# Patient Record
Sex: Female | Born: 1940 | Race: White | Hispanic: No | Marital: Married | Smoking: Never smoker
Health system: Southern US, Community
[De-identification: ages and names within clinical notes are randomized; demographics above are authoritative.]

## PROBLEM LIST (undated history)

## (undated) DIAGNOSIS — T4145XA Adverse effect of unspecified anesthetic, initial encounter: Secondary | ICD-10-CM

## (undated) DIAGNOSIS — T8859XA Other complications of anesthesia, initial encounter: Secondary | ICD-10-CM

## (undated) DIAGNOSIS — M1990 Unspecified osteoarthritis, unspecified site: Secondary | ICD-10-CM

## (undated) DIAGNOSIS — I10 Essential (primary) hypertension: Secondary | ICD-10-CM

## (undated) DIAGNOSIS — C50919 Malignant neoplasm of unspecified site of unspecified female breast: Secondary | ICD-10-CM

## (undated) DIAGNOSIS — H9190 Unspecified hearing loss, unspecified ear: Secondary | ICD-10-CM

## (undated) DIAGNOSIS — R519 Headache, unspecified: Secondary | ICD-10-CM

## (undated) DIAGNOSIS — M8580 Other specified disorders of bone density and structure, unspecified site: Secondary | ICD-10-CM

## (undated) HISTORY — DX: Other specified disorders of bone density and structure, unspecified site: M85.80

## (undated) HISTORY — PX: PROCTOSCOPY: SHX2266

## (undated) HISTORY — PX: CATARACT EXTRACTION: SUR2

## (undated) HISTORY — DX: Malignant neoplasm of unspecified site of unspecified female breast: C50.919

## (undated) HISTORY — PX: OTHER SURGICAL HISTORY: SHX169

## (undated) HISTORY — PX: HYSTEROSCOPY WITH D & C: SHX1775

## (undated) HISTORY — PX: COLONOSCOPY: SHX174

## (undated) HISTORY — DX: Essential (primary) hypertension: I10

---

## 1998-11-14 ENCOUNTER — Other Ambulatory Visit: Admission: RE | Admit: 1998-11-14 | Discharge: 1998-11-14 | Payer: Self-pay | Admitting: Internal Medicine

## 1999-01-01 ENCOUNTER — Encounter: Payer: Self-pay | Admitting: Internal Medicine

## 1999-01-01 ENCOUNTER — Encounter: Admission: RE | Admit: 1999-01-01 | Discharge: 1999-01-01 | Payer: Self-pay | Admitting: Internal Medicine

## 2000-02-18 ENCOUNTER — Other Ambulatory Visit: Admission: RE | Admit: 2000-02-18 | Discharge: 2000-02-18 | Payer: Self-pay | Admitting: Internal Medicine

## 2000-07-24 ENCOUNTER — Encounter: Admission: RE | Admit: 2000-07-24 | Discharge: 2000-07-24 | Payer: Self-pay | Admitting: Internal Medicine

## 2000-07-24 ENCOUNTER — Encounter: Payer: Self-pay | Admitting: Internal Medicine

## 2001-03-24 ENCOUNTER — Other Ambulatory Visit: Admission: RE | Admit: 2001-03-24 | Discharge: 2001-03-24 | Payer: Self-pay | Admitting: Internal Medicine

## 2002-03-10 ENCOUNTER — Encounter: Payer: Self-pay | Admitting: Internal Medicine

## 2002-03-10 ENCOUNTER — Encounter: Admission: RE | Admit: 2002-03-10 | Discharge: 2002-03-10 | Payer: Self-pay | Admitting: Internal Medicine

## 2002-03-30 ENCOUNTER — Other Ambulatory Visit: Admission: RE | Admit: 2002-03-30 | Discharge: 2002-03-30 | Payer: Self-pay | Admitting: Internal Medicine

## 2003-02-05 HISTORY — PX: BREAST SURGERY: SHX581

## 2003-02-05 LAB — CONVERTED CEMR LAB: Pap Smear: NORMAL

## 2003-03-17 ENCOUNTER — Encounter: Admission: RE | Admit: 2003-03-17 | Discharge: 2003-03-17 | Payer: Self-pay | Admitting: Internal Medicine

## 2003-03-25 ENCOUNTER — Encounter (INDEPENDENT_AMBULATORY_CARE_PROVIDER_SITE_OTHER): Payer: Self-pay | Admitting: Specialist

## 2003-03-25 ENCOUNTER — Encounter: Admission: RE | Admit: 2003-03-25 | Discharge: 2003-03-25 | Payer: Self-pay | Admitting: Internal Medicine

## 2003-03-31 ENCOUNTER — Other Ambulatory Visit: Admission: RE | Admit: 2003-03-31 | Discharge: 2003-03-31 | Payer: Self-pay | Admitting: Internal Medicine

## 2003-04-05 DIAGNOSIS — C50919 Malignant neoplasm of unspecified site of unspecified female breast: Secondary | ICD-10-CM

## 2003-04-05 HISTORY — DX: Malignant neoplasm of unspecified site of unspecified female breast: C50.919

## 2003-04-08 ENCOUNTER — Encounter (HOSPITAL_COMMUNITY): Admission: RE | Admit: 2003-04-08 | Discharge: 2003-07-07 | Payer: Self-pay | Admitting: General Surgery

## 2003-04-15 ENCOUNTER — Encounter: Admission: RE | Admit: 2003-04-15 | Discharge: 2003-04-15 | Payer: Self-pay | Admitting: General Surgery

## 2003-04-18 ENCOUNTER — Encounter: Admission: RE | Admit: 2003-04-18 | Discharge: 2003-04-18 | Payer: Self-pay | Admitting: General Surgery

## 2003-04-18 ENCOUNTER — Ambulatory Visit (HOSPITAL_COMMUNITY): Admission: RE | Admit: 2003-04-18 | Discharge: 2003-04-18 | Payer: Self-pay | Admitting: General Surgery

## 2003-04-18 ENCOUNTER — Ambulatory Visit (HOSPITAL_BASED_OUTPATIENT_CLINIC_OR_DEPARTMENT_OTHER): Admission: RE | Admit: 2003-04-18 | Discharge: 2003-04-18 | Payer: Self-pay | Admitting: General Surgery

## 2003-04-18 ENCOUNTER — Encounter (INDEPENDENT_AMBULATORY_CARE_PROVIDER_SITE_OTHER): Payer: Self-pay | Admitting: Specialist

## 2003-05-10 ENCOUNTER — Ambulatory Visit: Admission: RE | Admit: 2003-05-10 | Discharge: 2003-07-08 | Payer: Self-pay | Admitting: Radiation Oncology

## 2003-05-13 ENCOUNTER — Encounter: Admission: RE | Admit: 2003-05-13 | Discharge: 2003-05-13 | Payer: Self-pay | Admitting: Oncology

## 2003-08-25 ENCOUNTER — Ambulatory Visit: Admission: RE | Admit: 2003-08-25 | Discharge: 2003-08-25 | Payer: Self-pay | Admitting: Radiation Oncology

## 2003-09-22 ENCOUNTER — Ambulatory Visit: Admission: RE | Admit: 2003-09-22 | Discharge: 2003-09-22 | Payer: Self-pay | Admitting: Radiation Oncology

## 2003-12-13 ENCOUNTER — Ambulatory Visit: Payer: Self-pay | Admitting: Oncology

## 2004-03-19 ENCOUNTER — Encounter: Admission: RE | Admit: 2004-03-19 | Discharge: 2004-03-19 | Payer: Self-pay | Admitting: Oncology

## 2004-06-13 ENCOUNTER — Ambulatory Visit: Payer: Self-pay | Admitting: Oncology

## 2004-06-30 ENCOUNTER — Emergency Department (HOSPITAL_COMMUNITY): Admission: EM | Admit: 2004-06-30 | Discharge: 2004-06-30 | Payer: Self-pay | Admitting: Emergency Medicine

## 2004-12-12 ENCOUNTER — Ambulatory Visit: Payer: Self-pay | Admitting: Oncology

## 2005-01-16 ENCOUNTER — Encounter: Payer: Self-pay | Admitting: Family Medicine

## 2005-02-04 LAB — CONVERTED CEMR LAB: Pap Smear: NORMAL

## 2005-03-21 ENCOUNTER — Encounter: Admission: RE | Admit: 2005-03-21 | Discharge: 2005-03-21 | Payer: Self-pay | Admitting: Oncology

## 2005-05-15 ENCOUNTER — Other Ambulatory Visit: Admission: RE | Admit: 2005-05-15 | Discharge: 2005-05-15 | Payer: Self-pay | Admitting: Internal Medicine

## 2005-05-16 ENCOUNTER — Encounter: Admission: RE | Admit: 2005-05-16 | Discharge: 2005-05-16 | Payer: Self-pay | Admitting: Internal Medicine

## 2005-05-17 ENCOUNTER — Encounter: Admission: RE | Admit: 2005-05-17 | Discharge: 2005-05-17 | Payer: Self-pay | Admitting: Internal Medicine

## 2005-06-11 ENCOUNTER — Ambulatory Visit: Payer: Self-pay | Admitting: Oncology

## 2005-06-14 LAB — CBC WITH DIFFERENTIAL/PLATELET
BASO%: 0.4 % (ref 0.0–2.0)
Basophils Absolute: 0 10*3/uL (ref 0.0–0.1)
Eosinophils Absolute: 0.2 10*3/uL (ref 0.0–0.5)
HCT: 42 % (ref 34.8–46.6)
HGB: 14.1 g/dL (ref 11.6–15.9)
LYMPH%: 35.8 % (ref 14.0–48.0)
MCHC: 33.5 g/dL (ref 32.0–36.0)
MONO#: 0.5 10*3/uL (ref 0.1–0.9)
NEUT%: 51.6 % (ref 39.6–76.8)
Platelets: 220 10*3/uL (ref 145–400)
WBC: 5.6 10*3/uL (ref 3.9–10.0)
lymph#: 2 10*3/uL (ref 0.9–3.3)

## 2005-06-14 LAB — COMPREHENSIVE METABOLIC PANEL
ALT: 21 U/L (ref 0–40)
AST: 20 U/L (ref 0–37)
Albumin: 4.2 g/dL (ref 3.5–5.2)
BUN: 22 mg/dL (ref 6–23)
Calcium: 9.3 mg/dL (ref 8.4–10.5)
Chloride: 108 mEq/L (ref 96–112)
Potassium: 5.2 mEq/L (ref 3.5–5.3)

## 2005-06-18 ENCOUNTER — Encounter: Admission: RE | Admit: 2005-06-18 | Discharge: 2005-06-18 | Payer: Self-pay | Admitting: Oncology

## 2005-06-21 ENCOUNTER — Ambulatory Visit: Payer: Self-pay | Admitting: Internal Medicine

## 2005-06-24 ENCOUNTER — Ambulatory Visit: Payer: Self-pay | Admitting: Internal Medicine

## 2005-07-26 ENCOUNTER — Encounter (INDEPENDENT_AMBULATORY_CARE_PROVIDER_SITE_OTHER): Payer: Self-pay | Admitting: Specialist

## 2005-07-26 ENCOUNTER — Ambulatory Visit (HOSPITAL_COMMUNITY): Admission: RE | Admit: 2005-07-26 | Discharge: 2005-07-26 | Payer: Self-pay | Admitting: Obstetrics and Gynecology

## 2005-08-21 ENCOUNTER — Ambulatory Visit: Admission: RE | Admit: 2005-08-21 | Discharge: 2005-08-21 | Payer: Self-pay | Admitting: Gynecologic Oncology

## 2005-10-08 ENCOUNTER — Ambulatory Visit: Admission: RE | Admit: 2005-10-08 | Discharge: 2005-10-08 | Payer: Self-pay | Admitting: Gynecologic Oncology

## 2005-10-10 ENCOUNTER — Ambulatory Visit (HOSPITAL_COMMUNITY): Admission: RE | Admit: 2005-10-10 | Discharge: 2005-10-10 | Payer: Self-pay | Admitting: Gynecologic Oncology

## 2005-12-06 ENCOUNTER — Ambulatory Visit: Payer: Self-pay | Admitting: Oncology

## 2005-12-10 LAB — COMPREHENSIVE METABOLIC PANEL
ALT: 17 U/L (ref 0–35)
AST: 20 U/L (ref 0–37)
CO2: 25 mEq/L (ref 19–32)
Chloride: 106 mEq/L (ref 96–112)
Sodium: 140 mEq/L (ref 135–145)
Total Bilirubin: 0.5 mg/dL (ref 0.3–1.2)
Total Protein: 6.5 g/dL (ref 6.0–8.3)

## 2005-12-10 LAB — CBC WITH DIFFERENTIAL/PLATELET
BASO%: 0.7 % (ref 0.0–2.0)
EOS%: 2 % (ref 0.0–7.0)
MCH: 29.6 pg (ref 26.0–34.0)
MCHC: 34.5 g/dL (ref 32.0–36.0)
MONO#: 0.5 10*3/uL (ref 0.1–0.9)
RBC: 4.78 10*6/uL (ref 3.70–5.32)
WBC: 6.2 10*3/uL (ref 3.9–10.0)
lymph#: 1.2 10*3/uL (ref 0.9–3.3)

## 2005-12-10 LAB — CANCER ANTIGEN 27.29: CA 27.29: 27 U/mL (ref 0–39)

## 2006-03-24 ENCOUNTER — Encounter: Admission: RE | Admit: 2006-03-24 | Discharge: 2006-03-24 | Payer: Self-pay | Admitting: Oncology

## 2006-06-12 ENCOUNTER — Ambulatory Visit: Payer: Self-pay | Admitting: Oncology

## 2006-06-16 LAB — CBC WITH DIFFERENTIAL/PLATELET
BASO%: 0.5 % (ref 0.0–2.0)
EOS%: 1.9 % (ref 0.0–7.0)
HGB: 14.2 g/dL (ref 11.6–15.9)
MCH: 29.1 pg (ref 26.0–34.0)
MCHC: 34.7 g/dL (ref 32.0–36.0)
RBC: 4.86 10*6/uL (ref 3.70–5.32)
RDW: 13.2 % (ref 11.3–14.5)
lymph#: 1.4 10*3/uL (ref 0.9–3.3)

## 2006-06-16 LAB — COMPREHENSIVE METABOLIC PANEL
ALT: 17 U/L (ref 0–35)
AST: 21 U/L (ref 0–37)
Albumin: 4.2 g/dL (ref 3.5–5.2)
Calcium: 9.4 mg/dL (ref 8.4–10.5)
Chloride: 105 mEq/L (ref 96–112)
Potassium: 4.2 mEq/L (ref 3.5–5.3)
Sodium: 141 mEq/L (ref 135–145)

## 2006-10-10 ENCOUNTER — Ambulatory Visit: Payer: Self-pay | Admitting: Family Medicine

## 2006-10-10 DIAGNOSIS — I10 Essential (primary) hypertension: Secondary | ICD-10-CM | POA: Insufficient documentation

## 2006-10-10 DIAGNOSIS — Z853 Personal history of malignant neoplasm of breast: Secondary | ICD-10-CM

## 2006-10-10 DIAGNOSIS — K644 Residual hemorrhoidal skin tags: Secondary | ICD-10-CM | POA: Insufficient documentation

## 2006-10-10 DIAGNOSIS — M8580 Other specified disorders of bone density and structure, unspecified site: Secondary | ICD-10-CM

## 2006-10-16 ENCOUNTER — Encounter: Payer: Self-pay | Admitting: Family Medicine

## 2006-12-01 ENCOUNTER — Telehealth: Payer: Self-pay | Admitting: Family Medicine

## 2006-12-05 ENCOUNTER — Ambulatory Visit: Payer: Self-pay | Admitting: Oncology

## 2006-12-09 ENCOUNTER — Encounter: Admission: RE | Admit: 2006-12-09 | Discharge: 2006-12-09 | Payer: Self-pay | Admitting: Oncology

## 2006-12-09 LAB — CBC WITH DIFFERENTIAL/PLATELET
BASO%: 0.6 % (ref 0.0–2.0)
EOS%: 1.4 % (ref 0.0–7.0)
MCH: 29.7 pg (ref 26.0–34.0)
MCV: 84.6 fL (ref 81.0–101.0)
MONO%: 6.7 % (ref 0.0–13.0)
NEUT#: 5.5 10*3/uL (ref 1.5–6.5)
RBC: 4.66 10*6/uL (ref 3.70–5.32)
RDW: 12.9 % (ref 11.3–14.5)

## 2006-12-09 LAB — COMPREHENSIVE METABOLIC PANEL
AST: 26 U/L (ref 0–37)
Albumin: 4 g/dL (ref 3.5–5.2)
Alkaline Phosphatase: 45 U/L (ref 39–117)
Potassium: 3.9 mEq/L (ref 3.5–5.3)
Sodium: 141 mEq/L (ref 135–145)
Total Protein: 6.6 g/dL (ref 6.0–8.3)

## 2006-12-15 ENCOUNTER — Encounter: Payer: Self-pay | Admitting: Family Medicine

## 2007-02-17 ENCOUNTER — Ambulatory Visit: Payer: Self-pay | Admitting: Family Medicine

## 2007-02-24 DIAGNOSIS — E7800 Pure hypercholesterolemia, unspecified: Secondary | ICD-10-CM | POA: Insufficient documentation

## 2007-02-24 LAB — CONVERTED CEMR LAB
ALT: 34 units/L (ref 0–35)
AST: 32 units/L (ref 0–37)
Bilirubin, Direct: 0.1 mg/dL (ref 0.0–0.3)
CO2: 30 meq/L (ref 19–32)
Calcium: 9.2 mg/dL (ref 8.4–10.5)
Chloride: 106 meq/L (ref 96–112)
Cholesterol: 252 mg/dL (ref 0–200)
Creatinine, Ser: 0.7 mg/dL (ref 0.4–1.2)
Direct LDL: 179.9 mg/dL
GFR calc non Af Amer: 88 mL/min
Glucose, Bld: 100 mg/dL — ABNORMAL HIGH (ref 70–99)
Sodium: 143 meq/L (ref 135–145)
Total Bilirubin: 0.9 mg/dL (ref 0.3–1.2)
Total CHOL/HDL Ratio: 5.2
Total Protein: 6.6 g/dL (ref 6.0–8.3)

## 2007-03-23 ENCOUNTER — Ambulatory Visit: Payer: Self-pay | Admitting: Family Medicine

## 2007-03-24 ENCOUNTER — Ambulatory Visit: Payer: Self-pay | Admitting: Family Medicine

## 2007-03-26 ENCOUNTER — Encounter: Admission: RE | Admit: 2007-03-26 | Discharge: 2007-03-26 | Payer: Self-pay | Admitting: Oncology

## 2007-04-02 ENCOUNTER — Encounter: Admission: RE | Admit: 2007-04-02 | Discharge: 2007-04-02 | Payer: Self-pay | Admitting: Oncology

## 2007-04-21 ENCOUNTER — Ambulatory Visit: Payer: Self-pay | Admitting: Family Medicine

## 2007-04-21 ENCOUNTER — Telehealth: Payer: Self-pay | Admitting: Family Medicine

## 2007-04-21 DIAGNOSIS — N390 Urinary tract infection, site not specified: Secondary | ICD-10-CM

## 2007-04-21 LAB — CONVERTED CEMR LAB
Nitrite: NEGATIVE
Specific Gravity, Urine: 1.01
Urobilinogen, UA: 0.2
pH: 6

## 2007-05-05 ENCOUNTER — Ambulatory Visit: Payer: Self-pay | Admitting: Family Medicine

## 2007-05-05 LAB — CONVERTED CEMR LAB
Nitrite: NEGATIVE
Specific Gravity, Urine: 1.02
Urobilinogen, UA: 0.2
pH: 6.5

## 2007-05-12 ENCOUNTER — Ambulatory Visit: Payer: Self-pay | Admitting: Family Medicine

## 2007-05-18 ENCOUNTER — Ambulatory Visit: Payer: Self-pay | Admitting: Family Medicine

## 2007-05-18 DIAGNOSIS — N9089 Other specified noninflammatory disorders of vulva and perineum: Secondary | ICD-10-CM | POA: Insufficient documentation

## 2007-05-20 LAB — CONVERTED CEMR LAB
Cholesterol: 204 mg/dL (ref 0–200)
HDL: 37.8 mg/dL — ABNORMAL LOW (ref >39.0)
Triglycerides: 126 mg/dL (ref 0–149)
VLDL: 25 mg/dL (ref 0–40)

## 2007-05-27 ENCOUNTER — Ambulatory Visit: Payer: Self-pay | Admitting: Family Medicine

## 2007-05-27 LAB — CONVERTED CEMR LAB: Cholesterol, target level: 200 mg/dL

## 2007-06-22 ENCOUNTER — Ambulatory Visit: Payer: Self-pay | Admitting: Oncology

## 2007-07-02 ENCOUNTER — Encounter: Payer: Self-pay | Admitting: Family Medicine

## 2007-07-14 ENCOUNTER — Ambulatory Visit: Payer: Self-pay | Admitting: Family Medicine

## 2007-07-29 ENCOUNTER — Telehealth (INDEPENDENT_AMBULATORY_CARE_PROVIDER_SITE_OTHER): Payer: Self-pay | Admitting: *Deleted

## 2007-07-29 ENCOUNTER — Telehealth: Payer: Self-pay | Admitting: Internal Medicine

## 2007-07-31 ENCOUNTER — Telehealth: Payer: Self-pay | Admitting: Internal Medicine

## 2007-08-05 LAB — CONVERTED CEMR LAB: Pap Smear: NORMAL

## 2007-09-09 DIAGNOSIS — Z853 Personal history of malignant neoplasm of breast: Secondary | ICD-10-CM

## 2007-09-14 ENCOUNTER — Ambulatory Visit: Payer: Self-pay | Admitting: Internal Medicine

## 2007-09-14 DIAGNOSIS — R195 Other fecal abnormalities: Secondary | ICD-10-CM

## 2007-09-14 DIAGNOSIS — N824 Other female intestinal-genital tract fistulae: Secondary | ICD-10-CM

## 2007-10-19 ENCOUNTER — Telehealth: Payer: Self-pay | Admitting: Family Medicine

## 2007-10-21 ENCOUNTER — Ambulatory Visit: Admission: RE | Admit: 2007-10-21 | Discharge: 2007-10-21 | Payer: Self-pay | Admitting: Gynecology

## 2007-10-21 ENCOUNTER — Ambulatory Visit (HOSPITAL_COMMUNITY): Admission: RE | Admit: 2007-10-21 | Discharge: 2007-10-21 | Payer: Self-pay | Admitting: Gynecology

## 2008-01-04 ENCOUNTER — Ambulatory Visit: Payer: Self-pay | Admitting: Oncology

## 2008-01-06 ENCOUNTER — Encounter: Payer: Self-pay | Admitting: Family Medicine

## 2008-01-06 LAB — CBC WITH DIFFERENTIAL/PLATELET
Basophils Absolute: 0 10*3/uL (ref 0.0–0.1)
Eosinophils Absolute: 0.2 10*3/uL (ref 0.0–0.5)
HGB: 13.6 g/dL (ref 11.6–15.9)
LYMPH%: 23.7 % (ref 14.0–48.0)
MCV: 87.2 fL (ref 81.0–101.0)
MONO#: 0.6 10*3/uL (ref 0.1–0.9)
MONO%: 7.1 % (ref 0.0–13.0)
NEUT#: 5.3 10*3/uL (ref 1.5–6.5)
Platelets: 190 10*3/uL (ref 145–400)
RDW: 12.8 % (ref 11.3–14.5)
WBC: 8 10*3/uL (ref 3.9–10.0)

## 2008-01-07 LAB — COMPREHENSIVE METABOLIC PANEL
Albumin: 4.2 g/dL (ref 3.5–5.2)
Alkaline Phosphatase: 43 U/L (ref 39–117)
BUN: 16 mg/dL (ref 6–23)
CO2: 26 mEq/L (ref 19–32)
Glucose, Bld: 97 mg/dL (ref 70–99)
Potassium: 4 mEq/L (ref 3.5–5.3)
Sodium: 139 mEq/L (ref 135–145)
Total Protein: 6.6 g/dL (ref 6.0–8.3)

## 2008-01-07 LAB — LACTATE DEHYDROGENASE: LDH: 183 U/L (ref 94–250)

## 2008-01-07 LAB — VITAMIN D 25 HYDROXY (VIT D DEFICIENCY, FRACTURES): Vit D, 25-Hydroxy: 45 ng/mL (ref 30–89)

## 2008-01-07 LAB — CANCER ANTIGEN 27.29: CA 27.29: 25 U/mL (ref 0–39)

## 2008-03-28 ENCOUNTER — Encounter: Admission: RE | Admit: 2008-03-28 | Discharge: 2008-03-28 | Payer: Self-pay | Admitting: Oncology

## 2008-07-19 ENCOUNTER — Ambulatory Visit: Payer: Self-pay | Admitting: Oncology

## 2008-07-21 LAB — CBC WITH DIFFERENTIAL/PLATELET
BASO%: 0.3 % (ref 0.0–2.0)
Basophils Absolute: 0 10*3/uL (ref 0.0–0.1)
Eosinophils Absolute: 0.1 10*3/uL (ref 0.0–0.5)
HCT: 39 % (ref 34.8–46.6)
HGB: 13.8 g/dL (ref 11.6–15.9)
MONO#: 0.6 10*3/uL (ref 0.1–0.9)
NEUT#: 4.4 10*3/uL (ref 1.5–6.5)
NEUT%: 59.7 % (ref 38.4–76.8)
WBC: 7.3 10*3/uL (ref 3.9–10.3)
lymph#: 2.2 10*3/uL (ref 0.9–3.3)

## 2008-07-22 LAB — COMPREHENSIVE METABOLIC PANEL
ALT: 15 U/L (ref 0–35)
CO2: 25 mEq/L (ref 19–32)
Calcium: 9.1 mg/dL (ref 8.4–10.5)
Chloride: 106 mEq/L (ref 96–112)
Creatinine, Ser: 0.81 mg/dL (ref 0.40–1.20)

## 2008-07-22 LAB — LACTATE DEHYDROGENASE: LDH: 167 U/L (ref 94–250)

## 2008-07-26 ENCOUNTER — Encounter: Payer: Self-pay | Admitting: Family Medicine

## 2008-08-02 ENCOUNTER — Telehealth (INDEPENDENT_AMBULATORY_CARE_PROVIDER_SITE_OTHER): Payer: Self-pay | Admitting: *Deleted

## 2008-08-02 ENCOUNTER — Ambulatory Visit: Payer: Self-pay | Admitting: Family Medicine

## 2008-08-02 DIAGNOSIS — R300 Dysuria: Secondary | ICD-10-CM

## 2008-08-02 LAB — CONVERTED CEMR LAB
Bilirubin Urine: NEGATIVE
Glucose, Urine, Semiquant: NEGATIVE
Ketones, urine, test strip: NEGATIVE
Protein, U semiquant: NEGATIVE
Specific Gravity, Urine: 1.015

## 2008-08-12 ENCOUNTER — Ambulatory Visit: Payer: Self-pay | Admitting: Family Medicine

## 2008-08-16 LAB — CONVERTED CEMR LAB: Vit D, 25-Hydroxy: 43 ng/mL (ref 30–89)

## 2008-08-19 LAB — CONVERTED CEMR LAB
BUN: 18 mg/dL (ref 6–23)
Cholesterol: 209 mg/dL — ABNORMAL HIGH (ref 0–200)
Creatinine, Ser: 0.8 mg/dL (ref 0.4–1.2)
Direct LDL: 148.8 mg/dL
GFR calc non Af Amer: 75.3 mL/min (ref >60)
HDL: 48.5 mg/dL (ref >39.00)
Potassium: 4 meq/L (ref 3.5–5.1)
Total Bilirubin: 1 mg/dL (ref 0.3–1.2)
Total CHOL/HDL Ratio: 4
VLDL: 15.6 mg/dL (ref 0.0–40.0)

## 2008-11-14 ENCOUNTER — Ambulatory Visit: Payer: Self-pay | Admitting: Family Medicine

## 2008-11-15 LAB — CONVERTED CEMR LAB
Cholesterol: 193 mg/dL (ref 0–200)
HDL: 45.2 mg/dL (ref >39.00)
Triglycerides: 109 mg/dL (ref 0.0–149.0)

## 2008-12-12 ENCOUNTER — Encounter: Admission: RE | Admit: 2008-12-12 | Discharge: 2008-12-12 | Payer: Self-pay | Admitting: Oncology

## 2009-03-16 LAB — HM MAMMOGRAPHY: HM Mammogram: NORMAL

## 2009-03-29 ENCOUNTER — Encounter: Admission: RE | Admit: 2009-03-29 | Discharge: 2009-03-29 | Payer: Self-pay | Admitting: Oncology

## 2009-05-15 ENCOUNTER — Telehealth: Payer: Self-pay | Admitting: Family Medicine

## 2009-06-08 ENCOUNTER — Telehealth: Payer: Self-pay | Admitting: Family Medicine

## 2009-06-09 ENCOUNTER — Ambulatory Visit: Payer: Self-pay | Admitting: Family Medicine

## 2009-06-16 DIAGNOSIS — R740 Nonspecific elevation of levels of transaminase and lactic acid dehydrogenase [LDH]: Secondary | ICD-10-CM

## 2009-06-16 LAB — CONVERTED CEMR LAB
ALT: 54 units/L — ABNORMAL HIGH (ref 0–35)
Direct LDL: 142.2 mg/dL
HDL: 51.9 mg/dL (ref >39.00)
Total CHOL/HDL Ratio: 4
Triglycerides: 75 mg/dL (ref 0.0–149.0)
VLDL: 15 mg/dL (ref 0.0–40.0)

## 2009-07-12 ENCOUNTER — Ambulatory Visit: Payer: Self-pay | Admitting: Family Medicine

## 2009-07-17 ENCOUNTER — Ambulatory Visit: Payer: Self-pay | Admitting: Oncology

## 2009-07-19 LAB — CBC WITH DIFFERENTIAL/PLATELET
BASO%: 0.4 % (ref 0.0–2.0)
LYMPH%: 25.4 % (ref 14.0–49.7)
MCHC: 34.5 g/dL (ref 31.5–36.0)
MONO#: 0.5 10*3/uL (ref 0.1–0.9)
MONO%: 7 % (ref 0.0–14.0)
Platelets: 172 10*3/uL (ref 145–400)
RBC: 4.51 10*6/uL (ref 3.70–5.45)
RDW: 13 % (ref 11.2–14.5)
WBC: 7.6 10*3/uL (ref 3.9–10.3)

## 2009-07-20 LAB — COMPREHENSIVE METABOLIC PANEL
ALT: 14 U/L (ref 0–35)
Alkaline Phosphatase: 47 U/L (ref 39–117)
Sodium: 140 mEq/L (ref 135–145)
Total Bilirubin: 0.4 mg/dL (ref 0.3–1.2)
Total Protein: 6.3 g/dL (ref 6.0–8.3)

## 2009-08-02 ENCOUNTER — Ambulatory Visit: Payer: Self-pay | Admitting: Family Medicine

## 2009-08-09 LAB — CONVERTED CEMR LAB
AST: 26 units/L (ref 0–37)
Albumin: 4 g/dL (ref 3.5–5.2)
Alkaline Phosphatase: 40 units/L (ref 39–117)
HCV Ab: NEGATIVE
Total Protein: 6.7 g/dL (ref 6.0–8.3)

## 2009-09-08 ENCOUNTER — Ambulatory Visit: Payer: Self-pay | Admitting: Oncology

## 2009-09-12 ENCOUNTER — Encounter: Payer: Self-pay | Admitting: Family Medicine

## 2010-02-24 ENCOUNTER — Other Ambulatory Visit: Payer: Self-pay | Admitting: Oncology

## 2010-02-24 DIAGNOSIS — Z9889 Other specified postprocedural states: Secondary | ICD-10-CM

## 2010-03-08 NOTE — Letter (Signed)
Summary: [facility]  [facility]   Imported By: Lanelle Bal 10/17/2009 12:50:01  _____________________________________________________________________  External Attachment:    Type:   Image     Comment:   External Document

## 2010-03-08 NOTE — Assessment & Plan Note (Signed)
Summary: CPX   Vital Signs:  Patient profile:   70 year old female Height:      61.25 inches Weight:      116.8 pounds BMI:     21.97 Temp:     97.9 degrees F oral Pulse rate:   76 / minute Pulse rhythm:   regular BP sitting:   110 / 78  (left arm) Cuff size:   regular  Vitals Entered By: Benny Lennert CMA Duncan Dull) (July 12, 2009 8:09 AM)  History of Present Illness: Chief complaint chronic health management  Elevated liver transaminases, mild:  Was on OTC  statin medicaiton.  Minimal alcohol,   Needs refills of BP medicaiton.  Has had breast exam at Dr. Bernadette Hoit....has appt this month.  6 years breast cancer free.   Nml EKG in 2009.   Hypertension History:      She denies headache, chest pain, palpitations, dyspnea with exertion, peripheral edema, syncope, and side effects from treatment.  Well controlled at home on atenolol when she takes it.  112/68-141/80.        Positive major cardiovascular risk factors include female age 79 years old or older, hyperlipidemia, and hypertension.  Negative major cardiovascular risk factors include non-tobacco-user status.    Lipid Management History:      Positive NCEP/ATP III risk factors include female age 91 years old or older and hypertension.  Negative NCEP/ATP III risk factors include non-tobacco-user status.        The patient states that she knows about the "Therapeutic Lifestyle Change" diet.  Her compliance with the TLC diet is fair.  The patient expresses understanding of adjunctive measures for cholesterol lowering.  Adjunctive measures started by the patient include aerobic exercise, fiber, omega-3 supplements, limit alcohol consumpton, and weight reduction.  She expresses no side effects from her lipid-lowering medication.  The patient denies any symptoms to suggest myopathy or liver disease.  Comments: HAs not been taking red yeast rice regualrly anymore, not exercsiing and eating as well as she should. .    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 1 1/2  By Mouth Once Daily 2)  Adult Aspirin Ec Low Strength 81 Mg  Tbec (Aspirin) .Marland Kitchen.. 1 By Mouth Once Daily 3)  Fish Oil   Oil (Fish Oil) 1700 Mg. Marland Kitchen... 1 By Mouth Two Times A Day 4)  Multivitamins   Tabs (Multiple Vitamin) .Marland Kitchen.. 1 By Mouth Once Daily Q 5)  Caltrate 600+d 600-400 Mg-Unit  Tabs (Calcium Carbonate-Vitamin D) .Marland Kitchen.. 1 By Mouth Two Times A Day 6)  Vitamin D 400 Unit  Tabs (Cholecalciferol) .... Take 1 Tablet By Mouth Two Times A Day 7)  Turmeric Curcumin   Caps (Misc Natural Products) .... Take 1 Capsule By Mouth Two Times A Day 8)  Anamantle Hc 3-0.5 %  Crea (Lidocaine-Hydrocortisone Ace) .... Use Twice Daily For Hemorrhoidal Pain. 9)  Alendronate Sodium 70 Mg Tabs (Alendronate Sodium) .... Take 1 Tablet By  Mouth 30 Minutes Before Breakfast Once A Week With At Least 8 Ounces of Water.  Allergies: 1)  ! Penicillin V Potassium (Penicillin V Potassium)  Past History:  Past medical, surgical, family and social histories (including risk factors) reviewed, and no changes noted (except as noted below).  Past Medical History: Reviewed history from 10/10/2006 and no changes required. Hypertension Osteopenia  Past Surgical History: Reviewed history from 09/16/2007 and no changes required. Bone density 2005 colonoscopy 2007 lumpectomy 2005 bone scan 2007 stapendectomy B hx of episiotomy Hysteroscopy,  proctoscopy, dilation and curettage, biopsy rectovaginal mass, repair proctostomy June 2007  Family History: Reviewed history from 09/14/2007 and no changes required. father died 13 cerebral hemmorhage mother died 57 7 siblings:  1 with alcoholism: esophegeal cancer  1with lung cancer 1 with RA, PArkinsons, TIA, CAD  No FH of Colon Cancer: No history of inflammatory bowel disease  Social History: Reviewed history from 10/10/2006 and no changes required. Occupation:retired manufatures rep Married Never Smoked Alcohol use-no Drug use-no Regular exercise-no, occ goes to [employer] Diet: healthy, friuits and veggies  Review of Systems General:  Denies fatigue and fever. CV:  Denies chest pain or discomfort. Resp:  Denies shortness of breath. GI:  Denies abdominal pain, bloody stools, constipation, and diarrhea. GU:  Denies dysuria. Derm:  Denies lesion(s) and rash; Sees Dr. Ferdinand Cava..plans on a visit soon. Marland Kitchen  Physical Exam  General:  elderly female in NAd Eyes:  No corneal or conjunctival inflammation noted. EOMI. Perrla. Funduscopic exam benign, without hemorrhages, exudates or papilledema. Vision grossly normal. Ears:  External ear exam shows no significant lesions or deformities.  Otoscopic examination reveals clear canals, tympanic membranes are intact bilaterally without bulging, retraction, inflammation or discharge. Hearing is grossly normal bilaterally. Nose:  External nasal examination shows no deformity or inflammation. Nasal mucosa are pink and moist without lesions or exudates. Mouth:  Oral mucosa and oropharynx without lesions or exudates.  Teeth in good repair. Neck:  no carotid bruit or thyromegaly no cervical or supraclavicular lymphadenopathy  Breasts:  per ONC Lungs:  Normal respiratory effort, chest expands symmetrically. Lungs are clear to auscultation, no crackles or wheezes. Heart:  Normal rate and regular rhythm. S1 and S2 normal without gallop, murmur, click,  rub or other extra sounds. Abdomen:  Bowel sounds positive,abdomen soft and non-tender without masses, organomegaly or hernias noted. Rectal:  no external abnormalities.   Genitalia:  Pelvic Exam:        External: normal female genitalia without lesions or masses        Vagina: normal without lesions or masses        Cervix: normal without lesions or masses        Adnexa: normal bimanual exam without masses or fullness        Uterus: normal by palpation        Pap smear: not performed Msk:  No deformity or scoliosis noted of thoracic or lumbar spine.   Pulses:  R and L posterior tibial pulses are full and equal bilaterally  Extremities:  no edema Neurologic:  No cranial nerve deficits noted. Station and gait are normal. Plantar reflexes are down-going bilaterally. DTRs are symmetrical throughout. Sensory, motor and coordinative functions appear intact. Skin:  Intact without suspicious lesions or rashes Psych:  Cognition and judgment appear intact. Alert and cooperative with normal attention span and concentration. No apparent delusions, illusions, hallucinations   Impression & Recommendations:  Problem # 1:  TRANSAMINASES, SERUM, ELEVATED (ICD-790.4) No clear cause other  than red yeast rice, Stop ETOH and any tylenol/ibuprofen.  Recheck in [redacted] weeks along with hepatitis panel.   Problem # 2:  HYPERCHOLESTEROLEMIA (ICD-272.0)  Inadequate control..Encouraged exercise, weight loss, healthy eating habits. She has now restarted red yeast rice.   Labs Reviewed: SGOT: 41 (06/09/2009)   SGPT: 54 (06/09/2009)  Lipid Goals: Chol Goal: 200 (05/27/2007)   HDL Goal: 40 (05/27/2007)   LDL Goal: 130 (05/27/2007)   TG Goal: 150 (05/27/2007)  10 Yr Risk Heart Disease: 6 % Prior 10 Yr Risk Heart Disease: Not enough information (05/27/2007)   HDL:51.90 (06/09/2009), 45.20 (11/14/2008)  LDL:126 (11/14/2008), DEL (05/12/2007)  Chol:203 (06/09/2009), 193 (11/14/2008)  Trig:75.0 (06/09/2009), 109.0  (11/14/2008)  Problem # 3:  HYPERTENSION (ICD-401.9)  Well controlled. Continue current medication.  Her updated medication list for this problem includes:    Atenolol 50 Mg Tabs (Atenolol) .Marland Kitchen... 1 1/2  by mouth once daily  BP today: 110/78 Prior BP: 162/86 (08/02/2008)  10 Yr Risk Heart Disease: 6 % Prior 10 Yr Risk Heart Disease: Not enough information (05/27/2007)  Labs Reviewed: K+: 4.0 (08/12/2008) Creat: : 0.8 (08/12/2008)   Chol: 203 (06/09/2009)   HDL: 51.90 (06/09/2009)   LDL: 126 (11/14/2008)   TG: 75.0 (06/09/2009)  Problem # 4:  ADENOCARCINOMA, BREAST (ICD-174.9) per Dr. Donnie Coffin.   Problem # 5:  Preventive Health Care (ICD-V70.0) Assessment: Comment Only Reviewed preventive care protocols, scheduled due services, and updated immunizations.   Complete Medication List: 1)  Atenolol 50 Mg Tabs (Atenolol) .Marland Kitchen.. 1 1/2  by mouth once daily 2)  Adult Aspirin Ec Low Strength 81 Mg Tbec (Aspirin) .Marland Kitchen.. 1 by mouth once daily 3)  Fish Oil Oil (fish Oil) 1700 Mg.  Marland Kitchen... 1 by mouth two times a day 4)  Multivitamins Tabs (Multiple vitamin) .Marland Kitchen.. 1 by mouth once daily q 5)  Caltrate 600+d 600-400 Mg-unit Tabs (Calcium carbonate-vitamin d) .Marland Kitchen.. 1 by mouth two times a day 6)  Vitamin D 400 Unit Tabs (Cholecalciferol) .... Take 1 tablet by mouth two times a day 7)  Turmeric Curcumin Caps (Misc natural products) .... Take 1 capsule by mouth two times a day 8)  Anamantle Hc 3-0.5 % Crea (Lidocaine-hydrocortisone ace) .... Use twice daily for hemorrhoidal pain. 9)  Alendronate Sodium 70 Mg Tabs (Alendronate sodium) .... Take 1 tablet by mouth 30 minutes before breakfast once a week with at least 8 ounces of water.  Hypertension Assessment/Plan:      The patient's hypertensive risk group is category B: At least one risk factor (excluding diabetes) with no target organ damage.  Her calculated 10 year risk of coronary heart disease is 6 %.  Today's blood pressure is 110/78.  Her blood pressure  goal is < 140/90.  Lipid Assessment/Plan:      Based on NCEP/ATP III, the patient's risk factor category is "2 or more risk factors and a calculated 10 year CAD risk of < 20%".  The patient's lipid goals are as follows: Total cholesterol goal is 200; LDL cholesterol goal is 130; HDL cholesterol goal is 40; Triglyceride goal is 150.  Her LDL cholesterol goal has not been met.    Patient Instructions: 1)  Return to healthy eating habits, exercsie and red yeast rice daily.  2)  Hold tylenol, OTC meds and alcohol... 3)  Return for hepatic panel and acute hepatitis in 2 weeks Dx 790.4 4)  Send records of mammogram and bone density from Dr. Donnie Coffin at upcoming appt.  5)  Labs Fasting  in 6 months....LIPIDs and CMET. 6)  Hepatic Panel prior to visit ICD-9:  7)  Lipid panel prior to visit ICD-9 :  Prescriptions: ALENDRONATE SODIUM 70 MG TABS (ALENDRONATE SODIUM) Take 1 tablet by mouth 30 minutes before breakfast once a week with at least 8 ounces of water.  #4 x 11   Entered and Authorized by:   Kerby Nora MD   Signed by:   Kerby Nora MD on 07/12/2009   Method used:   Electronically to        [facility]* (retail)       [address]       [place], [place]  [address]       Ph: [phone]       Fax: [phone]   RxID:   [id number] ATENOLOL 50 MG  TABS (ATENOLOL) 1 1/2  by mouth once daily  #45 x 11   Entered and Authorized by:   Kerby Nora MD   Signed by:   Kerby Nora MD on 07/12/2009   Method used:   Electronically to        [facility]* (retail)       [address]       [place], [place]  [address]       Ph: [phone]       Fax: [phone]   RxID:   [id number]   Current Allergies (reviewed today): ! PENICILLIN V POTASSIUM (PENICILLIN V POTASSIUM)  Flex Sig Next Due:  Not Indicated Hemoccult Next Due:  Not Indicated Last Mammogram:  normal (03/07/2008 1:21:51 PM) Mammogram Result Date:  03/16/2009 Mammogram Result:  normal Mammogram Next Due:  1 yr Bone  Density Result Date:  01/04/2009

## 2010-03-08 NOTE — Progress Notes (Signed)
Summary: wants tsh  Phone Note Call from Patient Call back at Home Phone [phone]   Caller: Patient Call For: Kerby Nora MD Summary of Call: Patient is coming in on May 6th for lab appt. She wants to know if she can have her thyroid checked as well. She says that her nail have been more brittle and her hair is thinning. Please advise.  Initial call taken by: Melody Comas,  May 15, 2009 2:15 PM  Follow-up for Phone Call        Okay to add TSh Dx 780.79 Follow-up by: Kerby Nora MD,  May 16, 2009 9:08 AM  Additional Follow-up for Phone Call Additional follow up Details #1::        Added to labs scheduled for 06/09/2009. Additional Follow-up by: Linde Gillis CMA Duncan Dull),  May 16, 2009 9:23 AM

## 2010-03-08 NOTE — Progress Notes (Signed)
Summary: wants vitamin B labs checked  Phone Note Call from Patient Call back at Home Phone [phone]   Caller: Patient Summary of Call: Pt is coming in for [facility] tomorrow and is asking to have  B complex labs checked.  Please advise. Initial call taken by: Lowella Petties CMA,  Jun 08, 2009 2:09 PM  Follow-up for Phone Call        add b12, 780.79 Follow-up by: Hannah Beat MD,  Jun 08, 2009 2:42 PM  Additional Follow-up for Phone Call Additional follow up Details #1::        done Additional Follow-up by: Benny Lennert CMA Duncan Dull),  Jun 08, 2009 2:50 PM

## 2010-03-30 ENCOUNTER — Ambulatory Visit
Admission: RE | Admit: 2010-03-30 | Discharge: 2010-03-30 | Disposition: A | Discharge status: Home / Self Care | Payer: Medicare Other | Source: Ambulatory Visit | Attending: Oncology | Admitting: Oncology

## 2010-03-30 DIAGNOSIS — Z9889 Other specified postprocedural states: Secondary | ICD-10-CM

## 2010-06-19 NOTE — Group Therapy Note (Signed)
[name], [name]                ACCOUNT NO.:  [id number]   MEDICAL RECORD NO.:  [id number]          PATIENT TYPE:  OUT   LOCATION:  XRAY                         FACILITY[facility]  [facility]   PHYSICIAN:  De Blanch, M.D.DATE OF BIRTH:  1940-07-08                                 PROGRESS NOTE   CHIEF COMPLAINT:  Vaginal drainage.   INTERVAL HISTORY:  The patient was last seen a little over 2 years ago by Jonny Ruiz T. Kyla Balzarine,  M.D.  At that time there is a question of a rectovaginal fistula which  could not be confirmed.  The patient has continued to have some vaginal  discharge.  She denies any bleeding and notes that the discharge is  yellow and fairly minimal in that she only wears a panty liner.  It does  not appear to be stool and does not have an odor.  In addition, she does  not have any flatus from the vagina.  The discharge is painless.   HISTORY OF PRESENT ILLNESS:  The patient was found to have an indurated thickened posterior vaginal  wall and rectovaginal septum.  She underwent attempted removal in June  of 2007.  In the course of that procedure proctotomy was created and  repaired.  A piece of tissue was removed which did not reveal any  neoplastic process, but suggested an squamous epithelial lined tract  which might have represented a fistula.  My review of the records show  that there has never been any indication and any evidence that in fact  she did have a fistula.   PAST MEDICAL HISTORY:  1. Breast cancer, currently being treated with Arimidex now for nearly      3 years.  2. Hypertension.   PAST SURGICAL HISTORY:  Wide local excision of breast lesion.   CURRENT MEDICATIONS:  Arimidex atenolol, calcium and vitamin D.   DRUG ALLERGIES:  KEFLEX.   FAMILY HISTORY:  Negative for gynecologic breast or colon cancer.   REVIEW OF SYSTEMS:  The 10-point comprehensive review of systems negative, except as noted  above.   PHYSICAL EXAMINATION:  Weight 116  pounds, blood pressure 147/81.  GENERAL:  The patient is a healthy white female in no acute distress.  HEENT:  Negative.  NECK:  Supple without thyromegaly.  There is no supraclavicular or  inguinal adenopathy.  ABDOMEN:  Soft, nontender.  No mass, organomegaly, ascites or hernias  noted.  PELVIC EXAM:  EG/BUS, vagina, bladder and urethra are normal.  Careful inspection of the vagina shows an area which appears to be  scarred in the distal one third of the posterior vagina.  I palpated  this area and also tried to probe it with a wire probe and I am unable  to demonstrate any connection with the rectum or any fistulous or sinus  tract.  On bimanual examination there is thickening in the rectovaginal  septum above the scar which is which measures approximately 3 x 0.5 cm  thick.  The cervix is somewhat patulous with cervical mucous.  The  uterus is anterior, normal shape, size and consistency.  There  is no  adnexal masses noted.   IMPRESSION:  Vaginal discharge of questionable etiology.  I do not find enough  evidence to suggest a rectovaginal fistula and wonder whether the source  of her discharge is from the uterus and cervix and/or because of vaginal  atrophy.   Following seeing the patient in clinic, she was sent for an ultrasound  which showed some small uterine fibroids, normal endometrial stripe (5  mm) and right ovary that is normal and no free fluid in the pelvis.   Given this good ultrasound report and no significant evidence of a  uterine polyp or cervical polyp, the discharge may well be secondary to  the use of Arimidex and vaginal atrophy.  Therefore, I gave her a  prescription for Premarin vaginal cream to be used 0.5 gram three times  a week.  I believe the systemic absorption of this small amount of  Premarin is minimal and should not impact her breast cancer.  She will  return to the care of Randye Lobo, M.D. for continuing follow-up and  evaluation.       De Blanch, M.D.  Electronically Signed     DC/MEDQ  D:  10/21/2007  T:  10/23/2007  Job:  [id number]   cc:   Telford Nab, R.N.  [address]  [place], [place] [address]   Pierce Crane, MD  Fax: [phone]   Janae Bridgeman. Eloise Harman., M.D.  Fax: [phone]   Adolph Pollack, M.D.  [address]  [place]  [place] [address]   Randye Lobo, M.D.  Fax: [phone]

## 2010-06-22 NOTE — Op Note (Signed)
NAME:  LUDWIKA, RODD                          ACCOUNT NO.:  [id number]   MEDICAL RECORD NO.:  [id number]                   PATIENT TYPE:  AMB   LOCATION:  DSC                                  FACILITY:  [facility]   PHYSICIAN:  Adolph Pollack, M.D.            DATE OF BIRTH:  [birth year]   DATE OF PROCEDURE:  04/18/2003  DATE OF DISCHARGE:                                 OPERATIVE REPORT   PREOPERATIVE DIAGNOSIS:  Right breast cancer.   POSTOPERATIVE DIAGNOSIS:  Right breast cancer.   PROCEDURES:  1. Injection of Lymphazurin blue in the right breast.  2. Right axillary sentinel lymph node biopsy.  3. Right partial mastectomy after needle localization.   SURGEON:  Adolph Pollack, M.D.   ANESTHESIA:  General.   INDICATIONS:  Ms. Reardon is a 70 year old female who went for her annual  mammogram and it was noted to have an abnormality at the 12 o'clock position  on the right breast that was nonpalpable.  An ultrasound-guided breast  biopsy demonstrated invasive carcinoma.  MRI does not show any other  suspicious lesions in either breast other than the one that is already  known, and she presents now for the breast conservation procedure.   TECHNIQUE:  She received both the needle localization and the nuclear  medicine injection.  She was then brought to the operating room, placed  supine on the operating table, and a general anesthetic was administered.  She actually had two needles placed.  The superior needle was the one closer  to the lesion.  I cut both wires.  I then sterilely cleansed the periareolar  area with alcohol and injected 1.5 mL of Lymphazurin dye in the subdermal  area in four quadrants for a total of 6 mL.  I then massaged the breast for  five minutes.   Following this the right breast and right axillary area were sterilely  prepped and draped.  Nuclear medicine probe was used to identify the high  count area in the right axilla and a curvilinear incision was  made at this  point through the skin and subcutaneous tissue.  Using guidance from the  Neoprobe, I used blunt dissection and cautery to excise a sentinel lymph  node with counts as high as 900.  I placed the probe back into the axilla  and I still received a count of 200 and found one more lymph node with a  count of approximately 250.  Both of these were noted to have blue  lymphatics draining into them.  These were sent to pathology for touch prep  and were negative on touch prep for carcinoma.   I injected some 0.5% plain Marcaine into the wound.  Once the wound was  hemostatic, I closed the subcutaneous tissue with a running 3-0 Vicryl and  the skin was closed with a 4-0 Monocryl subcuticular stitch.   Next I directed my attention  to the right breast 12 o'clock region, where an  X had been marked where the lesion was beneath it.  The wires were lateral  to this.  I made an elliptical incision directly over this area, leaving a  small bit of skin with the subcutaneous tissue.  I then raised flaps in all  directions, identified both wires, and left them with the specimen.  I then  used electrocautery to perform a right partial mastectomy and marked the  anterior area of the specimen and skin with Monocryl suture and the lateral  portion was marked by the wire.  This was sent for a specimen mammogram and  the area of concern was in the specimen.   I then inspected the wound and used electrocautery for hemostasis.  I  injected 0.5% plain Marcaine into the wound.  Following this I then closed  the subcutaneous tissue with interrupted 3-0 Vicryl sutures.  The skin was  closed with a 4-0 Monocryl subcuticular stitch.  Steri-Strips and sterile  dressings were then placed on both wounds.   She tolerated the procedure well without any apparent complications and was  taken to the recovery room in satisfactory condition.  She will be given  discharge instructions and prescription for Vicodin  for pain.  She will come  back and see me back in the office in one to two weeks.                                               Adolph Pollack, M.D.    Kari Baars  D:  04/18/2003  T:  04/19/2003  Job:  [id number]   cc:   Daryl Eastern, M.D.  [address], [address]  [place]  [place] [address]  Fax: [phone]   Janae Bridgeman. Eloise Harman., M.D.  [address]  [place]  [place] [address]  Fax: [phone]

## 2010-06-22 NOTE — Consult Note (Signed)
[name], [name]                ACCOUNT NO.:  [id number]   MEDICAL RECORD NO.:  [id number]          PATIENT TYPE:  OUT   LOCATION:  GYN                          FACILITY:  [facility]   PHYSICIAN:  John T. Kyla Balzarine, M.D.    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   HISTORY OF PRESENT ILLNESS:  The patient had an episode of vaginal spotting  while on Arimidex for treatment of breast cancer.  She had a colonoscopy  negative for any significant pathology and CT of the abdomen and pelvis  revealed thickening/induration of the posterior wall of the rectovaginal  septum.  She underwent an attempted removal by Dr. Edward Bowers on July 26, 2005.  Apparently, proctectomy occurred during this dissection of a portion of this  mass.  She also underwent hysteroscopy and D&C.  Endometrial curettings were  negative.  The rectal mass had squamous mucosa with attached connective  tissue consistent with fistula tract but no malignancy identified.  The  patient currently notes occasional increase in a creamy to brown vaginal  discharge for which she uses a light-days patch and does not consider this  to represent stool or blood.  She attempts to localize this with a tampon  and it appears to emanate from the lower vagina.  She denies vaginal flatus,  fever, chills.  Hemorrhoids had not flared since I last examined her.   PAST MEDICAL HISTORY:  Significant for breast cancer treated with Arimidex.  No major surgeries other than wide radical excision of breast.   MEDICATIONS:  Arimidex.   ALLERGIES:  KEFLEX.   REVIEW OF SYSTEMS:  Otherwise negative.   EXAM:  VITAL SIGNS:  Stable and afebrile as noted in the flow sheet.  There  is no pathologic lymphadenopathy.  The abdomen is soft and benign with well-  healed incision.  No hernia.  PELVIC:  External genitalia and BUS  normal to inspection, palpation.  Speculum examination reveals a stenotic vaginal introitus and atrophic  vagina.  In the distal posterior vagina, there is a mass effect and some  erythema.  There is a small fistulous opening which loses the small apparent  fistulous tract from which purulent material can be expressed.  This is  nontender.  Cervix is normal.  On bimanual and rectovaginal examinations,  there remains a 3 x 4 cm area of induration in lower posterior vagina.  This  mass is mobile, does not extend into the rectum and is nontender.  With  direct pressure, I am able to expel small amount of purulent material.   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  Alternatively, this could  represent a diverticular/colon fistula which has become partially  obliterated on the colonic side.   PLAN:  As stated before, it is impossible to evaluate the patient based on  examination alone.  She will be treated with Flagyl and we will set up  barium enema as planned.      Jonny Ruiz  Nicki Guadalajara, M.D.  Electronically Signed     JTS/MEDQ  D:  10/08/2005  T:  10/09/2005  Job:  [id number]   cc:   Randye Lobo, M.D.  Fax: [phone]   Pierce Crane, M.D.  Fax: [phone]   Carrington Clamp, M.D.  Fax: [phone]   Janae Bridgeman. Eloise Harman., M.D.  Fax: [phone]   Adolph Pollack, M.D.  [address], [address]  [place]  [place] [address]   Telford Nab, R.N.  [phone] [address]  [place], [place] [address]

## 2010-06-22 NOTE — Consult Note (Signed)
NAMESAYANA, SALLEY                ACCOUNT NO.:  [id number]   MEDICAL RECORD NO.:  [id number]          PATIENT TYPE:  OUT   LOCATION:  GYN                          FACILITY:  [facility]   PHYSICIAN:  John T. Kyla Balzarine, M.D.    DATE OF BIRTH:  01/04/1941   DATE OF CONSULTATION:  08/21/2005  DATE OF DISCHARGE:                                   CONSULTATION   CHIEF COMPLAINT:  Rectovaginal mass.   HISTORY OF PRESENT ILLNESS:  The patient had an episode of vaginal spotting  while on Arimidex for therapy for breast cancer.  She has undergone a  colonoscopy negative for any significant pathology and CT of the abdomen and  pelvis revealing induration/thickening the posterior wall of the  rectovaginal septum.  She underwent an attempted removal by Dr. Edward Jolly on  July 26, 2005.  Apparently, proctotomy occurred during dissection of a  portion of this mass.  Under that anesthesia, she also underwent  hysteroscopy and D&C.  Endometrial curettings reveals blood and scant  squamous mucosa but no endometrial component identified.  The rectal mass  had squamous mucosa with attached connective tissue with inflammation and  fibrosis.  Pathology note did state consistent with fistula tract.  No  malignancy identified.  The patient is seen for consideration of additional  management or observation.  She is currently asymptomatic, both in regards  to bleeding and pain.  She denies tenesmus, passage of feces per vagina, and  passage of flatus per vagina.  She does have external hemorrhoids and these  occasionally flare.   PAST MEDICAL HISTORY:  Significant for breast cancer being treated with  Arimidex times almost three years.  She has undergone no major surgeries  other than wide radical excision of breast.   MEDICATIONS:  Arimidex.   ALLERGIES:  KEFLEX.   REVIEW OF SYSTEMS:  Otherwise negative in 10 systems.   PHYSICAL EXAMINATION:  VITAL SIGNS:  Stable and afebrile as recorded on the flow sheet.  There  is  no pathologic lymphadenopathy.  LUNGS:  Calarco are clear.  ABDOMEN:  Soft and benign with no tenderness, ascites, mass, or  organomegaly.  EXTREMITIES:  Full strength and range of motion without edema.  PELVIC:  External genitalia and BUS normal to inspection and palpation.  Bladder and urethra are normal.  Speculum examination reveals a healing  posterior suture line with some associated induration in the mid posterior  vagina.  Bimanual and rectovaginal examinations reveal induration extending  from the lower third of the vagina to the upper third vagina.  It does  appear that there is a discrete 2 x 3 cm mass that does not extend grossly  into either the vagina or rectum.  The cervix is small and mobile.  No  abnormalities on bimanual examination.   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   A discussion with the patient regarding differential diagnosis.  I doubt  that this is an active fistulous tract, she lacks typical symptoms and  denies a significant use of ethanol.  I would recommend that the patient  complete  healing and be evaluated in the future with barium enema in  approximately two months and a repeat examination.  As long as this  asymptomatic and not growing, I do not believe that surgical intervention is  required.      John T. Kyla Balzarine, M.D.  Electronically Signed     JTS/MEDQ  D:  08/21/2005  T:  08/21/2005  Job:  [id number]   cc:   Randye Lobo, M.D.  Fax: [phone]   Carrington Clamp, M.D.  Fax: [phone]   Pierce Crane, M.D.  Fax: [phone]   Janae Bridgeman. Eloise Harman., M.D.  Fax: [phone]   Adolph Pollack, M.D.  [address], [address]  [place]  [place] [address]   Telford Nab, R.N.  [phone] [address]  [place], [place] [address]

## 2010-06-22 NOTE — Op Note (Signed)
NAMEJAMYIA, FORTUNE                ACCOUNT NO.:  [id number]   MEDICAL RECORD NO.:  [id number]          PATIENT TYPE:  AMB   LOCATION:  SDC                           FACILITY:  [facility]   PHYSICIAN:  Randye Lobo, M.D.   DATE OF BIRTH:  October 10, 1940   DATE OF PROCEDURE:  DATE OF DISCHARGE:                                 OPERATIVE REPORT   PREOPERATIVE DIAGNOSES:  1.  Postmenopausal bleeding.  2.  Rectovaginal mass.  3.  History of breast cancer.   POSTOPERATIVE DIAGNOSES:  1.  Postmenopausal bleeding.  2.  Rectovaginal mass with fistulous tract.  3.  History of breast cancer.   PROCEDURES:  1.  Hysteroscopy.  2.  Dilation and curettage.  3.  Biopsy of rectovaginal mass.  4.  Repair of proctotomy.  5.  Proctoscopy.   SURGEON:  Randye Lobo, M.D.   ASSISTANT:  Carrington Clamp, M.D.   ANESTHESIA:  General endotracheal.   INTRAVENOUS FLUIDS:  1300 cubic centimeters Ringer's lactate.   ESTIMATED BLOOD LOSS:  Minimal.   URINE OUTPUT:  100 cubic centimeters by I&O catheter.   COMPLICATIONS:  Proctotomy.   INDICATIONS FOR PROCEDURE:  The patient is a 70 year old para 1 Caucasian  female with a history of breast cancer diagnosed 2 years prior, who was  treated with lumpectomy, radiation therapy, and Arimidex, who was referred  by Dr. Dimas Alexandria for evaluation of a thickening between the vagina  and the rectum that had become progressive in nature.  The patient had  undergone imaging studies including a pelvic ultrasound in April of 2007,  which showed an unremarkable pelvis, and a CT scan in April of 2007, which  documented a subtle soft tissue fullness of the rectovaginal septum.  For  her gynecologic evaluation, the patient had an endometrial biopsy on  06/07/2005, which documented benign endocervical tissue and with the absence  of the endometrium.  On pelvic exam and rectovaginal exam, a rectovaginal  septal mass, which measured approximately 2 x 1 cm, was  appreciated.  The  patient was sent for a Gastroenterology consultation and for a colonoscopy  prior to proceeding with her surgery.  Dr. Yancey Flemings performed a  colonoscopy and diagnosed the patient with internal hemorrhoids and no  evidence of a rectal mass.   The plan is now to proceed with a hysteroscopy with dilation and curettage  and with excision of the rectovaginal mass after risks, benefits, and  alternatives were discussed with the patient.   FINDINGS:  Examination under anesthesia revealed a small anteverted uterus  with no adnexal masses appreciated.  There was a definite thickened mass  between the rectum and the vagina along the rectovaginal septum.  This mass  extended approximately 3 cm in length and 1.5 cm in width.  The area felt  firm.  Rectal examination documented the absence of a transmural lesion.   Hysteroscopy demonstrated a normal endometrial cavity with no evidence of  polyps nor fibroids.  The endometrial tissue was very scant.   SPECIMENS:  Endometrial curettings were sent to Pathology.   A frozen  section of the rectovaginal mass was sent, and a diagnosis of a  rectovaginal fistulous tract and inflammation was noted.   PROCEDURE:  The patient was reidentified in the Preoperative Hold Area.  She  received clindamycin 900 mg IV for antibiotic prophylaxis.  The patient, in  the Operating Room, received general endotracheal anesthesia; and she was  then placed in the dorsal lithotomy position.  The vagina and perineum were  sterilely prepped and draped, and the bladder was catheterized of urine.  An  exam under anesthesia was performed.   The hysteroscopy with dilation and curettage was performed first.  A  speculum was placed inside the vagina, and a single-tooth tenaculum was  placed on the anterior cervical lip.  The uterus was then sounded to 6 cm.  The cervix was then sequentially dilated up to a #19 Pratt dilator.  Hysteroscopy was then performed  under continuous infusion of sorbitol  solution, and the findings are as noted above.  The hysteroscope was  removed.  There was a 55 cc deficit of Sorbitol.  A combination of sharp and  serrated curettes was used to perform the endometrial curettage.  The  specimen was sent to Pathology.  The vaginal instruments were then removed  at this time.   The biopsy of the rectovaginal mass was performed next.  The posterior  vaginal wall was marked with Allis clamps up to the mid vagina.  A  triangular wedge of epithelium was excised from the perineal body, and the  vaginal mucosa was then incised vertically with a scalpel.  The vaginal  introitus previously had been quite tight, and this approach allowed good  access to the rectovaginal mass.  The vaginal mucosa was dissected off the  mass and rectovaginal septum, which appeared to be quite firm in nature and  with poorly defined borders.  With a gloved finger in the rectum, a biopsy  was performed.  The rectovaginal mass was grasped with an Allis clamp, and a  shallow biopsy was performed and sent to Pathology for frozen section.  Even  with care taken to avoid proctotomy, a small proctotomy was created, which  appeared to be approximately 0.75 centimeters in length.  A repair was  performed in layers, using a running suture of initially 3-0 Vicryl and then  transitioned to 2 additional layers of 2-0 Vicryl.  The first was a running  layer, and the second was a vertical mattress suture.   Proctoscopy was performed at this time.  There were no obvious lesions in  the rectum.  There were some internal hemorrhoids noted.  There was  otherwise no evidence of any kind of mass or lesions.  The rectum appeared  to be intact.   The frozen section returned at this time.  The rectovaginal septum was  evaluated further, and a decision was made to discontinue the procedure at this time as it would be very difficult to remove any additional tissue   without any further proctotomy.  The thickening between the vagina and  rectum, however, remained at the termination of the procedure.   The vagina was closed at this time.  It was closed with a running-locked  suture of 2-0 Vicryl, which was continued along the perineum as per an  episiotomy.  Hemostasis was good at the end of the procedure.  Final rectal  exam confirmed an intact rectum.   The patient was awakened and extubated and sent to the Recovery Room in  stable and  awake condition.  Needle, instrument, and sponge counts were  correct.      Randye Lobo, M.D.  Electronically Signed     BES/MEDQ  D:  07/26/2005  T:  07/26/2005  Job:  [id number]   cc:   Janae Bridgeman. Eloise Harman., M.D.  Fax: [phone]   Pierce Crane, M.D.  Fax: [phone]   Wilhemina Bonito. Marina Goodell, M.D. [facility]  [address]  [place]  [place] [address]

## 2010-06-29 ENCOUNTER — Other Ambulatory Visit: Payer: Self-pay | Admitting: *Deleted

## 2010-06-29 MED ORDER — ATENOLOL 50 MG PO TABS: ORAL_TABLET | ORAL | Status: DC

## 2010-08-23 ENCOUNTER — Other Ambulatory Visit (INDEPENDENT_AMBULATORY_CARE_PROVIDER_SITE_OTHER): Payer: Medicare Other

## 2010-08-23 DIAGNOSIS — M8580 Other specified disorders of bone density and structure, unspecified site: Secondary | ICD-10-CM

## 2010-08-23 DIAGNOSIS — I10 Essential (primary) hypertension: Secondary | ICD-10-CM

## 2010-08-23 DIAGNOSIS — M899 Disorder of bone, unspecified: Secondary | ICD-10-CM

## 2010-08-23 DIAGNOSIS — E7800 Pure hypercholesterolemia, unspecified: Secondary | ICD-10-CM

## 2010-08-23 LAB — CBC WITH DIFFERENTIAL/PLATELET
Basophils Absolute: 0 10*3/uL (ref 0.0–0.1)
Basophils Relative: 0.5 % (ref 0.0–3.0)
Eosinophils Absolute: 0.2 10*3/uL (ref 0.0–0.7)
Lymphocytes Relative: 26 % (ref 12.0–46.0)
MCHC: 33.1 g/dL (ref 30.0–36.0)
MCV: 88.2 fl (ref 78.0–100.0)
Monocytes Absolute: 0.5 10*3/uL (ref 0.1–1.0)
Neutrophils Relative %: 63.4 % (ref 43.0–77.0)
RBC: 4.85 Mil/uL (ref 3.87–5.11)
RDW: 13.2 % (ref 11.5–14.6)

## 2010-08-23 LAB — COMPREHENSIVE METABOLIC PANEL
ALT: 17 U/L (ref 0–35)
AST: 22 U/L (ref 0–37)
Albumin: 4.2 g/dL (ref 3.5–5.2)
CO2: 28 mEq/L (ref 19–32)
Calcium: 9.1 mg/dL (ref 8.4–10.5)
Chloride: 107 mEq/L (ref 96–112)
Creatinine, Ser: 0.8 mg/dL (ref 0.4–1.2)
GFR: 71.75 mL/min (ref >60.00)
Potassium: 4.4 mEq/L (ref 3.5–5.1)
Sodium: 142 mEq/L (ref 135–145)
Total Protein: 7.2 g/dL (ref 6.0–8.3)

## 2010-08-23 LAB — LIPID PANEL
LDL Cholesterol: 116 mg/dL — ABNORMAL HIGH (ref 0–99)
Total CHOL/HDL Ratio: 4

## 2010-08-27 ENCOUNTER — Telehealth: Payer: Self-pay | Admitting: Family Medicine

## 2010-08-27 DIAGNOSIS — I10 Essential (primary) hypertension: Secondary | ICD-10-CM

## 2010-08-27 DIAGNOSIS — E7800 Pure hypercholesterolemia, unspecified: Secondary | ICD-10-CM

## 2010-08-27 NOTE — Telephone Encounter (Signed)
Message copied by Excell Seltzer on Mon Aug 27, 2010  8:01 AM ------      Message from: Alvina Chou      Created: Tue Aug 21, 2010 11:24 AM       Patient is scheduled for Thursday CPX labs, please order future labs, Thanks , Camelia Eng

## 2010-08-28 ENCOUNTER — Encounter: Payer: Self-pay | Admitting: Family Medicine

## 2010-08-30 ENCOUNTER — Ambulatory Visit (INDEPENDENT_AMBULATORY_CARE_PROVIDER_SITE_OTHER): Payer: Medicare Other | Admitting: Family Medicine

## 2010-08-30 ENCOUNTER — Encounter: Payer: Self-pay | Admitting: Family Medicine

## 2010-08-30 DIAGNOSIS — M899 Disorder of bone, unspecified: Secondary | ICD-10-CM

## 2010-08-30 DIAGNOSIS — I10 Essential (primary) hypertension: Secondary | ICD-10-CM

## 2010-08-30 DIAGNOSIS — M949 Disorder of cartilage, unspecified: Secondary | ICD-10-CM

## 2010-08-30 DIAGNOSIS — C50919 Malignant neoplasm of unspecified site of unspecified female breast: Secondary | ICD-10-CM

## 2010-08-30 DIAGNOSIS — E7800 Pure hypercholesterolemia, unspecified: Secondary | ICD-10-CM

## 2010-08-30 DIAGNOSIS — Z0000 Encounter for general adult medical examination without abnormal findings: Secondary | ICD-10-CM

## 2010-08-30 NOTE — Patient Instructions (Addendum)
For allergies consider claritin. Or zyrtec. If not improving we can consider a nasal steroid. Avoid decongestants.  For hearing loss: try allergy treatments, if not improving call if referral to ENT needed. Goal omega 3 ALA, EPA and DHA: 2000 mg divided daily.  When scheduling mammogram make sure to request bone density. Consider living will, HCPOA.

## 2010-08-30 NOTE — Assessment & Plan Note (Deleted)
Resolved off prescription statin.

## 2010-08-30 NOTE — Progress Notes (Signed)
Subjective:    Patient ID: [name], female    DOB: Jun 16, 1940, 70 y.o.   MRN: [id number]  HPI I have personally reviewed the Medicare Annual Wellness questionnaire and have noted 1. The patient's medical and social history 2. Their use of alcohol, tobacco or illicit drugs 3. Their current medications and supplements 4. The patient's functional ability including ADL's, fall risks, home safety risks and hearing or visual             impairment. 5. Diet and physical activities 6. Evidence for depression or mood disorders The patients weight, height, BMI and visual acuity have been recorded in the chart I have made referrals, counseling and provided education to the patient based review of the above and I have provided the pt with a written personalized care plan for preventive services.  Elevated liver transaminases, mild: Was on OTC statin medication, now resolved.  Hx of breast adenocarcinoma:  Has had breast exam at Dr. Bernadette Hoit....has appt next month.  70 years breast cancer free.  Mammogram nml in 02/2010  Hypertension:  Well controlled on atenolol  Using medication without problems or lightheadedness:  Chest pain with exertion: None Edema:None Short of breath:None Average home BPs:121/73-130/79   Elevated Cholesterol:  LDL at goal < 130, on omega three fatty acids   Nml EKG in 2009.     Review of Systems  Constitutional: Negative for fever and fatigue.  HENT: Positive for postnasal drip. Negative for ear pain, sneezing and neck pain.        Wears hearing aids, hx of stapendectomy Has had sudden worsening of hearing in last few months.  Eyes: Negative for pain.  Gastrointestinal: Positive for blood in stool. Negative for abdominal pain, diarrhea, constipation and abdominal distention.       Only occ with hemmorohoids acting up  Genitourinary: Negative for dysuria, hematuria and vaginal bleeding.  Musculoskeletal: Negative for arthralgias.  Skin: Negative for  rash.  Psychiatric/Behavioral: Negative for behavioral problems. The patient is not nervous/anxious.        Objective:   Physical Exam  Constitutional: Vital signs are normal. She appears well-developed and well-nourished. She is cooperative.  Non-toxic appearance. She does not appear ill. No distress.  HENT:  Head: Normocephalic.  Right Ear: Tympanic membrane, external ear and ear canal normal. No drainage. Tympanic membrane is not injected, not scarred, not perforated, not erythematous, not retracted and not bulging. No middle ear effusion. Decreased hearing is noted.  Left Ear: External ear and ear canal normal. No drainage. Tympanic membrane is not injected, not scarred, not perforated, not erythematous, not retracted and not bulging. A middle ear effusion is present. Decreased hearing is noted.  Nose: Nose normal.       Mild scarring B TMs from past surgical procedure.  Eyes: Conjunctivae, EOM and lids are normal. Pupils are equal, round, and reactive to light. No foreign bodies found.  Neck: Trachea normal and normal range of motion. Neck supple. Carotid bruit is not present. No mass and no thyromegaly present.  Cardiovascular: Normal rate, regular rhythm, S1 normal, S2 normal, normal heart sounds and intact distal pulses.  Exam reveals no gallop.   No murmur heard. Pulmonary/Chest: Effort normal and breath sounds normal. No respiratory distress. She has no wheezes. She has no rhonchi. She has no rales.  Abdominal: Soft. Normal appearance and bowel sounds are normal. She exhibits no distension, no fluid wave, no abdominal bruit and no mass. There is no hepatosplenomegaly. There is no  tenderness. There is no rebound, no guarding and no CVA tenderness. No hernia.  Genitourinary:       PAP and DVE not indicated.  Breast exam will be by Dr. Donnie Coffin in 8-10/2010  Lymphadenopathy:    She has no cervical adenopathy.    She has no axillary adenopathy.  Neurological: She is alert. She has normal  strength. No cranial nerve deficit or sensory deficit.  Skin: Skin is warm, dry and intact. No rash noted.  Psychiatric: Her speech is normal and behavior is normal. Judgment normal. Her mood appears not anxious. Cognition and memory are normal. She does not exhibit a depressed mood.          Assessment & Plan:  Annual Medicare Wellness: The patient's preventative maintenance and recommended screening tests for an annual wellness exam were reviewed in full today. Brought up to date unless services declined.  Counselled on the importance of diet, exercise, and its role in overall health and mortality. The patient's FH and SH was reviewed, including their home life, tobacco status, and drug and alcohol status.   No DVE or PAP required given age and no family history of Ov/uterine cancer, no vaginal symptoms. UTD with vaccines, colonoscopy.  Due for DXA after 12/2010

## 2010-08-30 NOTE — Assessment & Plan Note (Signed)
Followed by Dr. Donnie Coffin. Breast exam performed there.

## 2010-08-30 NOTE — Assessment & Plan Note (Signed)
Calcium and vit and weight bearing exercsie. Due for DXA after 12/2010.

## 2010-08-30 NOTE — Assessment & Plan Note (Signed)
Well controlled on red yeast rice, omega 3s. LDL at goal <130.

## 2010-08-30 NOTE — Assessment & Plan Note (Signed)
Well controlled. Continue current medication.  

## 2010-09-06 ENCOUNTER — Other Ambulatory Visit: Payer: Self-pay | Admitting: Oncology

## 2010-09-06 ENCOUNTER — Encounter (HOSPITAL_BASED_OUTPATIENT_CLINIC_OR_DEPARTMENT_OTHER): Payer: Medicare Other | Admitting: Oncology

## 2010-09-06 DIAGNOSIS — C50419 Malignant neoplasm of upper-outer quadrant of unspecified female breast: Secondary | ICD-10-CM

## 2010-09-06 DIAGNOSIS — Z7982 Long term (current) use of aspirin: Secondary | ICD-10-CM

## 2010-09-06 LAB — CBC WITH DIFFERENTIAL/PLATELET
Basophils Absolute: 0 10*3/uL (ref 0.0–0.1)
EOS%: 2.5 % (ref 0.0–7.0)
Eosinophils Absolute: 0.2 10*3/uL (ref 0.0–0.5)
HGB: 13.7 g/dL (ref 11.6–15.9)
LYMPH%: 31 % (ref 14.0–49.7)
MCH: 29.7 pg (ref 25.1–34.0)
MCV: 87.3 fL (ref 79.5–101.0)
MONO%: 8 % (ref 0.0–14.0)
NEUT#: 3.8 10*3/uL (ref 1.5–6.5)
Platelets: 167 10*3/uL (ref 145–400)
RBC: 4.61 10*6/uL (ref 3.70–5.45)

## 2010-09-06 LAB — COMPREHENSIVE METABOLIC PANEL
Alkaline Phosphatase: 47 U/L (ref 39–117)
BUN: 13 mg/dL (ref 6–23)
Glucose, Bld: 97 mg/dL (ref 70–99)
Total Bilirubin: 0.5 mg/dL (ref 0.3–1.2)

## 2010-09-06 LAB — VITAMIN D 25 HYDROXY (VIT D DEFICIENCY, FRACTURES): Vit D, 25-Hydroxy: 40 ng/mL (ref 30–89)

## 2010-09-12 ENCOUNTER — Encounter (HOSPITAL_BASED_OUTPATIENT_CLINIC_OR_DEPARTMENT_OTHER): Payer: Medicare Other | Admitting: Oncology

## 2010-09-12 ENCOUNTER — Other Ambulatory Visit: Payer: Self-pay | Admitting: Oncology

## 2010-09-12 DIAGNOSIS — Z1231 Encounter for screening mammogram for malignant neoplasm of breast: Secondary | ICD-10-CM

## 2010-09-12 DIAGNOSIS — Z7982 Long term (current) use of aspirin: Secondary | ICD-10-CM

## 2010-09-12 DIAGNOSIS — C50419 Malignant neoplasm of upper-outer quadrant of unspecified female breast: Secondary | ICD-10-CM

## 2011-02-01 ENCOUNTER — Ambulatory Visit (INDEPENDENT_AMBULATORY_CARE_PROVIDER_SITE_OTHER): Payer: Medicare Other | Admitting: Family Medicine

## 2011-02-01 ENCOUNTER — Encounter: Payer: Self-pay | Admitting: Family Medicine

## 2011-02-01 DIAGNOSIS — L0291 Cutaneous abscess, unspecified: Secondary | ICD-10-CM | POA: Insufficient documentation

## 2011-02-01 DIAGNOSIS — N898 Other specified noninflammatory disorders of vagina: Secondary | ICD-10-CM

## 2011-02-01 DIAGNOSIS — K644 Residual hemorrhoidal skin tags: Secondary | ICD-10-CM | POA: Insufficient documentation

## 2011-02-01 MED ORDER — DOXYCYCLINE HYCLATE 100 MG PO TABS: 100.0000 mg | ORAL_TABLET | Freq: Two times a day (BID) | ORAL | Status: AC

## 2011-02-01 MED ORDER — HYDROCORTISONE ACE-PRAMOXINE 2.5-1 % RE CREA: TOPICAL_CREAM | Freq: Three times a day (TID) | RECTAL | Status: AC

## 2011-02-01 NOTE — Assessment & Plan Note (Signed)
At site of old vaginal laceration.  Now has had two infections there.  Sent for culture. Start doxy BID x 10 days given PCN allergy. Continue sitz baths/warm compress. Follow up if not improving or consider follow up with GYN to discuss repair of old laceration.

## 2011-02-01 NOTE — Progress Notes (Signed)
  Subjective:    Patient ID: [name], female    DOB: 09-14-1940, 70 y.o.   MRN: [id number]  HPI  70 year old female presents with boil  Between anus and vaginal. Noted first 2 days ago.Marland Kitchen Swollen, tender. Started doing sitz baths. Lesion ruptured today, and is continuing to drain. \ She has had similar boils in the past, last one was few years ago.  She had history of vaginal approach possible rectocele repair.Marland Kitchen Has sutures in vaginal area... Left with small hole, opening in vaginal floor.  This is area that has gotten infected twice now.    No fever, no abdominal pain.  She has recurrent issues with hemmorhoids, external.. Swell and itch, no bleeding.  Review of Systems  Constitutional: Negative for fever and fatigue.  HENT: Negative for ear pain.   Eyes: Negative for pain.  Respiratory: Negative for chest tightness and shortness of breath.   Cardiovascular: Negative for chest pain, palpitations and leg swelling.  Gastrointestinal: Negative for abdominal pain.  Genitourinary: Negative for dysuria.       Objective:   Physical Exam  Constitutional: She appears well-developed and well-nourished.  HENT:  Head: Normocephalic.  Cardiovascular: Normal rate, regular rhythm and intact distal pulses.  Exam reveals no gallop and no friction rub.   No murmur heard. Pulmonary/Chest: Effort normal and breath sounds normal.  Abdominal: Soft. Bowel sounds are normal. She exhibits no mass. There is no tenderness. There is no rebound and no guarding.  Genitourinary: Rectal exam shows external hemorrhoid. There is no rash, tenderness or lesion on the right labia. There is no rash, tenderness or lesion on the left labia. There is erythema, tenderness and bleeding around the vagina. Vaginal discharge found.       Blister right inner leg from heating pad  1 cm diameter opening in floor of vaginal wall... Bloody pus discharge Swelling , nodule between rectum and vaginal opening             Assessment & Plan:

## 2011-02-01 NOTE — Patient Instructions (Signed)
Continue warm compresses or SITZ baths 3 times daily. Complete antibiotics. Consider returning to  GYN for reassessment of vaginal lesion and possible repair.

## 2011-02-01 NOTE — Assessment & Plan Note (Signed)
Site of infection 2 times now.. Consider referral to GYN repair.

## 2011-02-01 NOTE — Assessment & Plan Note (Signed)
Sent in rx for treatment.

## 2011-02-04 LAB — WOUND CULTURE: Gram Stain: NONE SEEN

## 2011-04-01 ENCOUNTER — Ambulatory Visit
Admission: RE | Admit: 2011-04-01 | Discharge: 2011-04-01 | Disposition: A | Discharge status: Home / Self Care | Payer: Medicare Other | Source: Ambulatory Visit | Attending: Oncology | Admitting: Oncology

## 2011-04-01 DIAGNOSIS — Z1231 Encounter for screening mammogram for malignant neoplasm of breast: Secondary | ICD-10-CM

## 2011-07-22 ENCOUNTER — Other Ambulatory Visit: Payer: Self-pay | Admitting: Family Medicine

## 2011-07-22 NOTE — Telephone Encounter (Signed)
[facility] request refill atenolol # 45 x 1 with  Note needs to call for appt.

## 2011-09-02 ENCOUNTER — Other Ambulatory Visit (HOSPITAL_BASED_OUTPATIENT_CLINIC_OR_DEPARTMENT_OTHER): Payer: Medicare Other | Admitting: Lab

## 2011-09-02 DIAGNOSIS — C50419 Malignant neoplasm of upper-outer quadrant of unspecified female breast: Secondary | ICD-10-CM

## 2011-09-02 LAB — CBC WITH DIFFERENTIAL/PLATELET
Basophils Absolute: 0 10*3/uL (ref 0.0–0.1)
Eosinophils Absolute: 0.2 10*3/uL (ref 0.0–0.5)
HCT: 42.3 % (ref 34.8–46.6)
HGB: 14.3 g/dL (ref 11.6–15.9)
MONO#: 0.6 10*3/uL (ref 0.1–0.9)
NEUT#: 4 10*3/uL (ref 1.5–6.5)
NEUT%: 60.5 % (ref 38.4–76.8)
RDW: 13.7 % (ref 11.2–14.5)
WBC: 6.7 10*3/uL (ref 3.9–10.3)
lymph#: 1.8 10*3/uL (ref 0.9–3.3)

## 2011-09-03 LAB — COMPREHENSIVE METABOLIC PANEL
Albumin: 3.8 g/dL (ref 3.5–5.2)
BUN: 15 mg/dL (ref 6–23)
CO2: 24 mEq/L (ref 19–32)
Calcium: 9.3 mg/dL (ref 8.4–10.5)
Chloride: 106 mEq/L (ref 96–112)
Creatinine, Ser: 0.77 mg/dL (ref 0.50–1.10)
Potassium: 4.1 mEq/L (ref 3.5–5.3)

## 2011-09-03 LAB — VITAMIN D 25 HYDROXY (VIT D DEFICIENCY, FRACTURES): Vit D, 25-Hydroxy: 32 ng/mL (ref 30–89)

## 2011-09-05 ENCOUNTER — Other Ambulatory Visit: Payer: Medicare Other | Admitting: Lab

## 2011-09-12 ENCOUNTER — Ambulatory Visit (HOSPITAL_BASED_OUTPATIENT_CLINIC_OR_DEPARTMENT_OTHER): Payer: Medicare Other | Admitting: Oncology

## 2011-09-12 ENCOUNTER — Telehealth: Payer: Self-pay | Admitting: Oncology

## 2011-09-12 VITALS — BP 143/81 | HR 74 | Temp 97.8°F | Resp 20 | Ht 61.0 in | Wt 126.4 lb

## 2011-09-12 DIAGNOSIS — E559 Vitamin D deficiency, unspecified: Secondary | ICD-10-CM

## 2011-09-12 DIAGNOSIS — Z853 Personal history of malignant neoplasm of breast: Secondary | ICD-10-CM

## 2011-09-12 DIAGNOSIS — C50919 Malignant neoplasm of unspecified site of unspecified female breast: Secondary | ICD-10-CM

## 2011-09-12 NOTE — Progress Notes (Signed)
Hematology and Oncology Follow Up Visit  Angela BLASCHKE [id number] [birth year] 71 y.o. 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  . ADENOCARCINOMA, BREAST Yes  . Unspecified vitamin D deficiency      PAST THERAPY: As above   Interim History:  Patient is doing well. She returns for followup. She's had an uneventful year. Her medications are the same. She is busy looking after her 87 year old father in law. She has had a mammogram this past year as well as a bone density study done. I reviewed those results with her. Her bone density appears adequate and show some gradual improvement over the past 6 years. She does take calcium and vitamin D supplementation. As noted she has no other complaint  Medications: I have reviewed the patient's current medications.  Allergies:  Allergies  Allergen Reactions  . Penicillins     REACTION: Rash    Past Medical History, Surgical history, Social history, and Family History were reviewed and updated.  Review of Systems: Constitutional:  Negative for fever, chills, night sweats, anorexia, weight loss, pain. Cardiovascular: no chest pain or dyspnea on exertion Respiratory: negative Neurological: negative Dermatological: negative ENT: negative Skin Gastrointestinal: negative Genito-Urinary: negative Hematological and Lymphatic: negative Breast: negative Musculoskeletal: negative Remaining ROS negative.  Physical Exam:  Blood pressure 143/81, pulse 74, temperature 97.8 F (36.6 C), temperature source Oral, resp. rate 20, height 5\' 1"  (1.549 m), weight 126 lb 6.4 oz (57.335 kg).  ECOG: 0  HEENT:  Sclerae anicteric, conjunctivae pink.  Oropharynx clear.  No mucositis or candidiasis.  Nodes:  No cervical, supraclavicular, or axillary lymphadenopathy palpated.  Breast Exam:   Right breast is status post lumpectomy, scar in the right upper quadrant is healed well is no evidence of local recurrence.  Left breast is benign.  No masses, discharge, skin change, or nipple inversion..  Lungs:  Clear to auscultation bilaterally.  No crackles, rhonchi, or wheezes.  Heart:  Regular rate and rhythm.  Abdomen:  Soft, nontender.  Positive bowel sounds.  No organomegaly or masses palpated.  Musculoskeletal:  No focal spinal tenderness to palpation.  Extremities:  Benign.  No peripheral edema or cyanosis.  Skin:  Benign.  Neuro:  Nonfocal.    Lab Results: Lab Results  Component Value Date   WBC 6.7 09/02/2011   HGB 14.3 09/02/2011   HCT 42.3 09/02/2011   MCV 86.7 09/02/2011   PLT 175 09/02/2011     Chemistry      Component Value Date/Time   NA 139 09/02/2011 1000   K 4.1 09/02/2011 1000   CL 106 09/02/2011 1000   CO2 24 09/02/2011 1000   BUN 15 09/02/2011 1000   CREATININE 0.77 09/02/2011 1000      Component Value Date/Time   CALCIUM 9.3 09/02/2011 1000   ALKPHOS 45 09/02/2011 1000   AST 23 09/02/2011 1000   ALT 22 09/02/2011 1000   BILITOT 0.5 09/02/2011 1000       Radiological Studies:  No results found.   IMPRESSIONS AND PLAN: A 71 y.o. 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 I will see her again in a years time we discussed discharge from clinic at that point I've also reordered her mammogram.  Spent more than half the time coordinating care, as well as discussion of BMI and its implications.  [name] 8/8/20139:43 AM Cell [id number]

## 2011-09-12 NOTE — Telephone Encounter (Signed)
Mailed the pt her feb mammo appt and her aug 2014 appt calendar per pt's request

## 2011-10-08 ENCOUNTER — Other Ambulatory Visit: Payer: Medicare Other

## 2011-10-09 ENCOUNTER — Other Ambulatory Visit: Payer: Medicare Other

## 2011-10-10 ENCOUNTER — Other Ambulatory Visit (INDEPENDENT_AMBULATORY_CARE_PROVIDER_SITE_OTHER): Payer: Medicare Other

## 2011-10-10 ENCOUNTER — Telehealth: Payer: Self-pay | Admitting: Family Medicine

## 2011-10-10 DIAGNOSIS — C50919 Malignant neoplasm of unspecified site of unspecified female breast: Secondary | ICD-10-CM

## 2011-10-10 DIAGNOSIS — M899 Disorder of bone, unspecified: Secondary | ICD-10-CM

## 2011-10-10 DIAGNOSIS — E7800 Pure hypercholesterolemia, unspecified: Secondary | ICD-10-CM

## 2011-10-10 DIAGNOSIS — E559 Vitamin D deficiency, unspecified: Secondary | ICD-10-CM

## 2011-10-10 DIAGNOSIS — M949 Disorder of cartilage, unspecified: Secondary | ICD-10-CM

## 2011-10-10 LAB — COMPREHENSIVE METABOLIC PANEL
ALT: 15 U/L (ref 0–35)
AST: 21 U/L (ref 0–37)
Albumin: 3.8 g/dL (ref 3.5–5.2)
Alkaline Phosphatase: 38 U/L — ABNORMAL LOW (ref 39–117)
BUN: 16 mg/dL (ref 6–23)
Calcium: 9.1 mg/dL (ref 8.4–10.5)
Chloride: 107 mEq/L (ref 96–112)
Creatinine, Ser: 0.8 mg/dL (ref 0.4–1.2)
Potassium: 4 mEq/L (ref 3.5–5.1)

## 2011-10-10 LAB — LDL CHOLESTEROL, DIRECT: Direct LDL: 143.8 mg/dL

## 2011-10-10 LAB — LIPID PANEL
HDL: 49.4 mg/dL (ref >39.00)
Total CHOL/HDL Ratio: 4
Triglycerides: 123 mg/dL (ref 0.0–149.0)

## 2011-10-10 NOTE — Telephone Encounter (Signed)
Message copied by Excell Seltzer on Thu Oct 10, 2011  1:44 AM ------      Message from: Alvina Chou      Created: Wed Oct 09, 2011  3:32 PM      Regarding: lab orders for Thursday, 9-5       Patient is scheduled for CPX labs, please order future labs, Thanks , Camelia Eng

## 2011-10-10 NOTE — Addendum Note (Signed)
Addended by: Alvina Chou on: 10/10/2011 09:31 AM   Modules accepted: Orders

## 2011-10-11 ENCOUNTER — Ambulatory Visit (INDEPENDENT_AMBULATORY_CARE_PROVIDER_SITE_OTHER): Payer: Medicare Other | Admitting: Family Medicine

## 2011-10-11 ENCOUNTER — Encounter: Payer: Self-pay | Admitting: Family Medicine

## 2011-10-11 VITALS — BP 174/98 | HR 70 | Temp 97.9°F | Resp 14 | Ht 61.0 in | Wt 124.5 lb

## 2011-10-11 DIAGNOSIS — T679XXA Effect of heat and light, unspecified, initial encounter: Secondary | ICD-10-CM

## 2011-10-11 DIAGNOSIS — Z0000 Encounter for general adult medical examination without abnormal findings: Secondary | ICD-10-CM

## 2011-10-11 DIAGNOSIS — E7800 Pure hypercholesterolemia, unspecified: Secondary | ICD-10-CM

## 2011-10-11 DIAGNOSIS — R6889 Other general symptoms and signs: Secondary | ICD-10-CM

## 2011-10-11 DIAGNOSIS — I10 Essential (primary) hypertension: Secondary | ICD-10-CM

## 2011-10-11 LAB — TSH: TSH: 0.93 u[IU]/mL (ref 0.35–5.50)

## 2011-10-11 NOTE — Assessment & Plan Note (Signed)
Well controlled. Continue current medication.  

## 2011-10-11 NOTE — Patient Instructions (Addendum)
Get back to using 75 mg daily of atenolol. Follow BP at home.. Call me if BP >140/90 x 3 Get back on track with exercise to improve cholesterol. Talk with Dr. Donnie Coffin about Evista for osteoporosis. We will call with lab results.  Follow up in 3 months for BP check.

## 2011-10-11 NOTE — Progress Notes (Signed)
HPI  I have personally reviewed the Medicare Annual Wellness questionnaire and have noted  1. The patient's medical and social history  2. Their use of alcohol, tobacco or illicit drugs  3. Their current medications and supplements  4. The patient's functional ability including ADL's, fall risks, home safety risks and hearing or visual  impairment.  5. Diet and physical activities  6. Evidence for depression or mood disorders  The patients weight, height, BMI and visual acuity have been recorded in the chart  I have made referrals, counseling and provided education to the patient based review of the above and I have provided the pt with a written personalized care plan for preventive services.   Elevated liver transaminases, mild: Was on OTC statin medication, now resolved.   Hx of breast adenocarcinoma:  Has had breast exam at Dr. Bernadette Hoit....just saw Dr. Donnie Coffin 8/8 8 years breast cancer free.  Mammogram nml in 02/2010  Started on D3.  Hypertension: Poor control on atenolol today, but she has been under a lot of stress lately. Has not been taking full 75 mg daily.. because she is not sure it gave her SE.  Using medication without problems or lightheadedness:  Chest pain with exertion: None  Edema:None  Short of breath:None  Average home BPs:135/80...   Not walking like she used to.  Diet:healthy. Low fat diet, fruits and veggies.  Elevated Cholesterol: LDL not at goal < 130, on omega three fatty acids  Lab Results  Component Value Date   CHOL 209* 10/10/2011   HDL 49.40 10/10/2011   LDLCALC 116* 08/23/2010   LDLDIRECT 143.8 10/10/2011   TRIG 123.0 10/10/2011   CHOLHDL 4 10/10/2011    Nml EKG in 2009.   Has noted hot flashes, ongoing frequent at night for 1 year. No night sweats. No unexpected weight loss. No new meds. No fatigue. Not jittery  Review of Systems  Constitutional: Negative for fever and fatigue.  HENT: Positive for postnasal drip. Negative for ear pain, sneezing  and neck pain.  Wears hearing aids, hx of stapendectomy   No chest [pain, NO SOB Eyes: Negative for pain.  Gastrointestinal: Positive for blood in stool. Negative for abdominal pain, diarrhea, constipation and abdominal distention.  Only occ with hemmorohoids acting up  Genitourinary: Negative for dysuria, hematuria and vaginal bleeding.  Musculoskeletal: Negative for arthralgias.  Skin: Negative for rash.  Psychiatric/Behavioral: Negative for behavioral problems. The patient is not nervous/anxious.    Objective:   Physical Exam  Constitutional: Vital signs are normal. She appears well-developed and well-nourished. She is cooperative. Non-toxic appearance. She does not appear ill. No distress.  HENT:  Head: Normocephalic.  Right Ear: Tympanic membrane, external ear and ear canal normal. No drainage. Tympanic membrane is not injected, not scarred, not perforated, not erythematous, not retracted and not bulging. No middle ear effusion.  Left Ear: External ear and ear canal normal. No drainage. Tympanic membrane is not injected, not scarred, not perforated, not erythematous, not retracted and not bulging. Nose: Nose normal.  Mild scarring B TMs from past surgical procedure.  Eyes: Conjunctivae, EOM and lids are normal. Pupils are equal, round, and reactive to light. No foreign bodies found.  Neck: Trachea normal and normal range of motion. Neck supple. Carotid bruit is not present. No mass and no thyromegaly present.  Cardiovascular: Normal rate, regular rhythm, S1 normal, S2 normal, normal heart sounds and intact distal pulses. Exam reveals no gallop.  No murmur heard.  Pulmonary/Chest: Effort normal and  breath sounds normal. No respiratory distress. She has no wheezes. She has no rhonchi. She has no rales.  Abdominal: Soft. Normal appearance and bowel sounds are normal. She exhibits no distension, no fluid wave, no abdominal bruit and no mass. There is no hepatosplenomegaly. There is no  tenderness. There is no rebound, no guarding and no CVA tenderness. No hernia.  Genitourinary:  PAP and DVE not indicated. Breast exam was performed by Dr. Donnie Coffin in 09/2011  Lymphadenopathy:  She has no cervical adenopathy.  She has no axillary adenopathy.  Neurological: She is alert. She has normal strength. No cranial nerve deficit or sensory deficit.  Skin: Skin is warm, dry and intact. No rash noted.  Psychiatric: Her speech is normal and behavior is normal. Judgment normal. Her mood appears not anxious. Cognition and memory are normal. She does not exhibit a depressed mood.    Assessment & Plan:   Annual Medicare Wellness:  The patient's preventative maintenance and recommended screening tests for an annual wellness exam were reviewed in full today.  Brought up to date unless services declined.  Counselled on the importance of diet, exercise, and its role in overall health and mortality.  The patient's FH and SH was reviewed, including their home life, tobacco status, and drug and alcohol status.   No DVE or PAP required given age and no family history of Ov/uterine cancer, no vaginal symptoms.  UTD with vaccines Colonoscopy: 2007, next due 2017 DEXA: 03/2011 osteoporosis, not interested in fosamax.

## 2011-10-15 ENCOUNTER — Other Ambulatory Visit: Payer: Self-pay | Admitting: *Deleted

## 2011-10-15 MED ORDER — ATENOLOL 50 MG PO TABS: ORAL_TABLET | ORAL | Status: DC

## 2011-11-12 ENCOUNTER — Other Ambulatory Visit: Payer: Medicare Other

## 2011-11-14 ENCOUNTER — Encounter: Payer: Medicare Other | Admitting: Family Medicine

## 2012-01-14 ENCOUNTER — Encounter: Payer: Self-pay | Admitting: Family Medicine

## 2012-01-14 ENCOUNTER — Ambulatory Visit (INDEPENDENT_AMBULATORY_CARE_PROVIDER_SITE_OTHER): Payer: Medicare Other | Admitting: Family Medicine

## 2012-01-14 VITALS — BP 130/82 | HR 73 | Temp 97.7°F | Ht 61.0 in | Wt 125.5 lb

## 2012-01-14 DIAGNOSIS — I10 Essential (primary) hypertension: Secondary | ICD-10-CM

## 2012-01-14 DIAGNOSIS — Z23 Encounter for immunization: Secondary | ICD-10-CM

## 2012-01-14 DIAGNOSIS — J309 Allergic rhinitis, unspecified: Secondary | ICD-10-CM | POA: Insufficient documentation

## 2012-01-14 MED ORDER — FLUTICASONE PROPIONATE 50 MCG/ACT NA SUSP: 2.0000 | Freq: Every day | NASAL | Status: DC

## 2012-01-14 NOTE — Progress Notes (Signed)
  Subjective:    Patient ID: KYRIE BUN, female    DOB: 12-03-1940, 71 y.o.   MRN: [id number]  HPI Hypertension:  Went back on atenolol 75 mg daily for a while but felt it went to low, but now doing better on 50 mg daily.  BP improved control Using medication without problems or lightheadedness: None Chest pain with exertion:None Edema:None Short of breath:none Average home BPs: 121/71129/77 Other issues:    Review of Systems  Constitutional: Negative for fever and fatigue.  HENT: Positive for congestion and postnasal drip. Negative for ear pain.        Hoarse voice  Eyes: Negative for pain.  Respiratory: Positive for cough. Negative for chest tightness and shortness of breath.   Cardiovascular: Negative for chest pain, palpitations and leg swelling.  Gastrointestinal: Negative for abdominal pain.  Genitourinary: Negative for dysuria.       Objective:   Physical Exam  Constitutional: Vital signs are normal. She appears well-developed and well-nourished. She is cooperative.  Non-toxic appearance. She does not appear ill. No distress.  HENT:  Head: Normocephalic.  Right Ear: Hearing, tympanic membrane, external ear and ear canal normal. Tympanic membrane is not erythematous, not retracted and not bulging.  Left Ear: Hearing, tympanic membrane, external ear and ear canal normal. Tympanic membrane is not erythematous, not retracted and not bulging.  Nose: Mucosal edema present. No rhinorrhea. Right sinus exhibits no maxillary sinus tenderness and no frontal sinus tenderness. Left sinus exhibits no maxillary sinus tenderness and no frontal sinus tenderness.  Mouth/Throat: Uvula is midline, oropharynx is clear and moist and mucous membranes are normal.  Eyes: Conjunctivae normal, EOM and lids are normal. Pupils are equal, round, and reactive to light. No foreign bodies found.  Neck: Trachea normal and normal range of motion. Neck supple. Carotid bruit is not present. No mass and no  thyromegaly present.  Cardiovascular: Normal rate, regular rhythm, S1 normal, S2 normal, normal heart sounds, intact distal pulses and normal pulses.  Exam reveals no gallop and no friction rub.   No murmur heard. Pulmonary/Chest: Effort normal and breath sounds normal. Not tachypneic. No respiratory distress. She has no decreased breath sounds. She has no wheezes. She has no rhonchi. She has no rales.  Abdominal: Soft. Normal appearance and bowel sounds are normal. There is no tenderness.  Neurological: She is alert.  Skin: Skin is warm, dry and intact. No rash noted.  Psychiatric: Her speech is normal and behavior is normal. Judgment and thought content normal. Her mood appears not anxious. Cognition and memory are normal. She does not exhibit a depressed mood.          Assessment & Plan:

## 2012-01-14 NOTE — Addendum Note (Signed)
Addended by: Consuello Masse on: 01/14/2012 11:54 AM   Modules accepted: Orders

## 2012-01-14 NOTE — Assessment & Plan Note (Signed)
No sign of bacterial infection. Likely viral URI.. Or congestion PND from allergies.  Will treat with mucinex and nasal steroid spray.

## 2012-01-14 NOTE — Assessment & Plan Note (Signed)
Well controlled. Continue current medication.  

## 2012-01-14 NOTE — Patient Instructions (Addendum)
Can use mucinex for congestion. No decongestant. Continue claritin . Can try nasal fluticasone.  Keep following BPs. Increase atenolol back up if BP are trending up.  Follow up for CPX with labs prior in 10/2012.

## 2012-01-24 ENCOUNTER — Encounter: Payer: Self-pay | Admitting: Family Medicine

## 2012-01-24 ENCOUNTER — Ambulatory Visit: Payer: Medicare Other | Admitting: Family Medicine

## 2012-01-24 ENCOUNTER — Telehealth: Payer: Self-pay | Admitting: Family Medicine

## 2012-01-24 ENCOUNTER — Ambulatory Visit (INDEPENDENT_AMBULATORY_CARE_PROVIDER_SITE_OTHER): Payer: Medicare Other | Admitting: Family Medicine

## 2012-01-24 VITALS — BP 150/80 | HR 72 | Temp 97.9°F | Ht 61.0 in | Wt 125.8 lb

## 2012-01-24 DIAGNOSIS — R300 Dysuria: Secondary | ICD-10-CM

## 2012-01-24 DIAGNOSIS — N390 Urinary tract infection, site not specified: Secondary | ICD-10-CM

## 2012-01-24 LAB — POCT URINALYSIS DIPSTICK
Glucose, UA: NEGATIVE
Nitrite, UA: POSITIVE
Protein, UA: NEGATIVE
Spec Grav, UA: 1.02
Urobilinogen, UA: 0.2

## 2012-01-24 MED ORDER — SULFAMETHOXAZOLE-TMP DS 800-160 MG PO TABS: 1.0000 | ORAL_TABLET | Freq: Two times a day (BID) | ORAL | Status: DC

## 2012-01-24 NOTE — Patient Instructions (Addendum)
Push fluids, rest, complete antibiotics. Call if you are not improving in 3 days.

## 2012-01-24 NOTE — Progress Notes (Signed)
  Subjective:    Patient ID: [name], female    DOB: 05/17/1940, 71 y.o.   MRN: [id number]  Urinary Tract Infection  This is a new problem. The current episode started today. The problem occurs every urination. The problem has been gradually worsening. The quality of the pain is described as burning. The pain is moderate. There has been no fever. She is sexually active. There is no history of pyelonephritis. Pertinent negatives include no chills, discharge, flank pain, frequency, hematuria, hesitancy, nausea, urgency or vomiting. She has tried NSAIDs for the symptoms. The treatment provided significant relief. There is no history of catheterization, kidney stones, recurrent UTIs, a single kidney, urinary stasis or a urological procedure.      Review of Systems  Constitutional: Negative for chills.  Gastrointestinal: Negative for nausea and vomiting.  Genitourinary: Negative for hesitancy, urgency, frequency, hematuria and flank pain.       Objective:   Physical Exam  Constitutional: Vital signs are normal. She appears well-developed and well-nourished. She is cooperative.  Non-toxic appearance. She does not appear ill. No distress.  HENT:  Head: Normocephalic.  Right Ear: Hearing, tympanic membrane, external ear and ear canal normal. Tympanic membrane is not erythematous, not retracted and not bulging.  Left Ear: Hearing, tympanic membrane, external ear and ear canal normal. Tympanic membrane is not erythematous, not retracted and not bulging.  Nose: No mucosal edema or rhinorrhea. Right sinus exhibits no maxillary sinus tenderness and no frontal sinus tenderness. Left sinus exhibits no maxillary sinus tenderness and no frontal sinus tenderness.  Mouth/Throat: Uvula is midline, oropharynx is clear and moist and mucous membranes are normal.  Eyes: Conjunctivae normal, EOM and lids are normal. Pupils are equal, round, and reactive to light. No foreign bodies found.  Neck: Trachea normal  and normal range of motion. Neck supple. Carotid bruit is not present. No mass and no thyromegaly present.  Cardiovascular: Normal rate, regular rhythm, S1 normal, S2 normal, normal heart sounds, intact distal pulses and normal pulses.  Exam reveals no gallop and no friction rub.   No murmur heard. Pulmonary/Chest: Effort normal and breath sounds normal. Not tachypneic. No respiratory distress. She has no decreased breath sounds. She has no wheezes. She has no rhonchi. She has no rales.  Abdominal: Soft. Normal appearance and bowel sounds are normal. There is no hepatosplenomegaly. There is no tenderness. There is no CVA tenderness.  Neurological: She is alert.  Skin: Skin is intact.  Psychiatric: Her speech is normal and behavior is normal. Judgment and thought content normal. Her mood appears not anxious. Cognition and memory are normal. She does not exhibit a depressed mood.          Assessment & Plan:

## 2012-01-24 NOTE — Assessment & Plan Note (Signed)
Uncomplicated UTI. Treat with 3 day course of sulfa/tmp.

## 2012-01-24 NOTE — Telephone Encounter (Signed)
[facility] called stating pt's husband was at pharmacy saying rx had been sent in for pt but they had not received it.  RX given to pharmacy over the phone.

## 2012-03-31 ENCOUNTER — Telehealth: Payer: Self-pay | Admitting: *Deleted

## 2012-03-31 NOTE — Telephone Encounter (Signed)
Received call from patient stating she wanted to request Dr.Magrinat for her August appt.  Confirmed appt. With Norina Buzzard for 09/21/12 at 0900.

## 2012-04-01 ENCOUNTER — Ambulatory Visit
Admission: RE | Admit: 2012-04-01 | Discharge: 2012-04-01 | Disposition: A | Discharge status: Home / Self Care | Payer: Medicare Other | Source: Ambulatory Visit | Attending: Oncology | Admitting: Oncology

## 2012-04-01 DIAGNOSIS — C50919 Malignant neoplasm of unspecified site of unspecified female breast: Secondary | ICD-10-CM

## 2012-04-01 DIAGNOSIS — E559 Vitamin D deficiency, unspecified: Secondary | ICD-10-CM

## 2012-04-13 ENCOUNTER — Encounter: Payer: Self-pay | Admitting: *Deleted

## 2012-04-13 ENCOUNTER — Encounter: Payer: Self-pay | Admitting: Oncology

## 2012-04-13 NOTE — Progress Notes (Signed)
Mailed letter & calendar to pt. 

## 2012-07-15 ENCOUNTER — Telehealth: Payer: Self-pay

## 2012-07-15 NOTE — Telephone Encounter (Signed)
Pt seen at ear doctor's office today; @ 2:30 pm BP 185/90. Pt was advised to contact PCP; pt went home rested and now  BP 145/82. No complaints, no CP,SOB,H/A or dizziness. Pt taking atenolol 75 mg daily and has not missed any med.[facility].Please advise.

## 2012-07-15 NOTE — Telephone Encounter (Signed)
Have her check her BP every day for the next 5 days and f/u with Dr. Ermalene Searing next week

## 2012-07-15 NOTE — Telephone Encounter (Signed)
Patient advised and has appt on next Tuesday to see dr Ermalene Searing

## 2012-07-16 ENCOUNTER — Other Ambulatory Visit: Payer: Self-pay | Admitting: Otolaryngology

## 2012-07-16 DIAGNOSIS — H8000 Otosclerosis involving oval window, nonobliterative, unspecified ear: Secondary | ICD-10-CM

## 2012-07-16 DIAGNOSIS — H8020 Cochlear otosclerosis, unspecified ear: Secondary | ICD-10-CM

## 2012-07-16 DIAGNOSIS — H905 Unspecified sensorineural hearing loss: Secondary | ICD-10-CM

## 2012-07-17 ENCOUNTER — Ambulatory Visit
Admission: RE | Admit: 2012-07-17 | Discharge: 2012-07-17 | Disposition: A | Discharge status: Home / Self Care | Payer: Medicare Other | Source: Ambulatory Visit | Attending: Otolaryngology | Admitting: Otolaryngology

## 2012-07-17 DIAGNOSIS — H8000 Otosclerosis involving oval window, nonobliterative, unspecified ear: Secondary | ICD-10-CM

## 2012-07-17 DIAGNOSIS — H905 Unspecified sensorineural hearing loss: Secondary | ICD-10-CM

## 2012-07-17 DIAGNOSIS — H8020 Cochlear otosclerosis, unspecified ear: Secondary | ICD-10-CM

## 2012-07-17 MED ORDER — IOHEXOL 300 MG/ML  SOLN
75.0000 mL | Freq: Once | INTRAMUSCULAR | Status: AC | PRN
  Administered 2012-07-17: 75 mL via INTRAVENOUS

## 2012-07-21 ENCOUNTER — Ambulatory Visit: Payer: Medicare Other | Admitting: Family Medicine

## 2012-08-20 ENCOUNTER — Encounter: Payer: Self-pay | Admitting: Family Medicine

## 2012-08-20 ENCOUNTER — Ambulatory Visit (INDEPENDENT_AMBULATORY_CARE_PROVIDER_SITE_OTHER): Payer: Medicare Other | Admitting: Family Medicine

## 2012-08-20 VITALS — BP 102/74 | HR 65 | Temp 97.3°F | Ht 61.0 in | Wt 128.5 lb

## 2012-08-20 DIAGNOSIS — W57XXXA Bitten or stung by nonvenomous insect and other nonvenomous arthropods, initial encounter: Secondary | ICD-10-CM | POA: Insufficient documentation

## 2012-08-20 DIAGNOSIS — R109 Unspecified abdominal pain: Secondary | ICD-10-CM | POA: Insufficient documentation

## 2012-08-20 DIAGNOSIS — R300 Dysuria: Secondary | ICD-10-CM

## 2012-08-20 DIAGNOSIS — T148 Other injury of unspecified body region: Secondary | ICD-10-CM

## 2012-08-20 LAB — POCT URINALYSIS DIPSTICK
Bilirubin, UA: NEGATIVE
Blood, UA: NEGATIVE
Glucose, UA: NEGATIVE
Ketones, UA: NEGATIVE
Spec Grav, UA: 1.02
pH, UA: 6

## 2012-08-20 MED ORDER — TRIAMCINOLONE ACETONIDE 0.1 % EX CREA: TOPICAL_CREAM | Freq: Two times a day (BID) | CUTANEOUS | Status: DC

## 2012-08-20 NOTE — Patient Instructions (Addendum)
Decrease tomatos. Increase water. If pain returns.. Gentle stretching and ibuprofen prn. Apply topical steroid twice daily x 2 weeks to bites.

## 2012-08-20 NOTE — Assessment & Plan Note (Signed)
May have been MSK strain. UA is clear.  USe ibuprofen prn. Gentle stretching.

## 2012-08-20 NOTE — Progress Notes (Signed)
  Subjective:    Patient ID: [name], female    DOB: 1940/12/18, 72 y.o.   MRN: [id number]  Urinary Tract Infection  This is a new problem. The current episode started yesterday. The problem has been resolved. Quality: no burning with urination. The pain is at a severity of 3/10. The pain is mild. There has been no fever. She is not sexually active. There is no history of pyelonephritis. Associated symptoms include frequency and urgency. Pertinent negatives include no chills, discharge, flank pain, hematuria, nausea, possible pregnancy, sweats or vomiting. She has tried increased fluids and NSAIDs for the symptoms. The treatment provided significant relief. There is no history of catheterization, kidney stones, recurrent UTIs, a single kidney or a urological procedure.   Eating a lot if tomatos.  She has been having red inflammation at sites of multiple mosqito bites. No fever. o discharge.    Review of Systems  Constitutional: Negative for chills.  Gastrointestinal: Negative for nausea and vomiting.  Genitourinary: Positive for urgency and frequency. Negative for hematuria and flank pain.       Objective:   Physical Exam  Constitutional: Vital signs are normal. She appears well-developed and well-nourished. She is cooperative.  Non-toxic appearance. She does not appear ill. No distress.  HENT:  Head: Normocephalic.  Right Ear: Hearing, tympanic membrane, external ear and ear canal normal. Tympanic membrane is not erythematous, not retracted and not bulging.  Left Ear: Hearing, tympanic membrane, external ear and ear canal normal. Tympanic membrane is not erythematous, not retracted and not bulging.  Nose: No mucosal edema or rhinorrhea. Right sinus exhibits no maxillary sinus tenderness and no frontal sinus tenderness. Left sinus exhibits no maxillary sinus tenderness and no frontal sinus tenderness.  Mouth/Throat: Uvula is midline, oropharynx is clear and moist and mucous membranes  are normal.  Eyes: Conjunctivae, EOM and lids are normal. Pupils are equal, round, and reactive to light. No foreign bodies found.  Neck: Trachea normal and normal range of motion. Neck supple. Carotid bruit is not present. No mass and no thyromegaly present.  Cardiovascular: Normal rate, regular rhythm, S1 normal, S2 normal, normal heart sounds, intact distal pulses and normal pulses.  Exam reveals no gallop and no friction rub.   No murmur heard. Pulmonary/Chest: Effort normal and breath sounds normal. Not tachypneic. No respiratory distress. She has no decreased breath sounds. She has no wheezes. She has no rhonchi. She has no rales.  Abdominal: Soft. Normal appearance and bowel sounds are normal. There is no tenderness.  Neurological: She is alert.  Skin: Skin is warm, dry and intact. No rash noted.  Small amount of erythema around several bites on arms  Psychiatric: Her speech is normal and behavior is normal. Judgment and thought content normal. Her mood appears not anxious. Cognition and memory are normal. She does not exhibit a depressed mood.          Assessment & Plan:

## 2012-08-20 NOTE — Assessment & Plan Note (Signed)
Treat local inflammatory reaction with topical steroid.

## 2012-09-04 ENCOUNTER — Other Ambulatory Visit: Payer: Medicare Other | Admitting: Lab

## 2012-09-11 ENCOUNTER — Ambulatory Visit: Payer: Medicare Other | Admitting: Oncology

## 2012-09-21 ENCOUNTER — Telehealth: Payer: Self-pay | Admitting: Oncology

## 2012-09-21 ENCOUNTER — Encounter: Payer: Self-pay | Admitting: Family

## 2012-09-21 ENCOUNTER — Ambulatory Visit (HOSPITAL_BASED_OUTPATIENT_CLINIC_OR_DEPARTMENT_OTHER): Payer: Medicare Other | Admitting: Family

## 2012-09-21 VITALS — BP 137/83 | HR 76 | Temp 98.1°F | Resp 18 | Ht 61.0 in | Wt 126.3 lb

## 2012-09-21 DIAGNOSIS — Z853 Personal history of malignant neoplasm of breast: Secondary | ICD-10-CM

## 2012-09-21 DIAGNOSIS — M899 Disorder of bone, unspecified: Secondary | ICD-10-CM

## 2012-09-21 NOTE — Progress Notes (Signed)
[facility]  [phone] [phone]  OFFICE PROGRESS NOTE   ID: [name]   DOB: November 12, 1940  MR#: [id number]  IEP#:[id number]   PCP: Kerby Nora, MD GYN:  SU: Avel Peace, M.D RAD YSA:[id number] Murray, M.D. NEURO:  Maximino Sarin, M.D.   HISTORY OF PRESENT ILLNESS: From Dr. Theron Arista Rubin's the patient evaluation note dated 05/11/2003: "[name] is a 72 year old woman from [place]. This woman has been in good health all of her life.  She has a history of mild hypertension, on atenolol. She apparently undergoes routine screening mammography on a yearly basis.  She had an annual mammogram which showed an abnormality in the 12 o'clock position. This was nonpalpable. She had a spot compression view and ultrasound on 04/22/03.  This showed a 2 mm focus of shadowing at 12 o'clock position, 7 cm from the nipple.  Biopsy was performed in this area on 03/25/03.  Pathology did show this to be invasive mammary carcinoma.  Bilateral MRI scans of the breast showed irregular mass measuring 9 x 5 x 9 mm in the upper outer quadrant of the right breast. She underwent a lumpectomy and sentinel lymph node evaluation on 04/18/03.  This showed an invasive ductal carcinoma Grade 1 of 3, measuring 0.6 cm.  Lymphovascular invasion was not seen.  Two sentinel lymph nodes were identified, both of which were negative for disease.  This is ER positive at 80%, PR positive at 69%.  HER-2/neu was 1+ and the proliferative index was 10%.  Both of her sentinel lymph nodes were negative.  As noted, this was a T1b N0 breast cancer.  [name] has had an unremarkable postoperative course."  Her subsequent history is as detailed below.  INTERVAL HISTORY: Dr. Darnelle Catalan and I saw [name] today for followup of invasive ductal carcinoma of the right breast.  The patient was last seen by Dr. Donnie Coffin on 09/12/2011.  Since her last office visit, the patient has been doing relatively well.  She is  establishing herself with Dr. Darrall Dears service today.  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 Maximino Sarin neurologist.  The patient states her hearing sounds "distorted."  The patient notes her history of bilateral stapedectomy.  [name] denies any other symptomatology.   PAST MEDICAL HISTORY: Past Medical History  Diagnosis Date  . Hypertension   . Breast cancer 04/2003    Invasive ductal carcinoma of the right breast  . Osteopenia     PAST SURGICAL HISTORY: Past Surgical History  Procedure Laterality Date  . Breast surgery  2005    LUMPECTOMY  . Stapendectomy      BILATERAL  . Episiotomy    . Hysteroscopy w/d&c    . Proctoscopy    . Proctosomy repair      FAMILY HISTORY Family History  Problem Relation Age of Onset  . Stroke Father   . Hypertension Mother   . Stroke Sister   . Cancer Brother     Esophageal cancer  . Cancer Brother     Lung smoker  . Parkinson's disease Brother   . Stroke Brother   Father died of brain hemorrhage.  Mother died at age 1.  She has multiple siblings.  One brother died of cancer of the esophagus.  Another brother died of lung cancer.  She has another brother who had a stroke  and MI.  Sister with a history of stroke.     GYNECOLOGIC HISTORY: Gravida 2, para 1, one miscarriage, menarche age 44, age at first pregnancy  62, menopause in early forties. No history of breastfeeding.  The patient states she used hormone replacement therapy for 5 years and birth control pills for 2 years.     SOCIAL HISTORY: The patient has been married since 1961.  Her husband [name] is semiretired and is currently working for [employer].  He is the Environmental consultant.  [name] retired in 2007 from [employer].They have one adult son who lives in [place], [place].   They have one  grandson in college at [employer].  In her spare time she enjoys traveling to the beach, taking care of her 61 year old father-in-law whom she describes as "active,"  and attending church services at [facility].   ADVANCED DIRECTIVES: In place  HEALTH MAINTENANCE: History  Substance Use Topics  . Smoking status: Never Smoker   . Smokeless tobacco: Never Used  . Alcohol Use: No    Colonoscopy: 06/24/2005 PAP: 08/05/2007 Bone density: The patient's last bone density scan on 04/29/2011 showed a T score of -1.4 (osteopenia). Lipid panel: 10/10/2011  Allergies  Allergen Reactions  . Penicillins     REACTION: Rash    Current Outpatient Prescriptions  Medication Sig Dispense Refill  . aspirin 81 MG tablet Take 81 mg by mouth daily.        Marland Kitchen atenolol (TENORMIN) 50 MG tablet Take one and one half tablet by mouth daily  45 tablet  11  . Cholecalciferol (VITAMIN D-3) 1000 UNITS CAPS Take 1 capsule by mouth daily.      . fluticasone (FLONASE) 50 MCG/ACT nasal spray Place 2 sprays into the nose daily.  16 g  6  . Krill Oil 300 MG CAPS Take 1 capsule by mouth daily.        . Red Yeast Rice Extract (RED YEAST RICE PO) Take 1,200 mg by mouth daily.      . sodium fluoride-calcium carbonate (FLORICAL) 8.3-364 MG CAPS Take 1 capsule by mouth 2 (two) times daily.      Marland Kitchen triamterene-hydrochlorothiazide (DYAZIDE) 37.5-25 MG per capsule Take 1 capsule by mouth every morning.        No current facility-administered medications for this visit.    OBJECTIVE: Filed Vitals:   09/21/12 0907  BP: 137/83  Pulse: 76  Temp: 98.1 F (36.7 C)  Resp: 18     Body mass index is 23.88 kg/(m^2).      ECOG FS: 1 - Symptomatic but completely ambulatory  General appearance: Alert, cooperative, well nourished, no apparent distress Head: Normocephalic, without obvious abnormality, atraumatic, HOH, bilateral hearing aids, dentures Eyes: Arcus senilis, PERRLA, EOMI Nose: Nares, septum and  mucosa are normal, no drainage or sinus tenderness Neck: No adenopathy, supple, symmetrical, trachea midline, no tenderness Resp: Clear to auscultation bilaterally Cardio: Regular rate and rhythm, S1, S2 normal, no murmur, click, rub or gallop Breasts: Right breast and axillary area have well-healed surgical scars, no nipple inversion, bilateral axillary fullness GI: Soft, slight distention, non-tender, hypoactive bowel sounds, no organomegaly Extremities: Extremities normal, atraumatic, no cyanosis or edema Lymph nodes: Cervical, supraclavicular, and axillary nodes normal Neurologic: Grossly normal   LAB RESULTS: Lab Results  Component Value Date   WBC 6.7 09/02/2011   NEUTROABS 4.0 09/02/2011   HGB 14.3 09/02/2011   HCT 42.3 09/02/2011   MCV 86.7  09/02/2011   PLT 175 09/02/2011      Chemistry      Component Value Date/Time   NA 141 10/10/2011 0930   K 4.0 10/10/2011 0930   CL 107 10/10/2011 0930   CO2 26 10/10/2011 0930   BUN 18 07/16/2012 1344   CREATININE 0.76 07/16/2012 1344   CREATININE 0.8 10/10/2011 0930      Component Value Date/Time   CALCIUM 9.1 10/10/2011 0930   ALKPHOS 38* 10/10/2011 0930   AST 21 10/10/2011 0930   ALT 15 10/10/2011 0930   BILITOT 0.6 10/10/2011 0930       Lab Results  Component Value Date   LABCA2 27 09/06/2010    Urinalysis    Component Value Date/Time   COLORURINE yellow 08/02/2008 1148   APPEARANCEUR Clear 08/02/2008 1148   LABSPEC 1.015 08/02/2008 1148   PHURINE 6.5 08/02/2008 1148   HGBUR trace-lysed 08/02/2008 1148   BILIRUBINUR neg 08/20/2012 1224   BILIRUBINUR negative 08/02/2008 1148   UROBILINOGEN negative 08/20/2012 1224   UROBILINOGEN 0.2 08/02/2008 1148   NITRITE negative 08/20/2012 1224   NITRITE negative 08/02/2008 1148   LEUKOCYTESUR Negative 08/20/2012 1224    STUDIES: 1.  The patient's last bone density scan on 04/29/2011 showed a T score of -1.4 (osteopenia).  2.  The patient's last bilateral digital screening mammogram on 04/01/2012 showed  there is a scattered fibroglandular pattern.  No suspicious masses, architectural distortion, or calcifications are present. There are stable lumpectomy changes in the right breast.  No mammographic evidence of malignancy.   ASSESSMENT: Mrs. Mazzarella is a 72 y.o. [place]: 1.  The patient had a right breast needle core biopsy at the 12:00 position on 03/25/2003 which showed invasive mammary carcinoma.  2.  The patient had a bilateral breast MRI on 04/11/2003 which showed breast parenchyma is dense fibroglandular. Multiple 2-3 mm nodular densities were seen throughout the breasts bilaterally, nonspecific in appearance. In addition, the upper outer quadrant of the right breast there was an irregular mass measuring 9 x 5 x 9 mm. This does show early enhancement followed by wash-out, consistent with malignant process. No other masses or areas of suspicious enhancement were seen to suggest the presence of malignancy  (clinical stage I, T1 N0).  3.  Status post right breasts needle localization and excision lumpectomy with right axillary lymph node biopsy on 04/18/2003 for a stage I, pT1b, pN0(i-), pMX, 0.6 cm invasive ductal carcinoma with surgical margins free of tumor, grade 1, estrogen receptor 80% positive, progesterone receptor 69% positive, Ki-67 10%, HER-2/neu negative, with 0/2 metastatic right axillary lymph nodes.  4.  The patient had radiation therapy from 05/23/2003 through 07/06/2003.  5.  The patient became a participant in the [place] 27 and 07/2003 study and was randomized to Arimidex.    6.  She started antiestrogen therapy with Arimidex in 09/2003.  5 years of antiestrogen therapy was completed and discontinued in 07/2008.  7.  Osteopenia  PLAN: The patient is over 9 years from her time of diagnosis and will officially become a graduate of [facility]'s breast cancer program today. We asked that she continue annual clinical breast examinations by a physician in addition to  annual mammography.  Her last mammogram results are listed above.  We will schedule her annual bilateral screening mammogram (due in 03/2013) and bone density scan (due in 04/2013) for her.  The patient will continue to take vitamin D3 1000 IUs by mouth daily in addition to daily exercise  for osteopenia.  All questions were answered.  The patient was encouraged to contact us with any problems, questions or concerns.   Larina Bras, NP-C 09/21/2012, 5:40 PM  ADDENDUM: This 72 year old [place] woman is established herself in my practice today. We reviewed her diagnosis, treatment history, and prognosis. In brief:  She underwent right lumpectomy and sentinel lymph node sampling March of 2005 for a pT1b pN0, stage IA invasive ductal carcinoma, grade 1, estrogen receptor 88% positive, progesterone receptor 69% positive, with an MIB-1 of 10% and no HER-2 amplification.  She received adjuvant radiation completed June of 2005 and enrolled in the [place] 27 study at that time, randomized to anastrozole. She completed 5 years of anastrozole in June of 2000 and.  At this point, I am comfortable releasing her from followup here.  She will need yearly mammography and a yearly physician breast exam. As of now, however, we are not making routine return appointment for her here.She understands we keep a record for an additional 10 years and that we will be available to her for any questions or concerns.   I personally saw this patient and performed a substantive portion of this encounter with the listed APP documented above.   Ruthann Cancer, M.D.

## 2012-09-21 NOTE — Patient Instructions (Addendum)
Please contact us at [phone] if you have any questions or concerns.  Please continue to do well and enjoy life!!!  Get plenty of rest, drink plenty of water, exercise daily (walking), eat a balanced diet.  Continue to take calcium daily in addition to vitamin D3 1000 IUs daily.   Complete monthly self-breast examinations.  Have a clinical breast exam by a physician every year.  Have your mammogram completed every year.

## 2012-09-30 ENCOUNTER — Other Ambulatory Visit: Payer: Self-pay | Admitting: Otolaryngology

## 2012-09-30 DIAGNOSIS — H8020 Cochlear otosclerosis, unspecified ear: Secondary | ICD-10-CM

## 2012-09-30 DIAGNOSIS — H905 Unspecified sensorineural hearing loss: Secondary | ICD-10-CM

## 2012-10-12 ENCOUNTER — Ambulatory Visit
Admission: RE | Admit: 2012-10-12 | Discharge: 2012-10-12 | Disposition: A | Discharge status: Home / Self Care | Payer: Self-pay | Source: Ambulatory Visit | Attending: Otolaryngology | Admitting: Otolaryngology

## 2012-10-12 DIAGNOSIS — H905 Unspecified sensorineural hearing loss: Secondary | ICD-10-CM

## 2012-10-12 DIAGNOSIS — H8020 Cochlear otosclerosis, unspecified ear: Secondary | ICD-10-CM

## 2012-11-03 ENCOUNTER — Other Ambulatory Visit: Payer: Self-pay | Admitting: Family Medicine

## 2012-11-15 ENCOUNTER — Telehealth: Payer: Self-pay | Admitting: Family Medicine

## 2012-11-15 DIAGNOSIS — I10 Essential (primary) hypertension: Secondary | ICD-10-CM

## 2012-11-15 DIAGNOSIS — M899 Disorder of bone, unspecified: Secondary | ICD-10-CM

## 2012-11-15 DIAGNOSIS — E7800 Pure hypercholesterolemia, unspecified: Secondary | ICD-10-CM

## 2012-11-15 NOTE — Telephone Encounter (Signed)
Message copied by Excell Seltzer on Sun Nov 15, 2012 11:36 PM ------      Message from: Alvina Chou      Created: Wed Nov 11, 2012  3:06 PM      Regarding: Lab orders for Monday, 10.13.14       Patient is scheduled for CPX labs, please order future labs, Thanks , Terri       ------

## 2012-11-17 ENCOUNTER — Other Ambulatory Visit (INDEPENDENT_AMBULATORY_CARE_PROVIDER_SITE_OTHER): Payer: Medicare Other

## 2012-11-17 DIAGNOSIS — I10 Essential (primary) hypertension: Secondary | ICD-10-CM

## 2012-11-17 DIAGNOSIS — M899 Disorder of bone, unspecified: Secondary | ICD-10-CM

## 2012-11-17 DIAGNOSIS — E7800 Pure hypercholesterolemia, unspecified: Secondary | ICD-10-CM

## 2012-11-17 LAB — LIPID PANEL
Cholesterol: 219 mg/dL — ABNORMAL HIGH (ref 0–200)
HDL: 45.6 mg/dL (ref >39.00)
Triglycerides: 156 mg/dL — ABNORMAL HIGH (ref 0.0–149.0)

## 2012-11-17 LAB — COMPREHENSIVE METABOLIC PANEL
Alkaline Phosphatase: 39 U/L (ref 39–117)
BUN: 19 mg/dL (ref 6–23)
Chloride: 106 mEq/L (ref 96–112)
Creatinine, Ser: 0.9 mg/dL (ref 0.4–1.2)
GFR: 69.38 mL/min (ref >60.00)
Glucose, Bld: 97 mg/dL (ref 70–99)
Total Bilirubin: 1 mg/dL (ref 0.3–1.2)

## 2012-11-17 LAB — LDL CHOLESTEROL, DIRECT: Direct LDL: 148.1 mg/dL

## 2012-11-18 LAB — VITAMIN D 25 HYDROXY (VIT D DEFICIENCY, FRACTURES): Vit D, 25-Hydroxy: 46 ng/mL (ref 30–89)

## 2012-11-24 ENCOUNTER — Encounter: Payer: Self-pay | Admitting: Family Medicine

## 2012-11-24 ENCOUNTER — Ambulatory Visit (INDEPENDENT_AMBULATORY_CARE_PROVIDER_SITE_OTHER): Payer: Medicare Other | Admitting: Family Medicine

## 2012-11-24 VITALS — BP 166/86 | HR 65 | Temp 98.0°F | Ht 61.0 in | Wt 124.5 lb

## 2012-11-24 DIAGNOSIS — E7800 Pure hypercholesterolemia, unspecified: Secondary | ICD-10-CM

## 2012-11-24 DIAGNOSIS — Z853 Personal history of malignant neoplasm of breast: Secondary | ICD-10-CM

## 2012-11-24 DIAGNOSIS — M899 Disorder of bone, unspecified: Secondary | ICD-10-CM

## 2012-11-24 DIAGNOSIS — H8093 Unspecified otosclerosis, bilateral: Secondary | ICD-10-CM | POA: Insufficient documentation

## 2012-11-24 DIAGNOSIS — Z0000 Encounter for general adult medical examination without abnormal findings: Secondary | ICD-10-CM

## 2012-11-24 DIAGNOSIS — K644 Residual hemorrhoidal skin tags: Secondary | ICD-10-CM

## 2012-11-24 DIAGNOSIS — H8090 Unspecified otosclerosis, unspecified ear: Secondary | ICD-10-CM

## 2012-11-24 DIAGNOSIS — I10 Essential (primary) hypertension: Secondary | ICD-10-CM

## 2012-11-24 DIAGNOSIS — Z23 Encounter for immunization: Secondary | ICD-10-CM

## 2012-11-24 MED ORDER — HYDROCORTISONE ACE-PRAMOXINE 2.5-1 % RE CREA: TOPICAL_CREAM | Freq: Three times a day (TID) | RECTAL | Status: DC

## 2012-11-24 NOTE — Assessment & Plan Note (Signed)
Stable

## 2012-11-24 NOTE — Assessment & Plan Note (Signed)
Follow at home. Validate cuff.  Call if > 140/90.

## 2012-11-24 NOTE — Patient Instructions (Addendum)
Check blood pressure cuff at home to make sure accurate.  Call if BP > 140/90.  Increase red yeast rice to 600 mg 2 tabs twice daily.

## 2012-11-24 NOTE — Assessment & Plan Note (Signed)
INCrease red yeast rice to 2 tabs po BID for better cholesterol control. Rechek in 1 year.

## 2012-11-24 NOTE — Assessment & Plan Note (Signed)
Refilled med

## 2012-11-24 NOTE — Assessment & Plan Note (Signed)
Vit D in nml range. 

## 2012-11-24 NOTE — Progress Notes (Signed)
HPI  I have personally reviewed the Medicare Annual Wellness questionnaire and have noted  1. The patient's medical and social history  2. Their use of alcohol, tobacco or illicit drugs  3. Their current medications and supplements  4. The patient's functional ability including ADL's, fall risks, home safety risks and hearing or visual  impairment.  5. Diet and physical activities  6. Evidence for depression or mood disorders  The patients weight, height, BMI and visual acuity have been recorded in the chart  I have made referrals, counseling and provided education to the patient based review of the above and I have provided the pt with a written personalized care plan for preventive services.   She has noted hearing loss worsening... Follwoed by Dr. Jac Canavan.Marland Kitchen otoscleroisis.  Elevated liver transaminases, mild: Was on OTC statin medication, now resolved.  Hx of breast adenocarcinoma:  Has had breast exam at Dr. Darnelle Catalan [facility]....just saw On[facility] 09/21/2012  10 years breast cancer free next year..  Mammogram nml in 04/01/2012  released from there this year. .   Hypertension: Poor control  today on atenolol. She states she has never been on dyazide. I see no record of this either ( stricken from med list today) AShe has an element of white coat HTN, see below numbers. BP Readings from Last 3 Encounters:  11/24/12 166/86  09/21/12 137/83  08/20/12 102/74  Using medication without problems or lightheadedness:  Chest pain with exertion: None  Edema:None  Short of breath:None  Average home BPs: 122/67-128/62, HR 64-73..  Walking 5 days a week. Diet:healthy. Low fat diet, fruits and veggies.   Elevated Cholesterol: LDL not at goal < 130, on omega three fatty acids and red yeast rice but not using regularly. Lab Results  Component Value Date   CHOL 219* 11/17/2012   HDL 45.60 11/17/2012   LDLCALC 116* 08/23/2010   LDLDIRECT 148.1 11/17/2012   TRIG 156.0* 11/17/2012   CHOLHDL 5 11/17/2012   Nml EKG in 2009.   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 Needs refill of rectal cream.  Review of Systems  Constitutional: Negative for fever and fatigue.  HENT: Positive for postnasal drip. Negative for ear pain, sneezing and neck pain.  Wears hearing aids, hx of stapendectomy  No chest [pain, NO SOB  Eyes: Negative for pain.  Gastrointestinal: Positive for blood in stool. Negative for abdominal pain, diarrhea, constipation and abdominal distention.  Only occ with hemmorohoids acting up  Genitourinary: Negative for dysuria, hematuria and vaginal bleeding.  Musculoskeletal: Negative for arthralgias.  Skin: Negative for rash.  Psychiatric/Behavioral: Negative for behavioral problems. The patient is not nervous/anxious.  Objective:   Physical Exam  Constitutional: Vital signs are normal. She appears well-developed and well-nourished. She is cooperative. Non-toxic appearance. She does not appear ill. No distress.  HENT:  Head: Normocephalic.  Right Ear: Tympanic membrane, external ear and ear canal normal. No drainage. Tympanic membrane is not injected, not scarred, not perforated, not erythematous, not retracted and not bulging. No middle ear effusion.  Left Ear: External ear and ear canal normal. No drainage. Tympanic membrane is not injected, not scarred, not perforated, not erythematous, not retracted and not bulging. Nose: Nose normal.  Mild scarring B TMs from past surgical procedure.  Eyes: Conjunctivae, EOM and lids are normal. Pupils are equal, round, and reactive to light. No foreign bodies found.  Neck: Trachea normal and normal range of motion. Neck supple. Carotid bruit is not present. No mass and no thyromegaly present.  Cardiovascular: Normal  rate, regular rhythm, S1 normal, S2 normal, normal heart sounds and intact distal pulses. Exam reveals no gallop.  No murmur heard.  Pulmonary/Chest: Effort normal and breath sounds normal. No respiratory distress. She has no wheezes. She has  no rhonchi. She has no rales.  Abdominal: Soft. Normal appearance and bowel sounds are normal. She exhibits no distension, no fluid wave, no abdominal bruit and no mass. There is no hepatosplenomegaly. There is no tenderness. There is no rebound, no guarding and no CVA tenderness. No hernia.  Genitourinary:  PAP and DVE not indicated. Breast exam was performed by Dr. Donnie Coffin in 09/2011  Lymphadenopathy:  She has no cervical adenopathy.  She has no axillary adenopathy.  Neurological: She is alert. She has normal strength. No cranial nerve deficit or sensory deficit.  Skin: Skin is warm, dry and intact. No rash noted.  Psychiatric: Her speech is normal and behavior is normal. Judgment normal. Her mood appears not anxious. Cognition and memory are normal. She does not exhibit a depressed mood.  Assessment & Plan:   Annual Medicare Wellness:  The patient's preventative maintenance and recommended screening tests for an annual wellness exam were reviewed in full today.  Brought up to date unless services declined.  Counselled on the importance of diet, exercise, and its role in overall health and mortality.  The patient's FH and SH was reviewed, including their home life, tobacco status, and drug and alcohol status.   No DVE or PAP required given age and no family history of Ov/uterine cancer, no vaginal symptoms.  UTD with vaccines  Colonoscopy: 2007, next due 2017  Mammo: 04/01/2012 DEXA: The patient's last bone density scan on 04/29/2011 showed a T score of -1.4 (osteopenia), not interested in fosamax.

## 2012-12-01 ENCOUNTER — Other Ambulatory Visit: Payer: Self-pay | Admitting: Family Medicine

## 2013-03-02 ENCOUNTER — Encounter: Payer: Self-pay | Admitting: Family Medicine

## 2013-03-02 ENCOUNTER — Ambulatory Visit (INDEPENDENT_AMBULATORY_CARE_PROVIDER_SITE_OTHER): Payer: Medicare Other | Admitting: Family Medicine

## 2013-03-02 VITALS — BP 159/88 | HR 65 | Temp 97.8°F | Ht 61.0 in | Wt 126.8 lb

## 2013-03-02 DIAGNOSIS — J309 Allergic rhinitis, unspecified: Secondary | ICD-10-CM

## 2013-03-02 DIAGNOSIS — I10 Essential (primary) hypertension: Secondary | ICD-10-CM

## 2013-03-02 MED ORDER — FLUTICASONE PROPIONATE 50 MCG/ACT NA SUSP: 2.0000 | Freq: Every day | NASAL | Status: DC

## 2013-03-02 MED ORDER — LOSARTAN POTASSIUM-HCTZ 50-12.5 MG PO TABS: 1.0000 | ORAL_TABLET | Freq: Every day | ORAL | Status: DC

## 2013-03-02 NOTE — Patient Instructions (Signed)
Start losartan/HCTZ daily in AM.  Stop atenolol.  Follow BP at home.  Follow up BP check in 2 weeks. Start fluticasone for nasal congestion, call if fever and face pain.

## 2013-03-02 NOTE — Assessment & Plan Note (Signed)
Inadequate control.  Change atenolol to losartan HCTZ, follow up in 2 weeks for creatinine check and BP check. May need to increase medication.

## 2013-03-02 NOTE — Assessment & Plan Note (Signed)
Causing post nasal drip. Treat fluticasone.  no sign of bacterial infection.  If not improving as expected consider further treatment.

## 2013-03-02 NOTE — Progress Notes (Signed)
Pre-visit discussion using our clinic review tool. No additional management support is needed unless otherwise documented below in the visit note.  

## 2013-03-02 NOTE — Progress Notes (Signed)
   Subjective:    Patient ID: [name], female    DOB: 02/26/1940, 73 y.o.   MRN: [id number]  HPI 73 year old female presents with continue BP elevations on atenolol. She has tried two different machines. Home BPs 143-154/78-95 HR 68-72  She has increased atenolol on her own to 100 mg daily... No change in medicaiton.   BP Readings from Last 3 Encounters:  03/02/13 159/88  11/24/12 166/86  09/21/12 137/83   Using medication without problems or lightheadedness:  None Chest pain with exertion: None Edema:None Short of breath:None Later in the day she has ear fullness, occ headaches. Other issues:  She has been having thick nasal mucus in , post nasal drip, clearing throat... Ongoing for months, worse lately. Hoarse voice lately.  Has not started nasal steroid .  No fever, no face pain.  Ear pressure s/p stapendectomy.   Review of Systems  Constitutional: Negative for fever and fatigue.  HENT: Negative for ear pain.   Eyes: Negative for pain.  Respiratory: Negative for chest tightness and shortness of breath.   Cardiovascular: Negative for chest pain, palpitations and leg swelling.  Gastrointestinal: Negative for abdominal pain.  Genitourinary: Negative for dysuria.       Objective:   Physical Exam  Constitutional: Vital signs are normal. She appears well-developed and well-nourished. She is cooperative.  Non-toxic appearance. She does not appear ill. No distress.  HENT:  Head: Normocephalic.  Right Ear: Hearing, tympanic membrane, external ear and ear canal normal. Tympanic membrane is not erythematous, not retracted and not bulging.  Left Ear: Hearing, tympanic membrane, external ear and ear canal normal. Tympanic membrane is not erythematous, not retracted and not bulging.  Nose: Mucosal edema present. No rhinorrhea. Right sinus exhibits no maxillary sinus tenderness and no frontal sinus tenderness. Left sinus exhibits no maxillary sinus tenderness and no frontal  sinus tenderness.  Mouth/Throat: Uvula is midline, oropharynx is clear and moist and mucous membranes are normal.  Eyes: Conjunctivae, EOM and lids are normal. Pupils are equal, round, and reactive to light. Lids are everted and swept, no foreign bodies found.  Neck: Trachea normal and normal range of motion. Neck supple. Carotid bruit is not present. No mass and no thyromegaly present.  Cardiovascular: Normal rate, regular rhythm, S1 normal, S2 normal, normal heart sounds, intact distal pulses and normal pulses.  Exam reveals no gallop and no friction rub.   No murmur heard. Pulmonary/Chest: Effort normal and breath sounds normal. Not tachypneic. No respiratory distress. She has no decreased breath sounds. She has no wheezes. She has no rhonchi. She has no rales.  Abdominal: Soft. Normal appearance and bowel sounds are normal. There is no tenderness.  Neurological: She is alert.  Skin: Skin is warm, dry and intact. No rash noted.  Psychiatric: Her speech is normal and behavior is normal. Judgment and thought content normal. Her mood appears not anxious. Cognition and memory are normal. She does not exhibit a depressed mood.          Assessment & Plan:

## 2013-03-10 ENCOUNTER — Telehealth: Payer: Self-pay | Admitting: Family Medicine

## 2013-03-10 NOTE — Telephone Encounter (Signed)
Relevant patient education mailed to patient.  

## 2013-03-16 ENCOUNTER — Encounter: Payer: Self-pay | Admitting: Family Medicine

## 2013-03-16 ENCOUNTER — Ambulatory Visit (INDEPENDENT_AMBULATORY_CARE_PROVIDER_SITE_OTHER): Payer: Medicare Other | Admitting: Family Medicine

## 2013-03-16 VITALS — BP 118/82 | HR 92 | Temp 98.0°F | Ht 61.0 in | Wt 126.2 lb

## 2013-03-16 DIAGNOSIS — I10 Essential (primary) hypertension: Secondary | ICD-10-CM

## 2013-03-16 DIAGNOSIS — J309 Allergic rhinitis, unspecified: Secondary | ICD-10-CM

## 2013-03-16 LAB — BASIC METABOLIC PANEL
BUN: 23 mg/dL (ref 6–23)
CALCIUM: 9.1 mg/dL (ref 8.4–10.5)
CO2: 27 mEq/L (ref 19–32)
Chloride: 105 mEq/L (ref 96–112)
Creatinine, Ser: 0.8 mg/dL (ref 0.4–1.2)
GFR: 78.87 mL/min (ref >60.00)
Glucose, Bld: 80 mg/dL (ref 70–99)
POTASSIUM: 3.7 meq/L (ref 3.5–5.1)
Sodium: 142 mEq/L (ref 135–145)

## 2013-03-16 NOTE — Progress Notes (Signed)
Pre-visit discussion using our clinic review tool. No additional management support is needed unless otherwise documented below in the visit note.  

## 2013-03-16 NOTE — Assessment & Plan Note (Signed)
Improved control on new med.. Check creatinine and electrolytes.

## 2013-03-16 NOTE — Assessment & Plan Note (Signed)
Improved on nasal steroid.

## 2013-03-16 NOTE — Patient Instructions (Addendum)
Continue current BP medicaiton. Follow Bps at home, call if > 140/90.  We wil call with labs results.

## 2013-03-16 NOTE — Progress Notes (Signed)
   Subjective:    Patient ID: [name], female    DOB: [birth year], 72 y.o.   MRN: [id number]  HPI    73 year old female presents for 2 week follow up HTN.  Allergic rhinitis: improved with fluticasone.    Hypertension: Improved control on losartan HCTZ  And off atenolol. BP Readings from Last 3 Encounters:  03/16/13 118/82  03/02/13 159/88  11/24/12 166/86  Using medication without problems or lightheadedness: None Chest pain with exertion:None Edema:None Short of breath:None Average home BPs: 116/68, 120/68 HR 76-90 Other issues: No SE she has noted.   Review of Systems  HENT:       Continues to have severe non pulsatile tinnitus in B ears , worse later in day, o no ototoxic meds, history of otosclerosis.       Objective:   Physical Exam  Constitutional: Vital signs are normal. She appears well-developed and well-nourished. She is cooperative.  Non-toxic appearance. She does not appear ill. No distress.  HENT:  Head: Normocephalic.  Right Ear: Hearing, tympanic membrane, external ear and ear canal normal. Tympanic membrane is not erythematous, not retracted and not bulging.  Left Ear: Hearing, tympanic membrane, external ear and ear canal normal. Tympanic membrane is not erythematous, not retracted and not bulging.  Nose: No mucosal edema or rhinorrhea. Right sinus exhibits no maxillary sinus tenderness and no frontal sinus tenderness. Left sinus exhibits no maxillary sinus tenderness and no frontal sinus tenderness.  Mouth/Throat: Uvula is midline, oropharynx is clear and moist and mucous membranes are normal.  Eyes: Conjunctivae, EOM and lids are normal. Pupils are equal, round, and reactive to light. Lids are everted and swept, no foreign bodies found.  Neck: Trachea normal and normal range of motion. Neck supple. Carotid bruit is not present. No mass and no thyromegaly present.  Cardiovascular: Normal rate, regular rhythm, S1 normal, S2 normal, normal heart sounds,  intact distal pulses and normal pulses.  Exam reveals no gallop and no friction rub.   No murmur heard. Pulmonary/Chest: Effort normal and breath sounds normal. Not tachypneic. No respiratory distress. She has no decreased breath sounds. She has no wheezes. She has no rhonchi. She has no rales.  Abdominal: Soft. Normal appearance and bowel sounds are normal. There is no tenderness.  Neurological: She is alert.  Skin: Skin is warm, dry and intact. No rash noted.  Psychiatric: Her speech is normal and behavior is normal. Judgment and thought content normal. Her mood appears not anxious. Cognition and memory are normal. She does not exhibit a depressed mood.          Assessment & Plan:

## 2013-03-17 ENCOUNTER — Telehealth: Payer: Self-pay | Admitting: Family Medicine

## 2013-03-17 NOTE — Telephone Encounter (Signed)
Relevant patient education mailed to patient.  

## 2013-04-02 ENCOUNTER — Ambulatory Visit: Payer: Medicare Other

## 2013-04-02 ENCOUNTER — Other Ambulatory Visit: Payer: Medicare Other

## 2013-04-20 ENCOUNTER — Ambulatory Visit
Admission: RE | Admit: 2013-04-20 | Discharge: 2013-04-20 | Disposition: A | Discharge status: Home / Self Care | Payer: Medicare Other | Source: Ambulatory Visit | Attending: Family | Admitting: Family

## 2013-04-20 DIAGNOSIS — Z853 Personal history of malignant neoplasm of breast: Secondary | ICD-10-CM

## 2013-04-20 DIAGNOSIS — M949 Disorder of cartilage, unspecified: Principal | ICD-10-CM

## 2013-04-20 DIAGNOSIS — M899 Disorder of bone, unspecified: Secondary | ICD-10-CM

## 2013-12-23 ENCOUNTER — Encounter: Payer: Self-pay | Admitting: Family Medicine

## 2013-12-23 ENCOUNTER — Ambulatory Visit (INDEPENDENT_AMBULATORY_CARE_PROVIDER_SITE_OTHER): Payer: Medicare Other | Admitting: Family Medicine

## 2013-12-23 VITALS — BP 120/70 | HR 80 | Temp 98.2°F | Ht 61.0 in | Wt 123.0 lb

## 2013-12-23 DIAGNOSIS — M8580 Other specified disorders of bone density and structure, unspecified site: Secondary | ICD-10-CM

## 2013-12-23 DIAGNOSIS — Z7189 Other specified counseling: Secondary | ICD-10-CM

## 2013-12-23 DIAGNOSIS — Z0000 Encounter for general adult medical examination without abnormal findings: Secondary | ICD-10-CM

## 2013-12-23 DIAGNOSIS — E7800 Pure hypercholesterolemia, unspecified: Secondary | ICD-10-CM

## 2013-12-23 DIAGNOSIS — Z23 Encounter for immunization: Secondary | ICD-10-CM

## 2013-12-23 LAB — LIPID PANEL
CHOLESTEROL: 222 mg/dL — AB (ref 0–200)
HDL: 56.4 mg/dL (ref >39.00)
LDL Cholesterol: 145 mg/dL — ABNORMAL HIGH (ref 0–99)
NONHDL: 165.6
Total CHOL/HDL Ratio: 4
Triglycerides: 103 mg/dL (ref 0.0–149.0)
VLDL: 20.6 mg/dL (ref 0.0–40.0)

## 2013-12-23 LAB — COMPREHENSIVE METABOLIC PANEL
ALBUMIN: 4.2 g/dL (ref 3.5–5.2)
ALT: 22 U/L (ref 0–35)
AST: 26 U/L (ref 0–37)
Alkaline Phosphatase: 47 U/L (ref 39–117)
BILIRUBIN TOTAL: 0.8 mg/dL (ref 0.2–1.2)
BUN: 20 mg/dL (ref 6–23)
CALCIUM: 9.4 mg/dL (ref 8.4–10.5)
CO2: 26 meq/L (ref 19–32)
Chloride: 103 mEq/L (ref 96–112)
Creatinine, Ser: 0.8 mg/dL (ref 0.4–1.2)
GFR: 71.1 mL/min (ref >60.00)
GLUCOSE: 85 mg/dL (ref 70–99)
POTASSIUM: 3.9 meq/L (ref 3.5–5.1)
Sodium: 139 mEq/L (ref 135–145)
Total Protein: 7 g/dL (ref 6.0–8.3)

## 2013-12-23 LAB — VITAMIN D 25 HYDROXY (VIT D DEFICIENCY, FRACTURES): VITD: 42.97 ng/mL (ref 30.00–100.00)

## 2013-12-23 MED ORDER — LOSARTAN POTASSIUM-HCTZ 50-12.5 MG PO TABS: 1.0000 | ORAL_TABLET | Freq: Every day | ORAL | Status: DC

## 2013-12-23 NOTE — Addendum Note (Signed)
Addended by: Carter Kitten on: 12/23/2013 11:54 AM   Modules accepted: Orders

## 2013-12-23 NOTE — Patient Instructions (Signed)
Stop at lab on way out. 

## 2013-12-23 NOTE — Progress Notes (Signed)
HPI  I have personally reviewed the Medicare Annual Wellness questionnaire and have noted  1. The patient's medical and social history  2. Their use of alcohol, tobacco or illicit drugs  3. Their current medications and supplements  4. The patient's functional ability including ADL's, fall risks, home safety risks and hearing or visual  impairment.  5. Diet and physical activities  6. Evidence for depression or mood disorders  The patients weight, height, BMI and visual acuity have been recorded in the chart  I have made referrals, counseling and provided education to the patient based review of the above and I have provided the pt with a written personalized care plan for preventive services.    Elevated liver transaminases, mild: Was on  statin medication, now resolved.  Hx of breast adenocarcinoma:  Has had breast exam at [facility]...seeing prn. released 10 years breast cancer free next year..  Mammogram nml in 04/2013 .   Hypertension: Good control today on losartan/hctz.  She has an element of white coat HTN. BP Readings from Last 3 Encounters:  12/23/13 120/70  03/16/13 118/82  03/02/13 159/88  Chest pain with exertion: None  Edema:None  Short of breath:None  Average home BPs: 122/67-128/62, HR 64-73..  Walking 5 days a week. Diet:healthy. Low fat diet, fruits and veggies.   Elevated Cholesterol: LDL not at goal < 130, on omega three fatty acids and red yeast rice but not using regularly. Lab Results  Component Value Date   CHOL 219* 11/17/2012   HDL 45.60 11/17/2012   LDLCALC 116* 08/23/2010   LDLDIRECT 148.1 11/17/2012   TRIG 156.0* 11/17/2012   CHOLHDL 5 11/17/2012    Review of Systems  Constitutional: Negative for fever and fatigue.  HENT: Positive for postnasal drip. Negative for ear pain, sneezing and neck pain.  Wears hearing aids, hx of stapendectomy  No chest [pain, NO SOB  Eyes: Negative for pain.  Gastrointestinal:  Positive for blood in stool. Negative for abdominal pain, diarrhea, constipation and abdominal distention.  Only occ with hemmorohoids acting up  Genitourinary: Negative for dysuria, hematuria and vaginal bleeding.  Musculoskeletal: Negative for arthralgias.  Skin: Negative for rash.  Psychiatric/Behavioral: Negative for behavioral problems. The patient is not nervous/anxious.  Objective:   Physical Exam  Constitutional: Vital signs are normal. She appears well-developed and well-nourished. She is cooperative. Non-toxic appearance. She does not appear ill. No distress.  HENT:  Head: Normocephalic.  Right Ear: Tympanic membrane, external ear and ear canal normal. No drainage. Tympanic membrane is not injected, not scarred, not perforated, not erythematous, not retracted and not bulging. No middle ear effusion.  Left Ear: External ear and ear canal normal. No drainage. Tympanic membrane is not injected, not scarred, not perforated, not erythematous, not retracted and not bulging. Nose: Nose normal.  Mild scarring B TMs from past surgical procedure.  Eyes: Conjunctivae, EOM and lids are normal. Pupils are equal, round, and reactive to light. No foreign bodies found.  Neck: Trachea normal and normal range of motion. Neck supple. Carotid bruit is not present. No mass and no thyromegaly present.  Cardiovascular: Normal rate, regular rhythm, S1 normal, S2 normal, normal heart sounds and intact distal pulses. Exam reveals no gallop.  No murmur heard.  Pulmonary/Chest: Effort normal and breath sounds normal. No respiratory distress. She has no wheezes. She has no rhonchi. She has no rales.  Abdominal: Soft. Normal appearance and bowel sounds are normal. She exhibits no distension, no fluid wave, no abdominal bruit  and no mass. There is no hepatosplenomegaly. There is no tenderness. There is no rebound, no guarding and no CVA tenderness. No hernia.  Genitourinary:  PAP and DVE not  indicated. Breast exam was performed by Dr. Truddie Coco in 09/2011  Lymphadenopathy:  She has no cervical adenopathy.  She has no axillary adenopathy.  Neurological: She is alert. She has normal strength. No cranial nerve deficit or sensory deficit.  Skin: Skin is warm, dry and intact. No rash noted.  Psychiatric: Her speech is normal and behavior is normal. Judgment normal. Her mood appears not anxious. Cognition and memory are normal. She does not exhibit a depressed mood.  Assessment & Plan:   Annual Medicare Wellness:  The patient's preventative maintenance and recommended screening tests for an annual wellness exam were reviewed in full today.  Brought up to date unless services declined.  Counselled on the importance of diet, exercise, and its role in overall health and mortality.  The patient's FH and SH was reviewed, including their home life, tobacco status, and drug and alcohol status.   No DVE or PAP required given age and no family history of Ov/uterine cancer, no vaginal symptoms.  Vaccines: due for flu and prevnar. Colonoscopy: 2007, next due 2017  Mammo: 04/2013 nml DEXA: The patient's last bone density scan on 04/29/2011 showed a T score of -1.4 (osteopenia), not interested in fosamax. Repeat 04/2013: increase in spine and decrease in femur compared to 2013, but no change compared to earlier 2006  DXA in femur Non smoker.

## 2013-12-23 NOTE — Progress Notes (Signed)
Pre visit review using our clinic review tool, if applicable. No additional management support is needed unless otherwise documented below in the visit note. 

## 2013-12-24 ENCOUNTER — Encounter: Payer: Self-pay | Admitting: *Deleted

## 2014-02-02 ENCOUNTER — Encounter: Payer: Self-pay | Admitting: Internal Medicine

## 2014-04-04 ENCOUNTER — Other Ambulatory Visit: Payer: Self-pay

## 2014-04-04 DIAGNOSIS — Z1231 Encounter for screening mammogram for malignant neoplasm of breast: Secondary | ICD-10-CM | POA: Diagnosis not present

## 2014-04-26 ENCOUNTER — Ambulatory Visit
Admission: RE | Admit: 2014-04-26 | Discharge: 2014-04-26 | Disposition: A | Discharge status: Home / Self Care | Payer: Medicare Other | Source: Ambulatory Visit

## 2014-04-26 DIAGNOSIS — Z1231 Encounter for screening mammogram for malignant neoplasm of breast: Secondary | ICD-10-CM

## 2014-05-03 ENCOUNTER — Ambulatory Visit
Admission: RE | Admit: 2014-05-03 | Discharge: 2014-05-03 | Disposition: A | Discharge status: Home / Self Care | Payer: Medicare Other | Source: Ambulatory Visit

## 2014-05-03 ENCOUNTER — Other Ambulatory Visit: Payer: Self-pay

## 2014-05-03 ENCOUNTER — Ambulatory Visit: Admission: RE | Admit: 2014-05-03 | Payer: Medicare Other | Source: Ambulatory Visit

## 2014-05-03 DIAGNOSIS — Z1231 Encounter for screening mammogram for malignant neoplasm of breast: Secondary | ICD-10-CM | POA: Diagnosis not present

## 2014-05-05 ENCOUNTER — Other Ambulatory Visit: Payer: Self-pay | Admitting: Family Medicine

## 2014-05-05 DIAGNOSIS — R928 Other abnormal and inconclusive findings on diagnostic imaging of breast: Secondary | ICD-10-CM

## 2014-05-06 ENCOUNTER — Ambulatory Visit
Admission: RE | Admit: 2014-05-06 | Discharge: 2014-05-06 | Disposition: A | Discharge status: Home / Self Care | Payer: Medicare Other | Source: Ambulatory Visit | Attending: Family Medicine | Admitting: Family Medicine

## 2014-05-06 ENCOUNTER — Other Ambulatory Visit: Payer: Self-pay | Admitting: *Deleted

## 2014-05-06 ENCOUNTER — Other Ambulatory Visit: Payer: Medicare Other

## 2014-05-06 ENCOUNTER — Other Ambulatory Visit: Payer: Self-pay | Admitting: Family Medicine

## 2014-05-06 DIAGNOSIS — R928 Other abnormal and inconclusive findings on diagnostic imaging of breast: Secondary | ICD-10-CM

## 2014-05-06 DIAGNOSIS — N6002 Solitary cyst of left breast: Secondary | ICD-10-CM | POA: Diagnosis not present

## 2014-06-16 ENCOUNTER — Other Ambulatory Visit: Payer: Self-pay | Admitting: Family Medicine

## 2014-06-16 NOTE — Telephone Encounter (Signed)
Last office visit 12/23/2013. Not on current medication list.  Ok to refill?

## 2014-06-16 NOTE — Telephone Encounter (Signed)
Pt left v/m checking on status of hemorrhoid cream to [place]. Pt request cb.

## 2014-06-17 NOTE — Telephone Encounter (Signed)
Left message with [name] that prescription refill has been sent to [facility].

## 2014-06-23 DIAGNOSIS — H903 Sensorineural hearing loss, bilateral: Secondary | ICD-10-CM | POA: Diagnosis not present

## 2014-06-23 DIAGNOSIS — H8003 Otosclerosis involving oval window, nonobliterative, bilateral: Secondary | ICD-10-CM | POA: Diagnosis not present

## 2014-06-23 DIAGNOSIS — H8023 Cochlear otosclerosis, bilateral: Secondary | ICD-10-CM | POA: Diagnosis not present

## 2014-08-24 DIAGNOSIS — Z9849 Cataract extraction status, unspecified eye: Secondary | ICD-10-CM | POA: Diagnosis not present

## 2014-08-24 DIAGNOSIS — H43822 Vitreomacular adhesion, left eye: Secondary | ICD-10-CM | POA: Diagnosis not present

## 2014-08-24 DIAGNOSIS — H26491 Other secondary cataract, right eye: Secondary | ICD-10-CM | POA: Diagnosis not present

## 2014-08-24 DIAGNOSIS — H35033 Hypertensive retinopathy, bilateral: Secondary | ICD-10-CM | POA: Diagnosis not present

## 2014-08-24 DIAGNOSIS — H52213 Irregular astigmatism, bilateral: Secondary | ICD-10-CM | POA: Diagnosis not present

## 2014-08-24 LAB — HM DIABETES EYE EXAM

## 2014-08-29 ENCOUNTER — Encounter: Payer: Self-pay | Admitting: Family Medicine

## 2014-09-13 DIAGNOSIS — Z961 Presence of intraocular lens: Secondary | ICD-10-CM | POA: Diagnosis not present

## 2014-09-13 DIAGNOSIS — H26491 Other secondary cataract, right eye: Secondary | ICD-10-CM | POA: Diagnosis not present

## 2014-09-13 DIAGNOSIS — H18411 Arcus senilis, right eye: Secondary | ICD-10-CM | POA: Diagnosis not present

## 2014-11-23 ENCOUNTER — Ambulatory Visit: Payer: Self-pay

## 2014-11-24 ENCOUNTER — Ambulatory Visit (INDEPENDENT_AMBULATORY_CARE_PROVIDER_SITE_OTHER): Payer: Medicare Other

## 2014-11-24 DIAGNOSIS — Z23 Encounter for immunization: Secondary | ICD-10-CM

## 2014-12-02 ENCOUNTER — Ambulatory Visit: Payer: Self-pay

## 2014-12-19 ENCOUNTER — Other Ambulatory Visit: Payer: Self-pay | Admitting: Family Medicine

## 2015-03-03 ENCOUNTER — Encounter: Payer: Self-pay | Admitting: Family Medicine

## 2015-03-03 ENCOUNTER — Ambulatory Visit (INDEPENDENT_AMBULATORY_CARE_PROVIDER_SITE_OTHER): Payer: Medicare Other | Admitting: Family Medicine

## 2015-03-03 VITALS — BP 124/76 | HR 83 | Temp 97.4°F | Ht 61.0 in | Wt 128.5 lb

## 2015-03-03 DIAGNOSIS — K648 Other hemorrhoids: Secondary | ICD-10-CM

## 2015-03-03 DIAGNOSIS — E7800 Pure hypercholesterolemia, unspecified: Secondary | ICD-10-CM | POA: Diagnosis not present

## 2015-03-03 DIAGNOSIS — M545 Low back pain, unspecified: Secondary | ICD-10-CM

## 2015-03-03 DIAGNOSIS — M8580 Other specified disorders of bone density and structure, unspecified site: Secondary | ICD-10-CM | POA: Diagnosis not present

## 2015-03-03 DIAGNOSIS — N950 Postmenopausal bleeding: Secondary | ICD-10-CM | POA: Insufficient documentation

## 2015-03-03 DIAGNOSIS — Z23 Encounter for immunization: Secondary | ICD-10-CM

## 2015-03-03 DIAGNOSIS — Z0000 Encounter for general adult medical examination without abnormal findings: Secondary | ICD-10-CM

## 2015-03-03 DIAGNOSIS — I10 Essential (primary) hypertension: Secondary | ICD-10-CM

## 2015-03-03 DIAGNOSIS — Z1211 Encounter for screening for malignant neoplasm of colon: Secondary | ICD-10-CM | POA: Diagnosis not present

## 2015-03-03 DIAGNOSIS — R0982 Postnasal drip: Secondary | ICD-10-CM | POA: Insufficient documentation

## 2015-03-03 DIAGNOSIS — M5416 Radiculopathy, lumbar region: Secondary | ICD-10-CM | POA: Insufficient documentation

## 2015-03-03 DIAGNOSIS — K644 Residual hemorrhoidal skin tags: Secondary | ICD-10-CM

## 2015-03-03 LAB — COMPREHENSIVE METABOLIC PANEL
ALBUMIN: 4.1 g/dL (ref 3.5–5.2)
ALK PHOS: 42 U/L (ref 39–117)
ALT: 14 U/L (ref 0–35)
AST: 19 U/L (ref 0–37)
BILIRUBIN TOTAL: 0.6 mg/dL (ref 0.2–1.2)
BUN: 20 mg/dL (ref 6–23)
CO2: 30 mEq/L (ref 19–32)
Calcium: 9.7 mg/dL (ref 8.4–10.5)
Chloride: 103 mEq/L (ref 96–112)
Creatinine, Ser: 0.93 mg/dL (ref 0.40–1.20)
GFR: 62.15 mL/min (ref >60.00)
GLUCOSE: 90 mg/dL (ref 70–99)
POTASSIUM: 4.3 meq/L (ref 3.5–5.1)
SODIUM: 140 meq/L (ref 135–145)
TOTAL PROTEIN: 6.8 g/dL (ref 6.0–8.3)

## 2015-03-03 LAB — POC URINALSYSI DIPSTICK (AUTOMATED)
Bilirubin, UA: NEGATIVE
Blood, UA: NEGATIVE
Glucose, UA: NEGATIVE
Ketones, UA: NEGATIVE
LEUKOCYTES UA: NEGATIVE
NITRITE UA: NEGATIVE
Protein, UA: NEGATIVE
Spec Grav, UA: 1.015
UROBILINOGEN UA: 0.2
pH, UA: 7.5

## 2015-03-03 LAB — LIPID PANEL
Cholesterol: 199 mg/dL (ref 0–200)
HDL: 52.6 mg/dL (ref >39.00)
LDL Cholesterol: 130 mg/dL — ABNORMAL HIGH (ref 0–99)
NONHDL: 146.67
Total CHOL/HDL Ratio: 4
Triglycerides: 85 mg/dL (ref 0.0–149.0)
VLDL: 17 mg/dL (ref 0.0–40.0)

## 2015-03-03 LAB — VITAMIN D 25 HYDROXY (VIT D DEFICIENCY, FRACTURES): VITD: 39.67 ng/mL (ref 30.00–100.00)

## 2015-03-03 NOTE — Progress Notes (Signed)
Pre visit review using our clinic review tool, if applicable. No additional management support is needed unless otherwise documented below in the visit note. 

## 2015-03-03 NOTE — Progress Notes (Signed)
I have personally reviewed the Medicare Annual Wellness questionnaire and have noted 1. The patient's medical and social history 2. Their use of alcohol, tobacco or illicit drugs 3. Their current medications and supplements 4. The patient's functional ability including ADL's, fall risks, home safety risks and hearing or visual             impairment. 5. Diet and physical activities 6. Evidence for depression or mood disorders 7.         Updated provider list Cognitive evaluation was performed and recorded on pt medicare questionnaire form. The patients weight, height, BMI and visual acuity have been recorded in the chart  I have made referrals, counseling and provided education to the patient based review of the above and I have provided the pt with a written personalized care plan for preventive service   Occ dull back ache x 24 hours.  Had UA here today negative: no dysuria. No blood in urine.  She has had occ bloody discharge x 3 months on underwear, wears liner. UA clear. Not in stool.  Has seen Dr. Quincy Simmonds in past. Postmenopausal.   Elevated liver transaminases, mild: due for re-eval.  Hx of breast adenocarcinoma:  Has had [facility] exam at [facility]... released 10 years breast cancer free next year..  Mammogram  in 05/2014 .  Hypertension: Good control today on losartan/hctz.  She has an element of white coat HTN. BP Readings from Last 3 Encounters:  03/03/15 124/76  12/23/13 120/70  03/16/13 118/82  Chest pain with exertion: None  Edema:None  Short of breath:None  Average home BPs: 122/67-128/62, HR 64-73..  Walking 3-4 days a week. Diet:healthy. Low fat diet, fruits and veggies.   Elevated Cholesterol:Due for re-eval.  Lab Results  Component Value Date   CHOL 222* 12/23/2013   HDL 56.40 12/23/2013   LDLCALC 145* 12/23/2013   LDLDIRECT 148.1 11/17/2012   TRIG 103.0 12/23/2013   CHOLHDL 4 12/23/2013   Review of Systems  Constitutional: Negative for  fever and fatigue.  HENT: Positive for postnasal drip. Negative for ear pain, sneezing and neck pain.  Wears hearing aids, hx of stapendectomy  No chest [pain, NO SOB  Eyes: Negative for pain.  Gastrointestinal: . Negative for abdominal pain, diarrhea, constipation and abdominal distention.  Genitourinary: Negative for dysuria, hematuria and NEW vaginal bleeding.  Musculoskeletal: Negative for arthralgias.  Skin: Negative for rash.  Psychiatric/Behavioral: Negative for behavioral problems. The patient is not nervous/anxious.  Objective:   Physical Exam  Constitutional: Vital signs are normal. She appears well-developed and well-nourished. She is cooperative. Non-toxic appearance. She does not appear ill. No distress.  HENT:  Head: Normocephalic.  Right Ear: Tympanic membrane, external ear and ear canal normal. No drainage. Tympanic membrane is not injected, not scarred, not perforated, not erythematous, not retracted and not bulging. No middle ear effusion.  Left Ear: External ear and ear canal normal. No drainage. Tympanic membrane is not injected, not scarred, not perforated, not erythematous, not retracted and not bulging. Nose: Nose normal.  Mild scarring B TMs from past surgical procedure.  Eyes: Conjunctivae, EOM and lids are normal. Pupils are equal, round, and reactive to light. No foreign bodies found.  Neck: Trachea normal and normal range of motion. Neck supple. Carotid bruit is not present. No mass and no thyromegaly present.  Cardiovascular: Normal rate, regular rhythm, S1 normal, S2 normal, normal heart sounds and intact distal pulses. Exam reveals no gallop.  No murmur heard.  Pulmonary/Chest: Effort normal  and breath sounds normal. No respiratory distress. She has no wheezes. She has no rhonchi. She has no rales.  Abdominal: Soft. Normal appearance and bowel sounds are normal. She exhibits no distension, no fluid wave, no abdominal bruit and no mass. There  is no hepatosplenomegaly. There is no tenderness. There is no rebound, no guarding and no CVA tenderness. No hernia.  Genitourinary:  PAP and DVE will be performed by GYN for eval of post menopausal bleeding. Breast exam: Breast exam: Haled scar on right breast.  No mass, nodules, thickening, tenderness, bulging, retraction, inflamation, nipple discharge or skin changes noted.  No axillary or clavicular LA.  Chaperoned exam.  Lymphadenopathy:  She has no cervical adenopathy.  She has no axillary adenopathy.  Neurological: She is alert. She has normal strength. No cranial nerve deficit or sensory deficit.  Skin: Skin is warm, dry and intact. No rash noted.  Psychiatric: Her speech is normal and behavior is normal. Judgment normal. Her mood appears not anxious. Cognition and memory are normal. She does not exhibit a depressed mood.  Assessment & Plan:   Annual Medicare Wellness:  The patient's preventative maintenance and recommended screening tests for an annual wellness exam were reviewed in full today.  Brought up to date unless services declined.  Counselled on the importance of diet, exercise, and its role in overall health and mortality.  The patient's FH and SH was reviewed, including their home life, tobacco status, and drug and alcohol status.    DVE or PAP Will be per GYN. Vaccines: due for pneumonvax Colonoscopy: 06/2005, next due 06/2015  Mammo: 04/2014 hx of breast cancer DEXA: The patient's last bone density scan on 04/29/2011 showed a T score of -1.4 (osteopenia), not interested in fosamax. Repeat 04/2013: increase in spine and decrease in femur compared to 2013, but no change compared to earlier 2006, plan repeat in 2020. Non smoker.

## 2015-03-03 NOTE — Assessment & Plan Note (Signed)
Flonase has not helped. Can try antihistamine or mucinex plain.

## 2015-03-03 NOTE — Assessment & Plan Note (Signed)
Well controlled. Continue current medication.  

## 2015-03-03 NOTE — Assessment & Plan Note (Addendum)
Due for re-eval on no med. 

## 2015-03-03 NOTE — Assessment & Plan Note (Signed)
Pt will call reguarding cheaper med on formulary for hemorrhoids.

## 2015-03-03 NOTE — Assessment & Plan Note (Signed)
No blood in urine. Refer to GYN for eval. Pt does not wish to start work up here.

## 2015-03-03 NOTE — Assessment & Plan Note (Signed)
MSK strain, no sign of UTI on UA. Heat and gentle stretching.

## 2015-03-03 NOTE — Patient Instructions (Addendum)
Stop at lab on way out. Call Dr. Quincy Simmonds in next 1-2 weeks for postmenopausal bleeding. Can try zyrtec at bedtime or mucinex plain twice daily for post nasal drip. Colonoscopy to be done after  06/2015.  I will see you on Tuesday to review labs.

## 2015-03-07 NOTE — Addendum Note (Signed)
Addended by: Eliezer Lofts E on: 03/07/2015 10:54 AM   Modules accepted: Orders, SmartSet

## 2015-03-20 ENCOUNTER — Other Ambulatory Visit (INDEPENDENT_AMBULATORY_CARE_PROVIDER_SITE_OTHER): Payer: Medicare Other

## 2015-03-20 ENCOUNTER — Other Ambulatory Visit: Payer: Self-pay | Admitting: Family Medicine

## 2015-03-20 DIAGNOSIS — Z1211 Encounter for screening for malignant neoplasm of colon: Secondary | ICD-10-CM | POA: Diagnosis not present

## 2015-03-20 LAB — FECAL OCCULT BLOOD, IMMUNOCHEMICAL: Fecal Occult Bld: POSITIVE — AB

## 2015-03-28 ENCOUNTER — Other Ambulatory Visit: Payer: Self-pay

## 2015-03-28 DIAGNOSIS — Z1231 Encounter for screening mammogram for malignant neoplasm of breast: Secondary | ICD-10-CM

## 2015-05-04 ENCOUNTER — Other Ambulatory Visit: Payer: Self-pay | Admitting: Obstetrics and Gynecology

## 2015-05-04 ENCOUNTER — Ambulatory Visit
Admission: RE | Admit: 2015-05-04 | Discharge: 2015-05-04 | Disposition: A | Discharge status: Home / Self Care | Payer: Medicare Other | Source: Ambulatory Visit

## 2015-05-04 DIAGNOSIS — Z1231 Encounter for screening mammogram for malignant neoplasm of breast: Secondary | ICD-10-CM | POA: Diagnosis not present

## 2015-05-04 DIAGNOSIS — Z124 Encounter for screening for malignant neoplasm of cervix: Secondary | ICD-10-CM | POA: Diagnosis not present

## 2015-05-04 DIAGNOSIS — N760 Acute vaginitis: Secondary | ICD-10-CM | POA: Diagnosis not present

## 2015-05-04 DIAGNOSIS — Z01419 Encounter for gynecological examination (general) (routine) without abnormal findings: Secondary | ICD-10-CM | POA: Diagnosis not present

## 2015-05-05 LAB — CYTOLOGY - PAP

## 2015-05-09 ENCOUNTER — Other Ambulatory Visit: Payer: Self-pay | Admitting: Family Medicine

## 2015-05-09 DIAGNOSIS — R928 Other abnormal and inconclusive findings on diagnostic imaging of breast: Secondary | ICD-10-CM

## 2015-05-15 ENCOUNTER — Other Ambulatory Visit: Payer: Self-pay | Admitting: Family Medicine

## 2015-05-15 ENCOUNTER — Ambulatory Visit
Admission: RE | Admit: 2015-05-15 | Discharge: 2015-05-15 | Disposition: A | Discharge status: Home / Self Care | Payer: Medicare Other | Source: Ambulatory Visit | Attending: Family Medicine | Admitting: Family Medicine

## 2015-05-15 DIAGNOSIS — R928 Other abnormal and inconclusive findings on diagnostic imaging of breast: Secondary | ICD-10-CM

## 2015-05-15 DIAGNOSIS — N6489 Other specified disorders of breast: Secondary | ICD-10-CM | POA: Diagnosis not present

## 2015-05-16 ENCOUNTER — Other Ambulatory Visit: Payer: Self-pay | Admitting: Obstetrics and Gynecology

## 2015-05-16 DIAGNOSIS — C541 Malignant neoplasm of endometrium: Secondary | ICD-10-CM | POA: Diagnosis not present

## 2015-05-16 DIAGNOSIS — N950 Postmenopausal bleeding: Secondary | ICD-10-CM | POA: Diagnosis not present

## 2015-05-17 ENCOUNTER — Other Ambulatory Visit: Payer: Self-pay | Admitting: Family Medicine

## 2015-05-17 DIAGNOSIS — R928 Other abnormal and inconclusive findings on diagnostic imaging of breast: Secondary | ICD-10-CM

## 2015-05-18 ENCOUNTER — Ambulatory Visit
Admission: RE | Admit: 2015-05-18 | Discharge: 2015-05-18 | Disposition: A | Discharge status: Home / Self Care | Payer: Medicare Other | Source: Ambulatory Visit | Attending: Family Medicine | Admitting: Family Medicine

## 2015-05-18 DIAGNOSIS — R928 Other abnormal and inconclusive findings on diagnostic imaging of breast: Secondary | ICD-10-CM

## 2015-06-05 ENCOUNTER — Ambulatory Visit: Payer: Medicare Other | Attending: Gynecologic Oncology | Admitting: Gynecologic Oncology

## 2015-06-05 ENCOUNTER — Encounter: Payer: Self-pay | Admitting: Gynecologic Oncology

## 2015-06-05 VITALS — BP 157/82 | HR 88 | Temp 97.6°F | Resp 20 | Ht 61.0 in | Wt 128.6 lb

## 2015-06-05 DIAGNOSIS — Z853 Personal history of malignant neoplasm of breast: Secondary | ICD-10-CM | POA: Diagnosis not present

## 2015-06-05 DIAGNOSIS — M8580 Other specified disorders of bone density and structure, unspecified site: Secondary | ICD-10-CM | POA: Diagnosis not present

## 2015-06-05 DIAGNOSIS — C541 Malignant neoplasm of endometrium: Secondary | ICD-10-CM | POA: Diagnosis not present

## 2015-06-05 DIAGNOSIS — Z801 Family history of malignant neoplasm of trachea, bronchus and lung: Secondary | ICD-10-CM | POA: Insufficient documentation

## 2015-06-05 DIAGNOSIS — I10 Essential (primary) hypertension: Secondary | ICD-10-CM | POA: Insufficient documentation

## 2015-06-05 DIAGNOSIS — Z880 Allergy status to penicillin: Secondary | ICD-10-CM | POA: Insufficient documentation

## 2015-06-05 DIAGNOSIS — Z7982 Long term (current) use of aspirin: Secondary | ICD-10-CM | POA: Insufficient documentation

## 2015-06-05 NOTE — Progress Notes (Signed)
Consult Note: Gyn-Onc  Consult was requested by Dr. Philis Pique for the evaluation of [name] 75 y.o. female  CC:  Chief Complaint  Patient presents with  . New Consultation    Endometrial cancer    Assessment/Plan:  [name]  is a 75 y.o.  year old with grade 1 endometrial cancer.    A detailed discussion was held with the patient and her family with regard to to her endometrial cancer diagnosis. We discussed the standard management options for uterine cancer which includes surgery followed possibly by adjuvant therapy depending on the results of surgery. The options for surgical management include a hysterectomy and removal of the tubes and ovaries possibly with removal of pelvic and para-aortic lymph nodes. A minimally invasive approach including a robotic hysterectomy or laparoscopic hysterectomy have benefits including shorter hospital stay, recovery time and better wound healing. The alternative approach is an open hysterectomy. The patient has been counseled about these surgical options and the risks of surgery in general including infection, bleeding, damage to surrounding structures (including bowel, bladder, ureters, nerves or vessels), and the postoperative risks of PE/ DVT, and lymphedema. I extensively reviewed the additional risks of robotic hysterectomy including possible need for conversion to open laparotomy.  I discussed positioning during surgery of trendelenberg and risks of minor facial swelling and care we take in preoperative positioning.  After counseling and consideration of her options, she desires to proceed with robotic assisted total hysterectomy, BSO, sentinel lymph node biopsy.   She will be seen by anesthesia for preoperative clearance and discussion of postoperative pain management.  She was given the opportunity to ask questions, which were answered to her satisfaction, and she is agreement with the above mentioned plan of care.   HPI: The patient  is a 75 year old [id number] who is seen in consultation at the request of Dr Philis Pique for grade 1 endometrial cancer.  Patient began bleeding in January 2017. It is occasional spotting. She was seen by Dr. Philis Pique who performed a transvaginal ultrasound scan on 05/16/2015 which demonstrated a uterus measuring 4.6 x 2.5 x 8.2 cm. The endometrial stripe was thickened. A Pap smear was obtained that was unremarkable and an endometrial Pipelle biopsy was performed which performed a FIGO grade 1 endometrioid adenocarcinoma. This biopsy was performed on 05/16/2015.  The patient is otherwise fairly healthy. She has some hypertension. She takes 81 mg of aspirin for prophylaxis. She's had no prior abdominal surgeries. She is thin. She has no significant family history for malignancy. Her brothers died of lung cancer and esophageal cancer but were both heavy smokers and drinkers. She does not otherwise have a history suggestive for Lynch syndrome.  Current Meds:  Outpatient Encounter Prescriptions as of 06/05/2015  Medication Sig  . aspirin 81 MG tablet Take 81 mg by mouth daily.    . Cholecalciferol 1000 UNITS capsule Take 1,000 Units by mouth daily.  Marland Kitchen losartan-hydrochlorothiazide (HYZAAR) 50-12.5 MG tablet TAKE 1 TABLET BY MOUTH DAILY  . Nutritional Supplements (PYCNOGENOL) 30 MG CAPS Take by mouth daily.  . Red Yeast Rice Extract (RED YEAST RICE PO) Take 1,200 mg by mouth daily.  . sodium fluoride-calcium carbonate (FLORICAL) 8.3-364 MG CAPS Take 1 capsule by mouth 2 (two) times daily.   No facility-administered encounter medications on file as of 06/05/2015.    Allergy:  Allergies  Allergen Reactions  . Penicillins     REACTION: Rash    Social Hx:   Social History  Social History  . Marital Status: Married    Spouse Name: N/A  . Number of Children: N/A  . Years of Education: N/A   Occupational History  . retired    Social History Main Topics  . Smoking status: Never Smoker   . Smokeless  tobacco: Never Used  . Alcohol Use: No  . Drug Use: No  . Sexual Activity: Yes    Birth Control/ Protection: Post-menopausal   Other Topics Concern  . Not on file   Social History Narrative   Regular exercise--no, occ goes to [employer]   Diet: healthy, fruits and veggies   No living will, no HCPOA (reviewed 2013)    Past Surgical Hx:  Past Surgical History  Procedure Laterality Date  . Breast surgery  2005    LUMPECTOMY  . Stapendectomy      BILATERAL  . Episiotomy    . Hysteroscopy w/d&c    . Proctoscopy    . Proctosomy repair      Past Medical Hx:  Past Medical History  Diagnosis Date  . Hypertension   . Breast cancer ([place]) 04/2003    Invasive ductal carcinoma of the right breast  . Osteopenia     Past Gynecological History:  SVD x 1  No LMP recorded. Patient is postmenopausal.  Family Hx:  Family History  Problem Relation Age of Onset  . Stroke Father   . Hypertension Mother   . Stroke Sister   . Cancer Brother     Esophageal cancer  . Cancer Brother     Lung smoker  . Parkinson's disease Brother   . Stroke Brother     Review of Systems:  Constitutional  Feels well,    ENT Normal appearing ears and nares bilaterally Skin/Breast  No rash, sores, jaundice, itching, dryness Cardiovascular  No chest pain, shortness of breath, or edema  Pulmonary  No cough or wheeze.  Gastro Intestinal  No nausea, vomitting, or diarrhoea. No bright red blood per rectum, no abdominal pain, change in bowel movement, or constipation.  Genito Urinary  No frequency, urgency, dysuria, + postmenopausal bleeding Musculo Skeletal  No myalgia, arthralgia, joint swelling or pain  Neurologic  No weakness, numbness, change in gait,  Psychology  No depression, anxiety, insomnia.   Vitals:  Blood pressure 157/82, pulse 88, temperature 97.6 F (36.4 C), temperature source Oral, resp. rate 20, height 5\' 1"  (1.549 m), weight 128 lb 9.6 oz (58.333 kg), SpO2 98 %.  Physical  Exam: WD in NAD Neck  Supple NROM, without any enlargements.  Lymph Node Survey No cervical supraclavicular or inguinal adenopathy Cardiovascular  Pulse normal rate, regularity and rhythm. S1 and S2 normal.  Lungs  Clear to auscultation bilateraly, without wheezes/crackles/rhonchi. Good air movement.  Skin  No rash/lesions/breakdown  Psychiatry  Alert and oriented to person, place, and time  Abdomen  Normoactive bowel sounds, abdomen soft, non-tender and thin without evidence of hernia.  Back No CVA tenderness Genito Urinary  Vulva/vagina: Normal external female genitalia.  No lesions. No discharge or bleeding.  Bladder/urethra:  No lesions or masses, well supported bladder  Vagina: normal  Cervix: Normal appearing, no lesions. Flush with vagina.  Uterus:  Small, mobile, no parametrial involvement or nodularity.  Adnexa: no masses. Rectal  Good tone, no masses no cul de sac nodularity.  Extremities  No bilateral cyanosis, clubbing or edema.   Donaciano Eva, MD  06/05/2015, 11:43 AM

## 2015-06-05 NOTE — Patient Instructions (Signed)
Preparing for your Surgery  Plan for surgery on May 18 with Dr. Everitt Amber.  You will be scheduled for a robotic assisted total hysterectomy, bilateral salpingo-oophorectomy, sentinel lymph node biopsy.    Pre-operative Testing -You will receive a phone call from presurgical testing at [facility] to arrange for a pre-operative testing appointment before your surgery.  This appointment normally occurs one to two weeks before your scheduled surgery.   -Bring your insurance card, copy of an advanced directive if applicable, medication list  -At that visit, you will be asked to sign a consent for a possible blood transfusion in case a transfusion becomes necessary during surgery.  The need for a blood transfusion is rare but having consent is a necessary part of your care.     -STOP ASPIRIN NOW.  Day Before Surgery at [place] will be asked to take in a light diet the day before surgery.  Avoid carbonated beverages.  You will be advised to have nothing to eat or drink after midnight the evening before.     Eat a light diet the day before surgery.  Examples including soups, broths, toast, yogurt, mashed potatoes.  Things to avoid include carbonated beverages (fizzy beverages), raw fruits and raw vegetables, or beans.    If your bowels are filled with gas, your surgeon will have difficulty visualizing your pelvic organs which increases your surgical risks.  Your role in recovery Your role is to become active as soon as directed by your doctor, while still giving yourself time to heal.  Rest when you feel tired. You will be asked to do the following in order to speed your recovery:  - Cough and breathe deeply. This helps toclear and expand your lungs and can prevent pneumonia. You may be given a spirometer to practice deep breathing. A staff member will show you how to use the spirometer. - Do mild physical activity. Walking or moving your legs help your circulation and body  functions return to normal. A staff member will help you when you try to walk and will provide you with simple exercises. Do not try to get up or walk alone the first time. - Actively manage your pain. Managing your pain lets you move in comfort. We will ask you to rate your pain on a scale of zero to 10. It is your responsibility to tell your doctor or nurse where and how much you hurt so your pain can be treated.  Special Considerations -If you are diabetic, you may be placed on insulin after surgery to have closer control over your blood sugars to promote healing and recovery.  This does not mean that you will be discharged on insulin.  If applicable, your oral antidiabetics will be resumed when you are tolerating a solid diet.  -Your final pathology results from surgery should be available by the Friday after surgery and the results will be relayed to you when available.  Blood Transfusion Information WHAT IS A BLOOD TRANSFUSION? A transfusion is the replacement of blood or some of its parts. Blood is made up of multiple cells which provide different functions.  Red blood cells carry oxygen and are used for blood loss replacement.  White blood cells fight against infection.  Platelets control bleeding.  Plasma helps clot blood.  Other blood products are available for specialized needs, such as hemophilia or other clotting disorders. BEFORE THE TRANSFUSION  Who gives blood for transfusions?   You may be able to donate blood  to be used at a later date on yourself (autologous donation).  Relatives can be asked to donate blood. This is generally not any safer than if you have received blood from a stranger. The same precautions are taken to ensure safety when a relative's blood is donated.  Healthy volunteers who are fully evaluated to make sure their blood is safe. This is blood bank blood. Transfusion therapy is the safest it has ever been in the practice of medicine. Before blood is  taken from a donor, a complete history is taken to make sure that person has no history of diseases nor engages in risky social behavior (examples are intravenous drug use or sexual activity with multiple partners). The donor's travel history is screened to minimize risk of transmitting infections, such as malaria. The donated blood is tested for signs of infectious diseases, such as HIV and hepatitis. The blood is then tested to be sure it is compatible with you in order to minimize the chance of a transfusion reaction. If you or a relative donates blood, this is often done in anticipation of surgery and is not appropriate for emergency situations. It takes many days to process the donated blood. RISKS AND COMPLICATIONS Although transfusion therapy is very safe and saves many lives, the main dangers of transfusion include:   Getting an infectious disease.  Developing a transfusion reaction. This is an allergic reaction to something in the blood you were given. Every precaution is taken to prevent this. The decision to have a blood transfusion has been considered carefully by your caregiver before blood is given. Blood is not given unless the benefits outweigh the risks.

## 2015-06-08 ENCOUNTER — Telehealth: Payer: Self-pay | Admitting: Gynecologic Oncology

## 2015-06-08 NOTE — Telephone Encounter (Signed)
Called patient to see if she would like to move her surgery up to May 9 with Dr. Skeet Latch since at her last office visit, she voiced wanting her surgery sooner than the scheduled date of May 18.  Patient discussed with her husband and stated she would like to keep her surgery as scheduled on May 18.  Advised she could call the office back today if she changes her mind or for any questions/concerns.

## 2015-06-08 NOTE — Patient Instructions (Addendum)
[name]  06/08/2015   Your procedure is scheduled on: 06/13/15 TUESDAY  Report to [facility] Main  Entrance take [facility]  elevators to 3rd floor to  [place] at [place]  AM.  Call this number if you have problems the morning of surgery [phone]   Remember: ONLY 1 PERSON MAY GO WITH YOU TO SHORT STAY TO GET  READY MORNING OF [place].  Do not eat food or drink liquids :After Midnight.MONDAY NIGHT SEE FOLLOWING INSTRUCTIONS REGARDING DIET FOR Monday UNTIL MIDNIGHT     Take these medicines the morning of surgery with A SIP OF WATER: NONE DO NOT TAKE ANY DIABETIC MEDICATIONS DAY OF YOUR SURGERY                               You may not have any metal on your body including hair pins and              piercings  Do not wear jewelry, make-up, lotions, powders or perfumes, deodorant             Do not wear nail polish.  Do not shave  48 hours prior to surgery.              Men may shave face and neck.   Do not bring valuables to the hospital. [place].  Contacts, dentures or bridgework may not be worn into surgery.  Leave suitcase in the car. After surgery it may be brought to your room.                 Please read over the following fact sheets you were given: _____________________________________________________________________             [facility] - Preparing for Surgery Before surgery, you can play an important role.  Because skin is not sterile, your skin needs to be as free of germs as possible.  You can reduce the number of germs on your skin by washing with CHG (chlorahexidine gluconate) soap before surgery.  CHG is an antiseptic cleaner which kills germs and bonds with the skin to continue killing germs even after washing. Please DO NOT use if you have an allergy to CHG or antibacterial soaps.  If your skin becomes reddened/irritated stop using the CHG and inform your nurse when you  arrive at Short Stay. Do not shave (including legs and underarms) for at least 48 hours prior to the first CHG shower.  You may shave your face/neck. Please follow these instructions carefully:  1.  Shower with CHG Soap the night before surgery and the  morning of Surgery.  2.  If you choose to wash your hair, wash your hair first as usual with your  normal  shampoo.  3.  After you shampoo, rinse your hair and body thoroughly to remove the  shampoo.                           4.  Use CHG as you would any other liquid soap.  You can apply chg directly  to the skin and wash  Gently with a scrungie or clean washcloth.  5.  Apply the CHG Soap to your body ONLY FROM THE NECK DOWN.   Do not use on face/ open                           Wound or open sores. Avoid contact with eyes, ears mouth and genitals (private parts).                       Wash face,  Genitals (private parts) with your normal soap.             6.  Wash thoroughly, paying special attention to the area where your surgery  will be performed.  7.  Thoroughly rinse your body with warm water from the neck down.  8.  DO NOT shower/wash with your normal soap after using and rinsing off  the CHG Soap.                9.  Pat yourself dry with a clean towel.            10.  Wear clean pajamas.            11.  Place clean sheets on your bed the night of your first shower and do not  sleep with pets. Day of Surgery : Do not apply any lotions/deodorants the morning of surgery.  Please wear clean clothes to the hospital/surgery center.  FAILURE TO FOLLOW THESE INSTRUCTIONS MAY RESULT IN THE CANCELLATION OF YOUR SURGERY PATIENT SIGNATURE_________________________________  NURSE SIGNATURE__________________________________  ________________________________________________________________________  ________________________________________________________________________  WHAT IS A BLOOD TRANSFUSION? Blood Transfusion  Information  A transfusion is the replacement of blood or some of its parts. Blood is made up of multiple cells which provide different functions.  Red blood cells carry oxygen and are used for blood loss replacement.  White blood cells fight against infection.  Platelets control bleeding.  Plasma helps clot blood.  Other blood products are available for specialized needs, such as hemophilia or other clotting disorders. BEFORE THE TRANSFUSION  Who gives blood for transfusions?   Healthy volunteers who are fully evaluated to make sure their blood is safe. This is blood bank blood. Transfusion therapy is the safest it has ever been in the practice of medicine. Before blood is taken from a donor, a complete history is taken to make sure that person has no history of diseases nor engages in risky social behavior (examples are intravenous drug use or sexual activity with multiple partners). The donor's travel history is screened to minimize risk of transmitting infections, such as malaria. The donated blood is tested for signs of infectious diseases, such as HIV and hepatitis. The blood is then tested to be sure it is compatible with you in order to minimize the chance of a transfusion reaction. If you or a relative donates blood, this is often done in anticipation of surgery and is not appropriate for emergency situations. It takes many days to process the donated blood. RISKS AND COMPLICATIONS Although transfusion therapy is very safe and saves many lives, the main dangers of transfusion include:  1. Getting an infectious disease. 2. Developing a transfusion reaction. This is an allergic reaction to something in the blood you were given. Every precaution is taken to prevent this. The decision to have a blood transfusion has been considered carefully by your caregiver before blood is given. Blood is not given unless the  benefits outweigh the risks. AFTER THE TRANSFUSION  Right after receiving a  blood transfusion, you will usually feel much better and more energetic. This is especially true if your red blood cells have gotten low (anemic). The transfusion raises the level of the red blood cells which carry oxygen, and this usually causes an energy increase.  The nurse administering the transfusion will monitor you carefully for complications. HOME CARE INSTRUCTIONS  No special instructions are needed after a transfusion. You may find your energy is better. Speak with your caregiver about any limitations on activity for underlying diseases you may have. SEEK MEDICAL CARE IF:   Your condition is not improving after your transfusion.  You develop redness or irritation at the intravenous (IV) site. SEEK IMMEDIATE MEDICAL CARE IF:  Any of the following symptoms occur over the next 12 hours:  Shaking chills.  You have a temperature by mouth above 102 F (38.9 C), not controlled by medicine.  Chest, back, or muscle pain.  People around you feel you are not acting correctly or are confused.  Shortness of breath or difficulty breathing.  Dizziness and fainting.  You get a rash or develop hives.  You have a decrease in urine output.  Your urine turns a dark color or changes to pink, red, or brown. Any of the following symptoms occur over the next 10 days:  You have a temperature by mouth above 102 F (38.9 C), not controlled by medicine.  Shortness of breath.  Weakness after normal activity.  The white part of the eye turns yellow (jaundice).  You have a decrease in the amount of urine or are urinating less often.  Your urine turns a dark color or changes to pink, red, or brown. Document Released: 01/19/2000 Document Revised: 04/15/2011 Document Reviewed: 09/07/2007 Exit[employer] Patient Information 2014 [employer], [place].  _______________________________________________________________________Eat a light diet the day before surgery.  Examples including soups, broths, toast,  yogurt, mashed potatoes.  Things to avoid include carbonated beverages (fizzy beverages), raw fruits and raw vegetables, or beans.   If your bowels are filled with gas, your surgeon will have difficulty visualizing your pelvic organs which increases your surgical risks.Eat a light diet the day before surgery.  Examples including soups, broths, toast, yogurt, mashed potatoes.  Things to avoid include carbonated beverages (fizzy beverages), raw fruits and raw vegetables, or beans.   If your bowels are filled with gas, your surgeon will have difficulty visualizing your pelvic organs which increases your surgical risks.  Incentive Spirometer  An incentive spirometer is a tool that can help keep your lungs clear and active. This tool measures how well you are filling your lungs with each breath. Taking long deep breaths may help reverse or decrease the chance of developing breathing (pulmonary) problems (especially infection) following:  A long period of time when you are unable to move or be active. BEFORE THE PROCEDURE   If the spirometer includes an indicator to show your best effort, your nurse or respiratory therapist will set it to a desired goal.  If possible, sit up straight or lean slightly forward. Try not to slouch.  Hold the incentive spirometer in an upright position. INSTRUCTIONS FOR USE  3. Sit on the edge of your bed if possible, or sit up as far as you can in bed or on a chair. 4. Hold the incentive spirometer in an upright position. 5. Breathe out normally. 6. Place the mouthpiece in your mouth and seal your lips tightly around it. 7.  Breathe in slowly and as deeply as possible, raising the piston or the ball toward the top of the column. 8. Hold your breath for 3-5 seconds or for as long as possible. Allow the piston or ball to fall to the bottom of the column. 9. Remove the mouthpiece from your mouth and breathe out normally. 10. Rest for a few seconds and repeat Steps 1  through 7 at least 10 times every 1-2 hours when you are awake. Take your time and take a few normal breaths between deep breaths. 11. The spirometer may include an indicator to show your best effort. Use the indicator as a goal to work toward during each repetition. 12. After each set of 10 deep breaths, practice coughing to be sure your lungs are clear. If you have an incision (the cut made at the time of surgery), support your incision when coughing by placing a pillow or rolled up towels firmly against it. Once you are able to get out of bed, walk around indoors and cough well. You may stop using the incentive spirometer when instructed by your caregiver.  RISKS AND COMPLICATIONS  Take your time so you do not get dizzy or light-headed.  If you are in pain, you may need to take or ask for pain medication before doing incentive spirometry. It is harder to take a deep breath if you are having pain. AFTER USE  Rest and breathe slowly and easily.  It can be helpful to keep track of a log of your progress. Your caregiver can provide you with a simple table to help with this. If you are using the spirometer at home, follow these instructions: [place] IF:   You are having difficultly using the spirometer.  You have trouble using the spirometer as often as instructed.  Your pain medication is not giving enough relief while using the spirometer.  You develop fever of 100.5 F (38.1 C) or higher. SEEK IMMEDIATE MEDICAL CARE IF:   You cough up bloody sputum that had not been present before.  You develop fever of 102 F (38.9 C) or greater.  You develop worsening pain at or near the incision site. MAKE SURE YOU:   Understand these instructions.  Will watch your condition.  Will get help right away if you are not doing well or get worse. Document Released: 06/03/2006 Document Revised: 04/15/2011 Document Reviewed: 08/04/2006 [facility] Patient Information 2014 [place],  [place].   ________________________________________________________________________

## 2015-06-09 ENCOUNTER — Encounter (HOSPITAL_COMMUNITY): Payer: Self-pay

## 2015-06-09 ENCOUNTER — Encounter (HOSPITAL_COMMUNITY)
Admission: RE | Admit: 2015-06-09 | Discharge: 2015-06-09 | Disposition: A | Discharge status: Home / Self Care | Payer: Medicare Other | Source: Ambulatory Visit | Attending: Obstetrics & Gynecology | Admitting: Obstetrics & Gynecology

## 2015-06-09 ENCOUNTER — Ambulatory Visit (HOSPITAL_COMMUNITY)
Admission: RE | Admit: 2015-06-09 | Discharge: 2015-06-09 | Disposition: A | Discharge status: Home / Self Care | Payer: Medicare Other | Source: Ambulatory Visit | Attending: Gynecologic Oncology | Admitting: Gynecologic Oncology

## 2015-06-09 DIAGNOSIS — Z01818 Encounter for other preprocedural examination: Secondary | ICD-10-CM | POA: Insufficient documentation

## 2015-06-09 DIAGNOSIS — C541 Malignant neoplasm of endometrium: Secondary | ICD-10-CM

## 2015-06-09 DIAGNOSIS — I10 Essential (primary) hypertension: Secondary | ICD-10-CM | POA: Insufficient documentation

## 2015-06-09 HISTORY — DX: Adverse effect of unspecified anesthetic, initial encounter: T41.45XA

## 2015-06-09 HISTORY — DX: Unspecified hearing loss, unspecified ear: H91.90

## 2015-06-09 HISTORY — DX: Other complications of anesthesia, initial encounter: T88.59XA

## 2015-06-09 LAB — URINALYSIS, ROUTINE W REFLEX MICROSCOPIC
BILIRUBIN URINE: NEGATIVE
Glucose, UA: NEGATIVE mg/dL
Ketones, ur: NEGATIVE mg/dL
NITRITE: NEGATIVE
Protein, ur: NEGATIVE mg/dL
SPECIFIC GRAVITY, URINE: 1.009 (ref 1.005–1.030)
pH: 7.5 (ref 5.0–8.0)

## 2015-06-09 LAB — URINE MICROSCOPIC-ADD ON

## 2015-06-09 LAB — ABO/RH: ABO/RH(D): O POS

## 2015-06-09 LAB — CBC WITH DIFFERENTIAL/PLATELET
Basophils Absolute: 0 10*3/uL (ref 0.0–0.1)
Basophils Relative: 0 %
EOS PCT: 5 %
Eosinophils Absolute: 0.4 10*3/uL (ref 0.0–0.7)
HCT: 44.6 % (ref 36.0–46.0)
Hemoglobin: 15 g/dL (ref 12.0–15.0)
LYMPHS ABS: 1.9 10*3/uL (ref 0.7–4.0)
LYMPHS PCT: 23 %
MCH: 28.3 pg (ref 26.0–34.0)
MCHC: 33.6 g/dL (ref 30.0–36.0)
MCV: 84.2 fL (ref 78.0–100.0)
MONO ABS: 0.7 10*3/uL (ref 0.1–1.0)
Monocytes Relative: 8 %
Neutro Abs: 5.5 10*3/uL (ref 1.7–7.7)
Neutrophils Relative %: 64 %
PLATELETS: 212 10*3/uL (ref 150–400)
RBC: 5.3 MIL/uL — ABNORMAL HIGH (ref 3.87–5.11)
RDW: 13.2 % (ref 11.5–15.5)
WBC: 8.5 10*3/uL (ref 4.0–10.5)

## 2015-06-09 LAB — COMPREHENSIVE METABOLIC PANEL
ALT: 20 U/L (ref 14–54)
ANION GAP: 11 (ref 5–15)
AST: 24 U/L (ref 15–41)
Albumin: 4.4 g/dL (ref 3.5–5.0)
Alkaline Phosphatase: 46 U/L (ref 38–126)
BUN: 22 mg/dL — ABNORMAL HIGH (ref 6–20)
CHLORIDE: 105 mmol/L (ref 101–111)
CO2: 28 mmol/L (ref 22–32)
Calcium: 10.2 mg/dL (ref 8.9–10.3)
Creatinine, Ser: 0.92 mg/dL (ref 0.44–1.00)
GFR, EST NON AFRICAN AMERICAN: 59 mL/min — AB (ref >60)
Glucose, Bld: 93 mg/dL (ref 65–99)
POTASSIUM: 4.9 mmol/L (ref 3.5–5.1)
Sodium: 144 mmol/L (ref 135–145)
Total Bilirubin: 1.2 mg/dL (ref 0.3–1.2)
Total Protein: 7.2 g/dL (ref 6.5–8.1)

## 2015-06-09 NOTE — Progress Notes (Signed)
Faxed BUN to [employer] via epic

## 2015-06-09 NOTE — Progress Notes (Signed)
Faxed u/a with micro to [employer] via EPIC

## 2015-06-13 ENCOUNTER — Encounter (HOSPITAL_COMMUNITY)
Admission: RE | Disposition: A | Discharge status: Home / Self Care | Payer: Self-pay | Source: Ambulatory Visit | Attending: Obstetrics & Gynecology

## 2015-06-13 ENCOUNTER — Inpatient Hospital Stay (HOSPITAL_COMMUNITY): Payer: Medicare Other | Admitting: Anesthesiology

## 2015-06-13 ENCOUNTER — Inpatient Hospital Stay (HOSPITAL_COMMUNITY)
Admission: RE | Admit: 2015-06-13 | Discharge: 2015-06-14 | DRG: 741 | Disposition: A | Discharge status: Home / Self Care | Payer: Medicare Other | Source: Ambulatory Visit | Attending: Obstetrics & Gynecology | Admitting: Obstetrics & Gynecology

## 2015-06-13 ENCOUNTER — Encounter (HOSPITAL_COMMUNITY): Payer: Self-pay | Admitting: Anesthesiology

## 2015-06-13 DIAGNOSIS — Z801 Family history of malignant neoplasm of trachea, bronchus and lung: Secondary | ICD-10-CM

## 2015-06-13 DIAGNOSIS — Z823 Family history of stroke: Secondary | ICD-10-CM | POA: Diagnosis not present

## 2015-06-13 DIAGNOSIS — E7800 Pure hypercholesterolemia, unspecified: Secondary | ICD-10-CM | POA: Diagnosis present

## 2015-06-13 DIAGNOSIS — I10 Essential (primary) hypertension: Secondary | ICD-10-CM | POA: Diagnosis not present

## 2015-06-13 DIAGNOSIS — Z800 Family history of malignant neoplasm of digestive organs: Secondary | ICD-10-CM | POA: Diagnosis not present

## 2015-06-13 DIAGNOSIS — Z8542 Personal history of malignant neoplasm of other parts of uterus: Secondary | ICD-10-CM

## 2015-06-13 DIAGNOSIS — Z8249 Family history of ischemic heart disease and other diseases of the circulatory system: Secondary | ICD-10-CM

## 2015-06-13 DIAGNOSIS — M8580 Other specified disorders of bone density and structure, unspecified site: Secondary | ICD-10-CM | POA: Diagnosis not present

## 2015-06-13 DIAGNOSIS — Z7982 Long term (current) use of aspirin: Secondary | ICD-10-CM | POA: Diagnosis not present

## 2015-06-13 DIAGNOSIS — Z853 Personal history of malignant neoplasm of breast: Secondary | ICD-10-CM

## 2015-06-13 DIAGNOSIS — C541 Malignant neoplasm of endometrium: Secondary | ICD-10-CM | POA: Diagnosis not present

## 2015-06-13 HISTORY — PX: SALPINGOOPHORECTOMY: SHX82

## 2015-06-13 HISTORY — PX: LYMPH NODE BIOPSY: SHX201

## 2015-06-13 HISTORY — PX: ROBOTIC ASSISTED TOTAL HYSTERECTOMY: SHX6085

## 2015-06-13 LAB — TYPE AND SCREEN
ABO/RH(D): O POS
Antibody Screen: NEGATIVE

## 2015-06-13 SURGERY — HYSTERECTOMY, TOTAL, ROBOT-ASSISTED
Anesthesia: General

## 2015-06-13 MED ORDER — ACETAMINOPHEN 500 MG PO TABS: 1000.0000 mg | ORAL_TABLET | Freq: Two times a day (BID) | ORAL | Status: DC

## 2015-06-13 MED ORDER — ACETAMINOPHEN 500 MG PO TABS
1000.0000 mg | ORAL_TABLET | Freq: Two times a day (BID) | ORAL | Status: DC
  Administered 2015-06-13 – 2015-06-14 (×2): 1000 mg via ORAL
  Filled 2015-06-13 (×3): qty 2

## 2015-06-13 MED ORDER — GABAPENTIN 600 MG PO TABS
300.0000 mg | ORAL_TABLET | Freq: Every day | ORAL | Status: DC
  Filled 2015-06-13: qty 0.5

## 2015-06-13 MED ORDER — FENTANYL CITRATE (PF) 100 MCG/2ML IJ SOLN
INTRAMUSCULAR | Status: AC
  Filled 2015-06-13: qty 2

## 2015-06-13 MED ORDER — CLINDAMYCIN PHOSPHATE 900 MG/50ML IV SOLN
INTRAVENOUS | Status: AC
  Filled 2015-06-13: qty 50

## 2015-06-13 MED ORDER — KCL IN DEXTROSE-NACL 20-5-0.45 MEQ/L-%-% IV SOLN
INTRAVENOUS | Status: DC
  Administered 2015-06-13: 18:00:00 via INTRAVENOUS
  Filled 2015-06-13 (×2): qty 1000

## 2015-06-13 MED ORDER — LACTATED RINGERS IV SOLN: INTRAVENOUS | Status: DC

## 2015-06-13 MED ORDER — ENOXAPARIN SODIUM 40 MG/0.4ML ~~LOC~~ SOLN
40.0000 mg | SUBCUTANEOUS | Status: DC
  Administered 2015-06-14: 40 mg via SUBCUTANEOUS
  Filled 2015-06-13 (×2): qty 0.4

## 2015-06-13 MED ORDER — TRAMADOL HCL 50 MG PO TABS: 50.0000 mg | ORAL_TABLET | Freq: Four times a day (QID) | ORAL | Status: DC | PRN

## 2015-06-13 MED ORDER — PROPOFOL 10 MG/ML IV BOLUS
INTRAVENOUS | Status: AC
  Filled 2015-06-13: qty 20

## 2015-06-13 MED ORDER — FENTANYL CITRATE (PF) 100 MCG/2ML IJ SOLN
INTRAMUSCULAR | Status: DC | PRN
  Administered 2015-06-13 (×2): 50 ug via INTRAVENOUS
  Administered 2015-06-13: 100 ug via INTRAVENOUS

## 2015-06-13 MED ORDER — LOSARTAN POTASSIUM-HCTZ 50-12.5 MG PO TABS: 1.0000 | ORAL_TABLET | Freq: Every day | ORAL | Status: DC

## 2015-06-13 MED ORDER — MEPERIDINE HCL 50 MG/ML IJ SOLN
INTRAMUSCULAR | Status: AC
  Filled 2015-06-13: qty 1

## 2015-06-13 MED ORDER — GABAPENTIN 300 MG PO CAPS
300.0000 mg | ORAL_CAPSULE | Freq: Every day | ORAL | Status: DC
  Administered 2015-06-13: 300 mg via ORAL
  Filled 2015-06-13 (×2): qty 1

## 2015-06-13 MED ORDER — ROCURONIUM BROMIDE 100 MG/10ML IV SOLN
INTRAVENOUS | Status: DC | PRN
  Administered 2015-06-13: 40 mg via INTRAVENOUS
  Administered 2015-06-13: 10 mg via INTRAVENOUS

## 2015-06-13 MED ORDER — LACTATED RINGERS IR SOLN
Status: DC | PRN
  Administered 2015-06-13: 1

## 2015-06-13 MED ORDER — SUGAMMADEX SODIUM 200 MG/2ML IV SOLN
INTRAVENOUS | Status: DC | PRN
  Administered 2015-06-13: 200 mg via INTRAVENOUS

## 2015-06-13 MED ORDER — CIPROFLOXACIN IN D5W 400 MG/200ML IV SOLN
INTRAVENOUS | Status: AC
  Filled 2015-06-13: qty 200

## 2015-06-13 MED ORDER — ONDANSETRON HCL 4 MG/2ML IJ SOLN
4.0000 mg | Freq: Four times a day (QID) | INTRAMUSCULAR | Status: DC | PRN
  Administered 2015-06-13: 4 mg via INTRAVENOUS
  Filled 2015-06-13: qty 2

## 2015-06-13 MED ORDER — PROPOFOL 10 MG/ML IV BOLUS
INTRAVENOUS | Status: DC | PRN
  Administered 2015-06-13: 120 mg via INTRAVENOUS

## 2015-06-13 MED ORDER — IBUPROFEN 800 MG PO TABS
800.0000 mg | ORAL_TABLET | Freq: Three times a day (TID) | ORAL | Status: DC | PRN
  Administered 2015-06-14: 800 mg via ORAL
  Filled 2015-06-13: qty 1

## 2015-06-13 MED ORDER — HYDROMORPHONE HCL 1 MG/ML IJ SOLN
0.5000 mg | INTRAMUSCULAR | Status: DC | PRN
  Administered 2015-06-13 (×2): 0.25 mg via INTRAVENOUS

## 2015-06-13 MED ORDER — SUGAMMADEX SODIUM 200 MG/2ML IV SOLN
INTRAVENOUS | Status: AC
  Filled 2015-06-13: qty 2

## 2015-06-13 MED ORDER — ONDANSETRON HCL 4 MG/2ML IJ SOLN
INTRAMUSCULAR | Status: DC | PRN
  Administered 2015-06-13: 4 mg via INTRAVENOUS

## 2015-06-13 MED ORDER — ONDANSETRON HCL 4 MG/2ML IJ SOLN: 4.0000 mg | Freq: Once | INTRAMUSCULAR | Status: DC | PRN

## 2015-06-13 MED ORDER — ONDANSETRON HCL 4 MG PO TABS: 4.0000 mg | ORAL_TABLET | Freq: Four times a day (QID) | ORAL | Status: DC | PRN

## 2015-06-13 MED ORDER — HYDROCHLOROTHIAZIDE 12.5 MG PO CAPS
12.5000 mg | ORAL_CAPSULE | Freq: Every day | ORAL | Status: DC
  Administered 2015-06-14: 12.5 mg via ORAL
  Filled 2015-06-13: qty 1

## 2015-06-13 MED ORDER — ONDANSETRON HCL 4 MG/2ML IJ SOLN
INTRAMUSCULAR | Status: AC
  Filled 2015-06-13: qty 2

## 2015-06-13 MED ORDER — HYDROMORPHONE HCL 1 MG/ML IJ SOLN
INTRAMUSCULAR | Status: AC
  Filled 2015-06-13: qty 1

## 2015-06-13 MED ORDER — CIPROFLOXACIN IN D5W 400 MG/200ML IV SOLN
400.0000 mg | INTRAVENOUS | Status: AC
  Administered 2015-06-13: 400 mg via INTRAVENOUS

## 2015-06-13 MED ORDER — CLINDAMYCIN PHOSPHATE 900 MG/50ML IV SOLN
900.0000 mg | INTRAVENOUS | Status: AC
  Administered 2015-06-13: 900 mg via INTRAVENOUS

## 2015-06-13 MED ORDER — LOSARTAN POTASSIUM 50 MG PO TABS
50.0000 mg | ORAL_TABLET | Freq: Every day | ORAL | Status: DC
  Administered 2015-06-14: 50 mg via ORAL
  Filled 2015-06-13: qty 1

## 2015-06-13 MED ORDER — FENTANYL CITRATE (PF) 100 MCG/2ML IJ SOLN
25.0000 ug | INTRAMUSCULAR | Status: DC | PRN
  Administered 2015-06-13 (×2): 50 ug via INTRAVENOUS

## 2015-06-13 MED ORDER — LIDOCAINE HCL (CARDIAC) 20 MG/ML IV SOLN
INTRAVENOUS | Status: AC
  Filled 2015-06-13: qty 5

## 2015-06-13 MED ORDER — FENTANYL CITRATE (PF) 250 MCG/5ML IJ SOLN
INTRAMUSCULAR | Status: AC
  Filled 2015-06-13: qty 5

## 2015-06-13 MED ORDER — STERILE WATER FOR INJECTION IJ SOLN
INTRAMUSCULAR | Status: AC
Start: 2015-06-13 — End: 2015-06-13
  Filled 2015-06-13: qty 10

## 2015-06-13 MED ORDER — ASPIRIN 81 MG PO CHEW
81.0000 mg | CHEWABLE_TABLET | Freq: Every morning | ORAL | Status: DC
  Administered 2015-06-14: 81 mg via ORAL
  Filled 2015-06-13: qty 1

## 2015-06-13 MED ORDER — LIDOCAINE HCL (CARDIAC) 20 MG/ML IV SOLN
INTRAVENOUS | Status: DC | PRN
  Administered 2015-06-13: 50 mg via INTRAVENOUS

## 2015-06-13 MED ORDER — HEPARIN SODIUM (PORCINE) 5000 UNIT/ML IJ SOLN
5000.0000 [IU] | INTRAMUSCULAR | Status: AC
  Administered 2015-06-13: 5000 [IU] via SUBCUTANEOUS
  Filled 2015-06-13: qty 1

## 2015-06-13 MED ORDER — DEXAMETHASONE SODIUM PHOSPHATE 10 MG/ML IJ SOLN
INTRAMUSCULAR | Status: DC | PRN
  Administered 2015-06-13: 10 mg via INTRAVENOUS

## 2015-06-13 MED ORDER — MEPERIDINE HCL 50 MG/ML IJ SOLN
6.2500 mg | INTRAMUSCULAR | Status: DC | PRN
  Administered 2015-06-13: 12.5 mg via INTRAVENOUS

## 2015-06-13 MED ORDER — LACTATED RINGERS IV SOLN
INTRAVENOUS | Status: DC | PRN
  Administered 2015-06-13 (×2): via INTRAVENOUS

## 2015-06-13 MED ORDER — DEXAMETHASONE SODIUM PHOSPHATE 10 MG/ML IJ SOLN
INTRAMUSCULAR | Status: AC
  Filled 2015-06-13: qty 1

## 2015-06-13 MED ORDER — STERILE WATER FOR IRRIGATION IR SOLN
Status: DC | PRN
  Administered 2015-06-13: 1000 mL

## 2015-06-13 MED ORDER — HYDROMORPHONE HCL 1 MG/ML IJ SOLN: 0.2000 mg | INTRAMUSCULAR | Status: DC | PRN

## 2015-06-13 SURGICAL SUPPLY — 40 items
ATTRACTOMAT 16X20 MAGNETIC DRP (DRAPES) ×4 IMPLANT
BLADE SURG 15 STRL LF DISP TIS (BLADE) ×2 IMPLANT
BLADE SURG 15 STRL SS (BLADE) ×2
CLIP TI MEDIUM LARGE 6 (CLIP) ×4 IMPLANT
CONT SPEC 4OZ CLIKSEAL STRL BL (MISCELLANEOUS) ×4 IMPLANT
COVER SURGICAL LIGHT HANDLE (MISCELLANEOUS) ×4 IMPLANT
DRAPE WARM FLUID 44X44 (DRAPE) ×4 IMPLANT
ELECT REM PT RETURN 9FT ADLT (ELECTROSURGICAL) ×4
ELECTRODE REM PT RTRN 9FT ADLT (ELECTROSURGICAL) ×2 IMPLANT
GAUZE SPONGE 4X4 12PLY STRL (GAUZE/BANDAGES/DRESSINGS) ×4 IMPLANT
GAUZE SPONGE 4X4 16PLY XRAY LF (GAUZE/BANDAGES/DRESSINGS) ×4 IMPLANT
GLOVE BIO SURGEON STRL SZ7.5 (GLOVE) ×8 IMPLANT
GLOVE INDICATOR 8.0 STRL GRN (GLOVE) ×4 IMPLANT
GOWN BRE IMP PREV XXLGXLNG (GOWN DISPOSABLE) ×4 IMPLANT
GOWN STRL REUS W/TWL XL LVL3 (GOWN DISPOSABLE) ×4 IMPLANT
KIT BASIN OR (CUSTOM PROCEDURE TRAY) ×4 IMPLANT
KIT PROCEDURE DA VINCI SI (MISCELLANEOUS) ×2
KIT PROCEDURE DVNC SI (MISCELLANEOUS) ×2 IMPLANT
NS IRRIG 1000ML POUR BTL (IV SOLUTION) ×16 IMPLANT
PACK GENERAL/GYN (CUSTOM PROCEDURE TRAY) ×4 IMPLANT
SHEET LAVH (DRAPES) ×4 IMPLANT
SPONGE LAP 18X18 X RAY DECT (DISPOSABLE) ×8 IMPLANT
SPONGE LAP 4X18 X RAY DECT (DISPOSABLE) ×8 IMPLANT
SUT ETHILON 1 LR 30 (SUTURE) IMPLANT
SUT MNCRL AB 4-0 PS2 18 (SUTURE) ×8 IMPLANT
SUT PDS AB 0 CTX 60 (SUTURE) ×8 IMPLANT
SUT SILK 2 0 (SUTURE) ×2
SUT SILK 2 0 30  PSL (SUTURE)
SUT SILK 2 0 30 PSL (SUTURE) IMPLANT
SUT SILK 2-0 18XBRD TIE 12 (SUTURE) ×2 IMPLANT
SUT VIC AB 0 CT1 36 (SUTURE) ×8 IMPLANT
SUT VIC AB 2-0 CT2 27 (SUTURE) IMPLANT
SUT VIC AB 2-0 SH 27 (SUTURE) ×4
SUT VIC AB 2-0 SH 27X BRD (SUTURE) ×4 IMPLANT
SUT VIC AB 3-0 CTX 36 (SUTURE) IMPLANT
SUT VICRYL 0 TIES 12 18 (SUTURE) ×4 IMPLANT
TOWEL OR NON WOVEN STRL DISP B (DISPOSABLE) ×4 IMPLANT
TRAY FOLEY W/METER SILVER 14FR (SET/KITS/TRAYS/PACK) ×4 IMPLANT
TRAY FOLEY W/METER SILVER 16FR (SET/KITS/TRAYS/PACK) ×4 IMPLANT
WATER STERILE IRR 1500ML POUR (IV SOLUTION) ×4 IMPLANT

## 2015-06-13 NOTE — Anesthesia Preprocedure Evaluation (Addendum)
Anesthesia Evaluation  Patient identified by MRN, date of birth, ID band Patient awake    Reviewed: Allergy & Precautions, NPO status , Patient's Chart, lab work & pertinent test results  History of Anesthesia Complications (+) PROLONGED EMERGENCE and history of anesthetic complications  Airway Mallampati: II  TM Distance: >3 FB Neck ROM: Full    Dental  (+) Dental Advisory Given, Edentulous Upper, Edentulous Lower   Pulmonary neg pulmonary ROS,    Pulmonary exam normal breath sounds clear to auscultation       Cardiovascular hypertension, Pt. on medications Normal cardiovascular exam Rhythm:Regular Rate:Normal     Neuro/Psych Otosclerosis negative psych ROS   GI/Hepatic negative GI ROS, Neg liver ROS,   Endo/Other  Hx/o Right breast Ca Hypercholesterolemia  Renal/GU negative Renal ROS  negative genitourinary   Musculoskeletal Low back pain- Bilateral sciatica   Abdominal   Peds  Hematology   Anesthesia Other Findings   Reproductive/Obstetrics Endometrial Ca                           Anesthesia Physical Anesthesia Plan  ASA: III  Anesthesia Plan: General   Post-op Pain Management:    Induction: Intravenous  Airway Management Planned: Oral ETT  Additional Equipment:   Intra-op Plan:   Post-operative Plan: Extubation in OR  Informed Consent: I have reviewed the patients History and Physical, chart, labs and discussed the procedure including the risks, benefits and alternatives for the proposed anesthesia with the patient or authorized representative who has indicated his/her understanding and acceptance.   Dental advisory given  Plan Discussed with: CRNA, Anesthesiologist and Surgeon  Anesthesia Plan Comments: (Risks/benefits of general anesthesia discussed with patient including risk of damage to teeth, lips, gum, and tongue, nausea/vomiting, allergic reactions to  medications, and the possibility of heart attack, stroke and death.  All patient questions answered.  Patient wishes to proceed.)       Anesthesia Quick Evaluation

## 2015-06-13 NOTE — H&P (View-Only) (Signed)
Consult Note: Gyn-Onc  Consult was requested by Dr. Philis Pique for the evaluation of [name] 75 y.o. female  CC:  Chief Complaint  Patient presents with  . New Consultation    Endometrial cancer    Assessment/Plan:  [name]  is a 75 y.o.  year old with grade 1 endometrial cancer.    A detailed discussion was held with the patient and her family with regard to to her endometrial cancer diagnosis. We discussed the standard management options for uterine cancer which includes surgery followed possibly by adjuvant therapy depending on the results of surgery. The options for surgical management include a hysterectomy and removal of the tubes and ovaries possibly with removal of pelvic and para-aortic lymph nodes. A minimally invasive approach including a robotic hysterectomy or laparoscopic hysterectomy have benefits including shorter hospital stay, recovery time and better wound healing. The alternative approach is an open hysterectomy. The patient has been counseled about these surgical options and the risks of surgery in general including infection, bleeding, damage to surrounding structures (including bowel, bladder, ureters, nerves or vessels), and the postoperative risks of PE/ DVT, and lymphedema. I extensively reviewed the additional risks of robotic hysterectomy including possible need for conversion to open laparotomy.  I discussed positioning during surgery of trendelenberg and risks of minor facial swelling and care we take in preoperative positioning.  After counseling and consideration of her options, she desires to proceed with robotic assisted total hysterectomy, BSO, sentinel lymph node biopsy.   She will be seen by anesthesia for preoperative clearance and discussion of postoperative pain management.  She was given the opportunity to ask questions, which were answered to her satisfaction, and she is agreement with the above mentioned plan of care.   HPI: The patient  is a 75 year old [id number] who is seen in consultation at the request of Dr Philis Pique for grade 1 endometrial cancer.  Patient began bleeding in January 2017. It is occasional spotting. She was seen by Dr. Philis Pique who performed a transvaginal ultrasound scan on 05/16/2015 which demonstrated a uterus measuring 4.6 x 2.5 x 8.2 cm. The endometrial stripe was thickened. A Pap smear was obtained that was unremarkable and an endometrial Pipelle biopsy was performed which performed a FIGO grade 1 endometrioid adenocarcinoma. This biopsy was performed on 05/16/2015.  The patient is otherwise fairly healthy. She has some hypertension. She takes 81 mg of aspirin for prophylaxis. She's had no prior abdominal surgeries. She is thin. She has no significant family history for malignancy. Her brothers died of lung cancer and esophageal cancer but were both heavy smokers and drinkers. She does not otherwise have a history suggestive for Lynch syndrome.  Current Meds:  Outpatient Encounter Prescriptions as of 06/05/2015  Medication Sig  . aspirin 81 MG tablet Take 81 mg by mouth daily.    . Cholecalciferol 1000 UNITS capsule Take 1,000 Units by mouth daily.  Marland Kitchen losartan-hydrochlorothiazide (HYZAAR) 50-12.5 MG tablet TAKE 1 TABLET BY MOUTH DAILY  . Nutritional Supplements (PYCNOGENOL) 30 MG CAPS Take by mouth daily.  . Red Yeast Rice Extract (RED YEAST RICE PO) Take 1,200 mg by mouth daily.  . sodium fluoride-calcium carbonate (FLORICAL) 8.3-364 MG CAPS Take 1 capsule by mouth 2 (two) times daily.   No facility-administered encounter medications on file as of 06/05/2015.    Allergy:  Allergies  Allergen Reactions  . Penicillins     REACTION: Rash    Social Hx:   Social History  Social History  . Marital Status: Married    Spouse Name: N/A  . Number of Children: N/A  . Years of Education: N/A   Occupational History  . retired    Social History Main Topics  . Smoking status: Never Smoker   . Smokeless  tobacco: Never Used  . Alcohol Use: No  . Drug Use: No  . Sexual Activity: Yes    Birth Control/ Protection: Post-menopausal   Other Topics Concern  . Not on file   Social History Narrative   Regular exercise--no, occ goes to [employer]   Diet: healthy, fruits and veggies   No living will, no HCPOA (reviewed 2013)    Past Surgical Hx:  Past Surgical History  Procedure Laterality Date  . Breast surgery  2005    LUMPECTOMY  . Stapendectomy      BILATERAL  . Episiotomy    . Hysteroscopy w/d&c    . Proctoscopy    . Proctosomy repair      Past Medical Hx:  Past Medical History  Diagnosis Date  . Hypertension   . Breast cancer ([place]) 04/2003    Invasive ductal carcinoma of the right breast  . Osteopenia     Past Gynecological History:  SVD x 1  No LMP recorded. Patient is postmenopausal.  Family Hx:  Family History  Problem Relation Age of Onset  . Stroke Father   . Hypertension Mother   . Stroke Sister   . Cancer Brother     Esophageal cancer  . Cancer Brother     Lung smoker  . Parkinson's disease Brother   . Stroke Brother     Review of Systems:  Constitutional  Feels well,    ENT Normal appearing ears and nares bilaterally Skin/Breast  No rash, sores, jaundice, itching, dryness Cardiovascular  No chest pain, shortness of breath, or edema  Pulmonary  No cough or wheeze.  Gastro Intestinal  No nausea, vomitting, or diarrhoea. No bright red blood per rectum, no abdominal pain, change in bowel movement, or constipation.  Genito Urinary  No frequency, urgency, dysuria, + postmenopausal bleeding Musculo Skeletal  No myalgia, arthralgia, joint swelling or pain  Neurologic  No weakness, numbness, change in gait,  Psychology  No depression, anxiety, insomnia.   Vitals:  Blood pressure 157/82, pulse 88, temperature 97.6 F (36.4 C), temperature source Oral, resp. rate 20, height 5\' 1"  (1.549 m), weight 128 lb 9.6 oz (58.333 kg), SpO2 98 %.  Physical  Exam: WD in NAD Neck  Supple NROM, without any enlargements.  Lymph Node Survey No cervical supraclavicular or inguinal adenopathy Cardiovascular  Pulse normal rate, regularity and rhythm. S1 and S2 normal.  Lungs  Clear to auscultation bilateraly, without wheezes/crackles/rhonchi. Good air movement.  Skin  No rash/lesions/breakdown  Psychiatry  Alert and oriented to person, place, and time  Abdomen  Normoactive bowel sounds, abdomen soft, non-tender and thin without evidence of hernia.  Back No CVA tenderness Genito Urinary  Vulva/vagina: Normal external female genitalia.  No lesions. No discharge or bleeding.  Bladder/urethra:  No lesions or masses, well supported bladder  Vagina: normal  Cervix: Normal appearing, no lesions. Flush with vagina.  Uterus:  Small, mobile, no parametrial involvement or nodularity.  Adnexa: no masses. Rectal  Good tone, no masses no cul de sac nodularity.  Extremities  No bilateral cyanosis, clubbing or edema.   Donaciano Eva, MD  06/05/2015, 11:43 AM

## 2015-06-13 NOTE — Op Note (Signed)
OPERATIVE NOTE  Preoperative Diagnosis: Grade 1 endometrial cancer  Postoperative Diagnosis: Grade 1 endometrial cancer  Procedure(s) Performed: Robotic total laparoscopic hysterectomy, Bilateral salpingo oophorectomy,  Bilateral sentinel LN sampling.    Surgeon: Francetta Found.  Skeet Latch, M.D. PhD  Assistant Surgeon:Lisa Delsa Sale MD.   Cecil Cranker MSIII  Specimens: Uterus cervix ovaries, tubes, bilateral sentinel LN  Estimated Blood Loss: Min  Complications: None  Indication for Procedure: This is a 75 y.o.  who underwent prior endometrial biopsy demonstrating grade 1 endometrial cancer.  Operative Findings:  6 cm uterus.normal adnexa. No masses.   Procedure: Patient was taken to the operating room and placed under general endotracheal anesthesia without any difficulty. She is placed in the dorsal lithotomy position and secured to the operative table over the chest with tape.   The patient was prepped and draped.  Iso- cyanine green was injected into the cervical submucosa and stroma at 3 and 9 o'clock and the uterine manipulator placed within the endometrial cavity. The appropriately sized Koh ring was circumferentially around the cervix. The balloon was placed within the vagina. An OG tube was present and functional. At an area on the left in line with the nipple approximately 2 cm below a 5 mm Optiview inserted under direct visualization. The abdomen was insufflated to 15 mm of mercury and the pressure never deviated above that throughout the remainder of the procedure. Maximum Trendelenburg positioning was obtained. At approximately 22 cm proximal to the symphysis pubis an incision was made just superior to the umbilicus. This area was infiltrated with lidocaine as well as the location 10 cm lateral to this incision and 2 cm superior to the left anterior superior iliac spine. Incisions were made. 10 mm trocar was inserted in the superior umbilicus incision. 8 millimeter robotic ports  were placed in the other 3 incisions. The left upper quadrant port site was replaced with a 12 mm port. This was all completed under direct visualization. The small and large bowel were reflected as much as possible into the upper abdomen. The robot was docked and instruments placed.  The right round ligament was transected and the ureter was identified. The right sentinel LN was identified lateral to the right external iliac artery.  The left sentinel LN was identified next to the left external iliac vein.  The right infundibulopelvic ligament was cauterized and transected The retroperitoneal space was entered on the right and the peritoneum incised to the level of the vesicouterine ligament anteriorly. The bladder flap was created using Bovie cautery. The peritoneal dissection was continued inferiorly and across the inferior most aspect of the cervix. In this manner the urethra was deflected inferiorly. The bladder flap was further developed. The uterine vessels on the right were skeletonized ligated and transected.  The left ureter was identified. The left gonadal vessels were cauterized and transected. The broad ligament was skeletonized posteriorly to the level of the cervix and the peritoneum dissected free from the cervix and in this fashion the ureter was deflected inferiorly. The anterior peritoneum was further dissected and the bladder flap appropriately developed. The uterine vessels were skeletonized cauterized and transected. The balloon and the vagina was then maximally insufflated. A colpotomy incision was made circumferentially and the uterus cervix ovaries and tubes were ivered from the vagina. The Koh ring was removed and the balloon was replaced.  The pelvis was copiously irrigated and drained and hemostasis was assured. The vaginal cuff was closed with a running 0 Vicryl suture ligature. The needle  was removed under direct visualization. The operative site is once again visualized and  hemostasis was assured. The instruments were removed from the abdomen and pelvis and the port sites irrigated. The LUQ fascia was closed with an interrupted 0 Vicryl  suture.  Skin incisions were closed with a subcuticular suture.  The vaginal vault was cleared with a moist sponge stick.  Sponge, lap and needle counts were correct x 3.    The patient had sequential compression devices and preoperative Lovenox for VTE prophylaxis and will receive Lovenox postoperatively.          Disposition: PACU - hemodynamically stable.         Condition:stable Foley draining clear urine.

## 2015-06-13 NOTE — Transfer of Care (Signed)
Immediate Anesthesia Transfer of Care Note  Patient: [name]  Procedure(s) Performed: Procedure(s): XI ROBOTIC ASSISTED TOTAL ABDOMINAL LAPAROSCOPIC HYSTERECTOMY (N/A) SALPINGO OOPHORECTOMY (Bilateral) SENTINEL  LYMPH NODE BIOPSY (N/A)  Patient Location: PACU  Anesthesia Type:General  Level of Consciousness: awake, alert  and oriented  Airway & Oxygen Therapy: Patient Spontanous Breathing and Patient connected to face mask oxygen  Post-op Assessment: Report given to RN and Post -op Vital signs reviewed and stable  Post vital signs: Reviewed and stable  Last Vitals:  Filed Vitals:   06/13/15 0941  BP: 146/86  Pulse: 88  Temp: 36.3 C  Resp: 18    Last Pain: There were no vitals filed for this visit.       Complications: No apparent anesthesia complications

## 2015-06-13 NOTE — Interval H&P Note (Signed)
History and Physical Interval Note:  06/13/2015 11:41 AM  [name]  has presented today for surgery, with the diagnosis of ENDOMETRIAL CANCER  The various methods of treatment have been discussed with the patient and family. After consideration of risks, benefits and other options for treatment, the patient has consented to  Procedure(s): XI ROBOTIC ASSISTED TOTAL ABDOMINAL LAPAROSCOPIC HYSTERECTOMY (N/A) SALPINGO OOPHORECTOMY (Bilateral) SENTINEL  LYMPH NODE BIOPSY (N/A) as a surgical intervention .  The patient's history has been reviewed, patient examined, no change in status, stable for surgery.  I have reviewed the patient's chart and labs.  Questions were answered to the patient's satisfaction.     Cesar Chavez, [facility]

## 2015-06-13 NOTE — Anesthesia Procedure Notes (Signed)
Procedure Name: Intubation Date/Time: 06/13/2015 12:34 PM Performed by: Glory Buff Pre-anesthesia Checklist: Patient identified, Emergency Drugs available, Suction available and Patient being monitored Patient Re-evaluated:Patient Re-evaluated prior to inductionOxygen Delivery Method: Circle System Utilized Preoxygenation: Pre-oxygenation with 100% oxygen Intubation Type: IV induction Ventilation: Mask ventilation without difficulty Laryngoscope Size: Miller and 3 Grade View: Grade I Tube type: Oral Tube size: 7.0 mm Number of attempts: 1 Airway Equipment and Method: Stylet and Oral airway Placement Confirmation: ETT inserted through vocal cords under direct vision,  positive ETCO2 and breath sounds checked- equal and bilateral Secured at: 20 cm Tube secured with: Tape Dental Injury: Teeth and Oropharynx as per pre-operative assessment

## 2015-06-14 DIAGNOSIS — C541 Malignant neoplasm of endometrium: Secondary | ICD-10-CM | POA: Diagnosis present

## 2015-06-14 DIAGNOSIS — Z801 Family history of malignant neoplasm of trachea, bronchus and lung: Secondary | ICD-10-CM | POA: Diagnosis not present

## 2015-06-14 DIAGNOSIS — Z800 Family history of malignant neoplasm of digestive organs: Secondary | ICD-10-CM | POA: Diagnosis not present

## 2015-06-14 DIAGNOSIS — Z7982 Long term (current) use of aspirin: Secondary | ICD-10-CM | POA: Diagnosis not present

## 2015-06-14 DIAGNOSIS — Z853 Personal history of malignant neoplasm of breast: Secondary | ICD-10-CM | POA: Diagnosis not present

## 2015-06-14 DIAGNOSIS — E7800 Pure hypercholesterolemia, unspecified: Secondary | ICD-10-CM | POA: Diagnosis present

## 2015-06-14 DIAGNOSIS — Z8249 Family history of ischemic heart disease and other diseases of the circulatory system: Secondary | ICD-10-CM | POA: Diagnosis not present

## 2015-06-14 DIAGNOSIS — M8580 Other specified disorders of bone density and structure, unspecified site: Secondary | ICD-10-CM | POA: Diagnosis present

## 2015-06-14 DIAGNOSIS — Z823 Family history of stroke: Secondary | ICD-10-CM | POA: Diagnosis not present

## 2015-06-14 DIAGNOSIS — I10 Essential (primary) hypertension: Secondary | ICD-10-CM | POA: Diagnosis present

## 2015-06-14 LAB — BASIC METABOLIC PANEL
ANION GAP: 9 (ref 5–15)
BUN: 19 mg/dL (ref 6–20)
CO2: 26 mmol/L (ref 22–32)
Calcium: 8.8 mg/dL — ABNORMAL LOW (ref 8.9–10.3)
Chloride: 105 mmol/L (ref 101–111)
Creatinine, Ser: 0.95 mg/dL (ref 0.44–1.00)
GFR calc Af Amer: >60 mL/min (ref >60)
GFR, EST NON AFRICAN AMERICAN: 56 mL/min — AB (ref >60)
GLUCOSE: 154 mg/dL — AB (ref 65–99)
POTASSIUM: 3.8 mmol/L (ref 3.5–5.1)
Sodium: 140 mmol/L (ref 135–145)

## 2015-06-14 LAB — CBC
HCT: 37.1 % (ref 36.0–46.0)
Hemoglobin: 12.6 g/dL (ref 12.0–15.0)
MCH: 29 pg (ref 26.0–34.0)
MCHC: 34 g/dL (ref 30.0–36.0)
MCV: 85.5 fL (ref 78.0–100.0)
PLATELETS: 190 10*3/uL (ref 150–400)
RBC: 4.34 MIL/uL (ref 3.87–5.11)
RDW: 13.2 % (ref 11.5–15.5)
WBC: 12 10*3/uL — AB (ref 4.0–10.5)

## 2015-06-14 MED ORDER — TRAMADOL HCL 50 MG PO TABS: 50.0000 mg | ORAL_TABLET | Freq: Four times a day (QID) | ORAL | Status: DC | PRN

## 2015-06-14 NOTE — Discharge Instructions (Signed)

## 2015-06-14 NOTE — Progress Notes (Signed)
All DC instructions reviewed and all questions and concerns were addressed. Pt is alert and oriented times 4, VSS, pain is controlled, skin intact, no s/s of heavy or uncontrolled vaginal bleeding, no apparent s/s of distress or discomfort at this time. Pt DC home with husband.

## 2015-06-14 NOTE — Anesthesia Postprocedure Evaluation (Signed)
Anesthesia Post Note  Patient: [name]  Procedure(s) Performed: Procedure(s) (LRB): XI ROBOTIC ASSISTED TOTAL ABDOMINAL LAPAROSCOPIC HYSTERECTOMY (N/A) SALPINGO OOPHORECTOMY (Bilateral) SENTINEL  LYMPH NODE BIOPSY (N/A)  Patient location during evaluation: PACU Anesthesia Type: General Level of consciousness: awake and alert Pain management: pain level controlled Vital Signs Assessment: post-procedure vital signs reviewed and stable Respiratory status: spontaneous breathing, nonlabored ventilation, respiratory function stable and patient connected to nasal cannula oxygen Cardiovascular status: blood pressure returned to baseline and stable Postop Assessment: no signs of nausea or vomiting Anesthetic complications: no    Last Vitals:  Filed Vitals:   06/14/15 0143 06/14/15 0556  BP: 90/54 103/59  Pulse: 81 76  Temp: 36.7 C 36.7 C  Resp: 16 14    Last Pain:  Filed Vitals:   06/14/15 0556  PainSc: Asleep                 Shantae Vantol J

## 2015-06-14 NOTE — Discharge Summary (Signed)
Physician Discharge Summary  Patient ID: [name] MRN: [id number] DOB/AGE: 77-12-1940 77 y.o.  Admit date: 06/13/2015 Discharge date: 06/14/2015  Admission Diagnoses: Endometrial cancer [facility])  Discharge Diagnoses:  Principal Problem:   Endometrial cancer [facility])   Discharged Condition:  The patient is in good condition and stable for discharge.    Hospital Course: On 06/13/2015, the patient underwent the following: Procedure(s): XI ROBOTIC ASSISTED TOTAL ABDOMINAL LAPAROSCOPIC HYSTERECTOMY SALPINGO OOPHORECTOMY SENTINEL  LYMPH NODE BIOPSY.  The postoperative course was uneventful.  She was discharged to home on postoperative day 1 tolerating a regular diet, minimal pain.  Consults: None  Significant Diagnostic Studies: None  Treatments: surgery: see above  Discharge Exam: Blood pressure 121/61, pulse 72, temperature 98.6 F (37 C), temperature source Oral, resp. rate 14, height 5\' 1"  (1.549 m), weight 128 lb (58.06 kg), SpO2 96 %. General appearance: alert, cooperative, appears stated age, no distress and hard of hearing Resp: clear to auscultation bilaterally Cardio: regular rate and rhythm, S1, S2 normal, no murmur, click, rub or gallop GI: soft, non-tender; bowel sounds normal; no masses,  no organomegaly Extremities: extremities normal, atraumatic, no cyanosis or edema Incision/Wound: Lap sites to the abdomen with dermabond without erythema or drainage.  Minimal amount of bloody, dried, drainage removed from the umbilicus with no active bleeding noted  Disposition: Home      Discharge Instructions    Call MD for:  difficulty breathing, headache or visual disturbances    Complete by:  As directed      Call MD for:  extreme fatigue    Complete by:  As directed      Call MD for:  hives    Complete by:  As directed      Call MD for:  persistant dizziness or light-headedness    Complete by:  As directed      Call MD for:  persistant nausea and vomiting    Complete by:   As directed      Call MD for:  redness, tenderness, or signs of infection (pain, swelling, redness, odor or green/yellow discharge around incision site)    Complete by:  As directed      Call MD for:  severe uncontrolled pain    Complete by:  As directed      Call MD for:  temperature >100.4    Complete by:  As directed      Diet - low sodium heart healthy    Complete by:  As directed      Driving Restrictions    Complete by:  As directed   No driving for 1 week.  Do not take narcotics and drive.     Increase activity slowly    Complete by:  As directed      Lifting restrictions    Complete by:  As directed   No lifting greater than 10 lbs.     Sexual Activity Restrictions    Complete by:  As directed   No sexual activity, nothing in the vagina, for 8 weeks.            Medication List    TAKE these medications        aspirin 81 MG tablet  Take 81 mg by mouth every morning.     BIOTIN PO  Take 1 tablet by mouth every morning.     Cholecalciferol 1000 units capsule  Take 1,000 Units by mouth every morning.     losartan-hydrochlorothiazide 50-12.5 MG tablet  Commonly known as:  HYZAAR  TAKE 1 TABLET BY MOUTH DAILY     Pycnogenol 30 MG Caps  Take 30 mg by mouth every morning.     RED YEAST RICE PO  Take 1,200 mg by mouth every morning.     sodium fluoride-calcium carbonate 8.3-364 MG Caps capsule  Commonly known as:  FLORICAL  Take 1 capsule by mouth 2 (two) times daily.     traMADol 50 MG tablet  Commonly known as:  ULTRAM  Take 1-2 tablets (50-100 mg total) by mouth every 6 (six) hours as needed for moderate pain.       Follow-up Information    Follow up with Donaciano Eva, MD On 07/10/2015.   Specialty:  Obstetrics and Gynecology   Why:  at 2:15pm at the [facility] information:   [place] [id number] [phone]       Greater than thirty minutes were spend for face to face discharge instructions and discharge  orders/summary in EPIC.   Signed: Ryliee Figge DEAL 06/14/2015, 9:59 AM

## 2015-06-19 ENCOUNTER — Telehealth: Payer: Self-pay | Admitting: Gynecologic Oncology

## 2015-06-19 NOTE — Telephone Encounter (Signed)
Attempted to return call to patient.  Unable to leave message on either number.  Patient spoke with our RN fifteen min ago and said she would call back because she has having difficulty hearing.

## 2015-06-19 NOTE — Telephone Encounter (Signed)
Called and spoke with the patient's husband since the patient was asleep.  Asked for the patient to please call the office with an update on how she is doing post-op and to discuss final pathology.

## 2015-06-20 ENCOUNTER — Other Ambulatory Visit (HOSPITAL_COMMUNITY): Payer: Self-pay

## 2015-06-20 ENCOUNTER — Telehealth: Payer: Self-pay | Admitting: Gynecologic Oncology

## 2015-06-20 NOTE — Telephone Encounter (Signed)
Spoke with patient about final path results and Dr. Leone Brand recommendations for no adjuvant therapy.  Patient is doing well post-op.  Follow up appt arranged.  Advised to call for any needs or concerns.

## 2015-06-26 ENCOUNTER — Encounter (HOSPITAL_COMMUNITY): Payer: Self-pay

## 2015-07-10 ENCOUNTER — Encounter: Payer: Self-pay | Admitting: Gynecologic Oncology

## 2015-07-10 ENCOUNTER — Ambulatory Visit: Payer: Medicare Other | Attending: Gynecologic Oncology | Admitting: Gynecologic Oncology

## 2015-07-10 VITALS — BP 130/79 | HR 84 | Temp 98.0°F | Resp 18 | Ht 61.0 in | Wt 127.0 lb

## 2015-07-10 DIAGNOSIS — Z7982 Long term (current) use of aspirin: Secondary | ICD-10-CM | POA: Insufficient documentation

## 2015-07-10 DIAGNOSIS — Z8379 Family history of other diseases of the digestive system: Secondary | ICD-10-CM | POA: Diagnosis not present

## 2015-07-10 DIAGNOSIS — C541 Malignant neoplasm of endometrium: Secondary | ICD-10-CM | POA: Diagnosis not present

## 2015-07-10 DIAGNOSIS — Z853 Personal history of malignant neoplasm of breast: Secondary | ICD-10-CM | POA: Insufficient documentation

## 2015-07-10 DIAGNOSIS — Z801 Family history of malignant neoplasm of trachea, bronchus and lung: Secondary | ICD-10-CM | POA: Insufficient documentation

## 2015-07-10 DIAGNOSIS — Z90710 Acquired absence of both cervix and uterus: Secondary | ICD-10-CM | POA: Diagnosis not present

## 2015-07-10 DIAGNOSIS — I10 Essential (primary) hypertension: Secondary | ICD-10-CM | POA: Diagnosis not present

## 2015-07-10 DIAGNOSIS — Z885 Allergy status to narcotic agent status: Secondary | ICD-10-CM | POA: Insufficient documentation

## 2015-07-10 DIAGNOSIS — Z9889 Other specified postprocedural states: Secondary | ICD-10-CM | POA: Diagnosis not present

## 2015-07-10 DIAGNOSIS — Z8249 Family history of ischemic heart disease and other diseases of the circulatory system: Secondary | ICD-10-CM | POA: Diagnosis not present

## 2015-07-10 DIAGNOSIS — Z90722 Acquired absence of ovaries, bilateral: Secondary | ICD-10-CM

## 2015-07-10 DIAGNOSIS — M8580 Other specified disorders of bone density and structure, unspecified site: Secondary | ICD-10-CM | POA: Insufficient documentation

## 2015-07-10 DIAGNOSIS — Z880 Allergy status to penicillin: Secondary | ICD-10-CM | POA: Insufficient documentation

## 2015-07-10 DIAGNOSIS — Z823 Family history of stroke: Secondary | ICD-10-CM | POA: Insufficient documentation

## 2015-07-10 NOTE — Patient Instructions (Signed)
Plan to follow up in six months with Dr. Philis Pique and Dr. Denman George or Dr. Skeet Latch in one year.  Please call our office at [phone] after seeing Dr. Philis Pique to schedule your appt for June 2018.  Please call any questions or concerns.

## 2015-07-10 NOTE — Progress Notes (Signed)
FOLLOW-UP: ENDOMETRIAL CANCER  Assessment:    75 y.o. 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   S/p robotic assisted total hysterectomy, BSO, sentinel lymph node mapping on 06/13/15. no LVSI, 40% myometrial invasion, negative lymph nodes.   Plan: 1) Pathology reports reviewed today 2) Treatment counseling - Very low risk (<5%) for recurrence given age, grade, depth of myometrial invasion and LVSI status. Multidisciplinary tumor board recommendation is for routine surveillance with pelvic exams every 6 months x 5 years, at which time she can return to annual visits. Pap smear is not recommended for routine surveillance of endometrial cancer. Discussed signs and symptoms of recurrence including vaginal bleeding or discharge, leg pain or swelling and changes in bowel or bladder habits. She was given the opportunity to ask questions, which were answered to her satisfaction, and she is agreement with the above mentioned plan of care.  3)  Return to clinic in 6 months to see Dr Philis Pique and in 12 months to see Dr Skeet Latch or myself.  HPI:  [name] is a 75 y.o. year old No obstetric history on file. initially seen in consultation on 06/05/15 referred by Dr Philis Pique for grade 2 endometrial cancer.  She then underwent a robotic hysterectomy, BSO, SLN biopsy on 06/13/15 with Dr Janie Morning without complications.  Her postoperative course was uncomplicated.  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  She is seen today for a postoperative check and to discuss her pathology results and treatment plan.  Since discharge from the hospital, she is feeling well.  She has improving appetite, normal bowel and bladder function, and pain controlled with minimal PO medication. She has no other complaints today.  Past Medical History  Diagnosis Date  .  Hypertension   . Breast cancer ([place]) 04/2003    Invasive ductal carcinoma of the right breast  . Osteopenia   . Complication of anesthesia     "slow to wake up"  . Hard of hearing    Past Surgical History  Procedure Laterality Date  . Breast surgery  2005    LUMPECTOMY  . Stapendectomy      BILATERAL  . Episiotomy    . Hysteroscopy w/d&c    . Proctoscopy    . Proctosomy repair    . Robotic assisted total hysterectomy N/A 06/13/2015    Procedure: XI ROBOTIC ASSISTED TOTAL ABDOMINAL LAPAROSCOPIC HYSTERECTOMY;  Surgeon: Janie Morning, MD;  Location: [facility];  Service: Gynecology;  Laterality: N/A;  . Salpingoophorectomy Bilateral 06/13/2015    Procedure: SALPINGO OOPHORECTOMY;  Surgeon: Janie Morning, MD;  Location: [facility];  Service: Gynecology;  Laterality: Bilateral;  . Lymph node biopsy N/A 06/13/2015    Procedure: SENTINEL  LYMPH NODE BIOPSY;  Surgeon: Janie Morning, MD;  Location: [facility];  Service: Gynecology;  Laterality: N/A;   Family History  Problem Relation Age of Onset  . Stroke Father   . Hypertension Mother   . Stroke Sister   . Cancer Brother     Esophageal cancer  . Cancer Brother     Lung smoker  . Parkinson's disease Brother   . Stroke Brother    Social History   Social History  . Marital Status: Married    Spouse Name: N/A  . Number of Children: N/A  . Years of Education: N/A   Occupational History  . retired  Social History Main Topics  . Smoking status: Never Smoker   . Smokeless tobacco: Never Used  . Alcohol Use: No  . Drug Use: No  . Sexual Activity: Yes    Birth Control/ Protection: Post-menopausal   Other Topics Concern  . Not on file   Social History Narrative   Regular exercise--no, occ goes to [employer]   Diet: healthy, fruits and veggies   No living will, no HCPOA (reviewed 2013)   Current Outpatient Prescriptions on File Prior to Visit  Medication Sig Dispense Refill  . aspirin 81 MG tablet Take 81 mg by mouth every morning.     Marland Kitchen  BIOTIN PO Take 1 tablet by mouth every morning.    . Nutritional Supplements (PYCNOGENOL) 30 MG CAPS Take 30 mg by mouth every morning.     . Red Yeast Rice Extract (RED YEAST RICE PO) Take 1,200 mg by mouth every morning.     . sodium fluoride-calcium carbonate (FLORICAL) 8.3-364 MG CAPS Take 1 capsule by mouth 2 (two) times daily.    . Cholecalciferol 1000 UNITS capsule Take 1,000 Units by mouth every morning.      No current facility-administered medications on file prior to visit.   Allergies  Allergen Reactions  . Morphine And Related     Patient requested not to have morphine. States it is too strong for her.   . Penicillins Rash    Has patient had a PCN reaction causing immediate rash, facial/tongue/throat swelling, SOB or lightheadedness with hypotension: Yes Has patient had a PCN reaction causing severe rash involving mucus membranes or skin necrosis: Unknown Has patient had a PCN reaction that required hospitalization No Has patient had a PCN reaction occurring within the last 10 years: No If all of the above answers are "NO", then may proceed with Cephalosporin use.      Review of systems: Constitutional:  She has no weight gain or weight loss. She has no fever or chills. Eyes: No blurred vision Ears, Nose, Mouth, Throat: No dizziness, headaches or changes in hearing. No mouth sores. Cardiovascular: No chest pain, palpitations or edema. Respiratory:  No shortness of breath, wheezing or cough Gastrointestinal: She has normal bowel movements without diarrhea or constipation. She denies any nausea or vomiting. She denies blood in her stool or heart burn. Genitourinary:  She denies pelvic pain, pelvic pressure or changes in her urinary function. She has no hematuria, dysuria, or incontinence. She has no irregular vaginal bleeding or vaginal discharge Musculoskeletal: Denies muscle weakness or joint pains.  Skin:  She has no skin changes, rashes or itching Neurological:  Denies  dizziness or headaches. No neuropathy, no numbness or tingling. Psychiatric:  She denies depression or anxiety. Hematologic/Lymphatic:   No easy bruising or bleeding   Physical Exam: Blood pressure 130/79, pulse 84, temperature 98 F (36.7 C), temperature source Oral, resp. rate 18, height 5\' 1"  (1.549 m), weight 127 lb (57.607 kg), SpO2 98 %. General: Well dressed, well nourished in no apparent distress.   HEENT:  Normocephalic and atraumatic, no lesions.  Extraocular muscles intact. Sclerae anicteric. Pupils equal, round, reactive. No mouth sores or ulcers. Thyroid is normal size, not nodular, midline. Skin:  No lesions or rashes. Abdomen:  Soft, nontender, nondistended.  No palpable masses.  No hepatosplenomegaly.  No ascites. Normal bowel sounds.  No hernias.  Incisions are well healed Genitourinary: Normal EGBUS  Vaginal cuff intact.  No bleeding or discharge.  No cul de sac fullness. Extremities: No cyanosis, clubbing  or edema.  No calf tenderness or erythema. No palpable cords. Psychiatric: Mood and affect are appropriate. Neurological: Awake, alert and oriented x 3. Sensation is intact, no neuropathy.  Musculoskeletal: No pain, normal strength and range of motion.   20 minutes of direct face to face counseling time was spent with the patient. This included discussion about prognosis, therapy recommendations and postoperative side effects and are beyond the scope of routine postoperative care.  Donaciano Eva, MD

## 2015-07-17 ENCOUNTER — Encounter: Payer: Self-pay | Admitting: Internal Medicine

## 2015-10-12 ENCOUNTER — Other Ambulatory Visit: Payer: Self-pay | Admitting: Family Medicine

## 2015-10-12 DIAGNOSIS — R928 Other abnormal and inconclusive findings on diagnostic imaging of breast: Secondary | ICD-10-CM

## 2015-11-15 ENCOUNTER — Ambulatory Visit
Admission: RE | Admit: 2015-11-15 | Discharge: 2015-11-15 | Disposition: A | Discharge status: Home / Self Care | Payer: Medicare Other | Source: Ambulatory Visit | Attending: Family Medicine | Admitting: Family Medicine

## 2015-11-15 DIAGNOSIS — R928 Other abnormal and inconclusive findings on diagnostic imaging of breast: Secondary | ICD-10-CM

## 2015-12-01 DIAGNOSIS — B078 Other viral warts: Secondary | ICD-10-CM | POA: Diagnosis not present

## 2015-12-01 DIAGNOSIS — L570 Actinic keratosis: Secondary | ICD-10-CM | POA: Diagnosis not present

## 2015-12-01 DIAGNOSIS — X32XXXD Exposure to sunlight, subsequent encounter: Secondary | ICD-10-CM | POA: Diagnosis not present

## 2016-02-13 ENCOUNTER — Other Ambulatory Visit (INDEPENDENT_AMBULATORY_CARE_PROVIDER_SITE_OTHER): Payer: Medicare Other

## 2016-02-13 ENCOUNTER — Encounter: Payer: Self-pay | Admitting: *Deleted

## 2016-02-13 ENCOUNTER — Other Ambulatory Visit: Payer: Self-pay | Admitting: Family Medicine

## 2016-02-13 DIAGNOSIS — Z1211 Encounter for screening for malignant neoplasm of colon: Secondary | ICD-10-CM | POA: Diagnosis not present

## 2016-02-13 LAB — FECAL OCCULT BLOOD, IMMUNOCHEMICAL: Fecal Occult Bld: NEGATIVE

## 2016-03-04 ENCOUNTER — Other Ambulatory Visit: Payer: Self-pay

## 2016-03-04 ENCOUNTER — Encounter (INDEPENDENT_AMBULATORY_CARE_PROVIDER_SITE_OTHER): Payer: Self-pay

## 2016-03-04 ENCOUNTER — Ambulatory Visit (INDEPENDENT_AMBULATORY_CARE_PROVIDER_SITE_OTHER): Payer: Medicare Other

## 2016-03-04 VITALS — BP 132/82 | HR 81 | Temp 97.5°F | Ht 61.0 in | Wt 129.5 lb

## 2016-03-04 DIAGNOSIS — R799 Abnormal finding of blood chemistry, unspecified: Secondary | ICD-10-CM

## 2016-03-04 DIAGNOSIS — Z0000 Encounter for general adult medical examination without abnormal findings: Secondary | ICD-10-CM | POA: Insufficient documentation

## 2016-03-04 DIAGNOSIS — M8580 Other specified disorders of bone density and structure, unspecified site: Secondary | ICD-10-CM

## 2016-03-04 DIAGNOSIS — I10 Essential (primary) hypertension: Secondary | ICD-10-CM

## 2016-03-04 DIAGNOSIS — E7800 Pure hypercholesterolemia, unspecified: Secondary | ICD-10-CM | POA: Diagnosis not present

## 2016-03-04 LAB — LIPID PANEL
CHOLESTEROL: 187 mg/dL (ref 0–200)
HDL: 52.2 mg/dL (ref >39.00)
LDL Cholesterol: 118 mg/dL — ABNORMAL HIGH (ref 0–99)
NonHDL: 134.3
Total CHOL/HDL Ratio: 4
Triglycerides: 81 mg/dL (ref 0.0–149.0)
VLDL: 16.2 mg/dL (ref 0.0–40.0)

## 2016-03-04 LAB — CBC WITH DIFFERENTIAL/PLATELET
Basophils Absolute: 0 10*3/uL (ref 0.0–0.1)
Basophils Relative: 0.5 % (ref 0.0–3.0)
EOS PCT: 3 % (ref 0.0–5.0)
Eosinophils Absolute: 0.2 10*3/uL (ref 0.0–0.7)
HCT: 42.6 % (ref 36.0–46.0)
HEMOGLOBIN: 14.3 g/dL (ref 12.0–15.0)
LYMPHS PCT: 29.4 % (ref 12.0–46.0)
Lymphs Abs: 1.9 10*3/uL (ref 0.7–4.0)
MCHC: 33.7 g/dL (ref 30.0–36.0)
MCV: 86 fl (ref 78.0–100.0)
MONO ABS: 0.5 10*3/uL (ref 0.1–1.0)
Monocytes Relative: 7.7 % (ref 3.0–12.0)
Neutro Abs: 3.9 10*3/uL (ref 1.4–7.7)
Neutrophils Relative %: 59.4 % (ref 43.0–77.0)
Platelets: 198 10*3/uL (ref 150.0–400.0)
RBC: 4.95 Mil/uL (ref 3.87–5.11)
RDW: 13.2 % (ref 11.5–15.5)
WBC: 6.6 10*3/uL (ref 4.0–10.5)

## 2016-03-04 LAB — COMPREHENSIVE METABOLIC PANEL
ALBUMIN: 4.1 g/dL (ref 3.5–5.2)
ALK PHOS: 45 U/L (ref 39–117)
ALT: 18 U/L (ref 0–35)
AST: 20 U/L (ref 0–37)
BILIRUBIN TOTAL: 0.5 mg/dL (ref 0.2–1.2)
BUN: 17 mg/dL (ref 6–23)
CO2: 27 mEq/L (ref 19–32)
CREATININE: 0.89 mg/dL (ref 0.40–1.20)
Calcium: 9.6 mg/dL (ref 8.4–10.5)
Chloride: 107 mEq/L (ref 96–112)
GFR: 65.22 mL/min (ref >60.00)
GLUCOSE: 100 mg/dL — AB (ref 70–99)
Potassium: 3.9 mEq/L (ref 3.5–5.1)
SODIUM: 142 meq/L (ref 135–145)
TOTAL PROTEIN: 6.7 g/dL (ref 6.0–8.3)

## 2016-03-04 LAB — VITAMIN D 25 HYDROXY (VIT D DEFICIENCY, FRACTURES): VITD: 49.42 ng/mL (ref 30.00–100.00)

## 2016-03-04 NOTE — Patient Instructions (Signed)
Ms. Loubier , Thank you for taking time to come for your Medicare Wellness Visit. I appreciate your ongoing commitment to your health goals. Please review the following plan we discussed and let me know if I can assist you in the future.   These are the goals we discussed: Goals    . Increase physical activity          Starting 03/04/2016, I will continue to walk at least 15-20 min 5 days per week.        This is a list of the screening recommended for you and due dates:  Health Maintenance  Topic Date Due  . Mammogram  05/03/2016  . Tetanus Vaccine  02/16/2017  . Flu Shot  Completed  . DEXA scan (bone density measurement)  Completed  . Shingles Vaccine  Completed  . Pneumonia vaccines  Completed   Preventive Care for Adults  A healthy lifestyle and preventive care can promote health and wellness. Preventive health guidelines for adults include the following key practices.  . A routine yearly physical is a good way to check with your health care provider about your health and preventive screening. It is a chance to share any concerns and updates on your health and to receive a thorough exam.  . Visit your dentist for a routine exam and preventive care every 6 months. Brush your teeth twice a day and floss once a day. Good oral hygiene prevents tooth decay and gum disease.  . The frequency of eye exams is based on your age, health, family medical history, use  of contact lenses, and other factors. Follow your health care provider's ecommendations for frequency of eye exams.  . Eat a healthy diet. Foods like vegetables, fruits, whole grains, low-fat dairy products, and lean protein foods contain the nutrients you need without too many calories. Decrease your intake of foods high in solid fats, added sugars, and salt. Eat the right amount of calories for you. Get information about a proper diet from your health care provider, if necessary.  . Regular physical exercise is one of the  most important things you can do for your health. Most adults should get at least 150 minutes of moderate-intensity exercise (any activity that increases your heart rate and causes you to sweat) each week. In addition, most adults need muscle-strengthening exercises on 2 or more days a week.  Silver Sneakers may be a benefit available to you. To determine eligibility, you may visit the website: www.silversneakers.com or contact program at [phone] Mon-Fri between 8AM-8PM.   . Maintain a healthy weight. The body mass index (BMI) is a screening tool to identify possible weight problems. It provides an estimate of body fat based on height and weight. Your health care provider can find your BMI and can help you achieve or maintain a healthy weight.   For adults 20 years and older: ? A BMI below 18.5 is considered underweight. ? A BMI of 18.5 to 24.9 is normal. ? A BMI of 25 to 29.9 is considered overweight. ? A BMI of 30 and above is considered obese.   . Maintain normal blood lipids and cholesterol levels by exercising and minimizing your intake of saturated fat. Eat a balanced diet with plenty of fruit and vegetables. Blood tests for lipids and cholesterol should begin at age 35 and be repeated every 5 years. If your lipid or cholesterol levels are high, you are over 50, or you are at high risk for heart disease, you may  need your cholesterol levels checked more frequently. Ongoing high lipid and cholesterol levels should be treated with medicines if diet and exercise are not working.  . If you smoke, find out from your health care provider how to quit. If you do not use tobacco, please do not start.  . If you choose to drink alcohol, please do not consume more than 2 drinks per day. One drink is considered to be 12 ounces (355 mL) of beer, 5 ounces (148 mL) of wine, or 1.5 ounces (44 mL) of liquor.  . If you are 44-3 years old, ask your health care provider if you should take aspirin to  prevent strokes.  . Use sunscreen. Apply sunscreen liberally and repeatedly throughout the day. You should seek shade when your shadow is shorter than you. Protect yourself by wearing long sleeves, pants, a wide-brimmed hat, and sunglasses year round, whenever you are outdoors.  . Once a month, do a whole body skin exam, using a mirror to look at the skin on your back. Tell your health care provider of new moles, moles that have irregular borders, moles that are larger than a pencil eraser, or moles that have changed in shape or color.

## 2016-03-04 NOTE — Progress Notes (Signed)
Pre visit review using our clinic review tool, if applicable. No additional management support is needed unless otherwise documented below in the visit note. 

## 2016-03-04 NOTE — Progress Notes (Signed)
Subjective:   [name] is a 76 y.o. female who presents for Medicare Annual (Subsequent) preventive examination.  Review of Systems:  N/A Cardiac Risk Factors include: advanced age (>72men, >33 women);dyslipidemia;hypertension     Objective:     Vitals: BP 132/82 (BP Location: Left Arm, Patient Position: Sitting, Cuff Size: Normal)   Pulse 81   Temp 97.5 F (36.4 C) (Oral)   Ht 5\' 1"  (1.549 m) Comment: no shoes  Wt 129 lb 8 oz (58.7 kg)   SpO2 96%   BMI 24.47 kg/m   Body mass index is 24.47 kg/m.   Tobacco History  Smoking Status  . Never Smoker  Smokeless Tobacco  . Never Used     Counseling given: No   Past Medical History:  Diagnosis Date  . Breast cancer ([place]) 04/2003   Invasive ductal carcinoma of the right breast  . Complication of anesthesia    "slow to wake up"  . Hard of hearing   . Hypertension   . Osteopenia    Past Surgical History:  Procedure Laterality Date  . BREAST SURGERY  2005   LUMPECTOMY  . EPISIOTOMY    . HYSTEROSCOPY W/D&C    . LYMPH NODE BIOPSY N/A 06/13/2015   Procedure: SENTINEL  LYMPH NODE BIOPSY;  Surgeon: Janie Morning, MD;  Location: [facility];  Service: Gynecology;  Laterality: N/A;  . PROCTOSCOPY    . PROCTOSOMY REPAIR    . ROBOTIC ASSISTED TOTAL HYSTERECTOMY N/A 06/13/2015   Procedure: XI ROBOTIC ASSISTED TOTAL ABDOMINAL LAPAROSCOPIC HYSTERECTOMY;  Surgeon: Janie Morning, MD;  Location: [facility];  Service: Gynecology;  Laterality: N/A;  . SALPINGOOPHORECTOMY Bilateral 06/13/2015   Procedure: SALPINGO OOPHORECTOMY;  Surgeon: Janie Morning, MD;  Location: [facility];  Service: Gynecology;  Laterality: Bilateral;  . STAPENDECTOMY     BILATERAL   Family History  Problem Relation Age of Onset  . Stroke Father   . Hypertension Mother   . Stroke Sister   . Cancer Brother     Esophageal cancer  . Cancer Brother     Lung smoker  . Parkinson's disease Brother   . Stroke Brother    History  Sexual Activity  . Sexual activity:  Yes  . Birth control/ protection: Post-menopausal    Outpatient Encounter Prescriptions as of 03/04/2016  Medication Sig  . aspirin 81 MG tablet Take 81 mg by mouth every morning.   Marland Kitchen BIOTIN PO Take 1 tablet by mouth every morning.  . Cholecalciferol 1000 UNITS capsule Take 1,000 Units by mouth every morning.   . Cyanocobalamin (B-12 PO) Take 1 tablet by mouth daily.   . Nutritional Supplements (PYCNOGENOL) 30 MG CAPS Take 30 mg by mouth every morning.   . Red Yeast Rice Extract (RED YEAST RICE PO) Take 1,200 mg by mouth every morning.   . sodium fluoride-calcium carbonate (FLORICAL) 8.3-364 MG CAPS Take 1 capsule by mouth 2 (two) times daily.   No facility-administered encounter medications on file as of 03/04/2016.     Activities of Daily Living In your present state of health, do you have any difficulty performing the following activities: 03/04/2016 06/13/2015  Hearing? Tempie Donning  Vision? N N  Difficulty concentrating or making decisions? N N  Walking or climbing stairs? N N  Dressing or bathing? N N  Doing errands, shopping? N N  Preparing Food and eating ? N -  Using the Toilet? N -  In the past six months, have you accidently leaked urine? N -  Do you have problems with loss of bowel control? N -  Managing your Medications? N -  Managing your Finances? N -  Housekeeping or managing your Housekeeping? N -  Some recent data might be hidden    Patient Care Team: Jinny Sanders, MD as PCP - General Thelma Comp, OD as Consulting Physician (Optometry) Everitt Amber, MD as Consulting Physician (Obstetrics and Gynecology) Allyn Kenner, MD as Consulting Physician (Dermatology) Bobbye Charleston, MD as Consulting Physician (Obstetrics and Gynecology)    Assessment:     Visual Acuity Screening   Right eye Left eye Both eyes  Without correction: 20/30-1 20/25-1 20/25-1  With correction:     Hearing Screening Comments: Wears bilateral hearing aids   Exercise Activities and Dietary  recommendations Current Exercise Habits: Home exercise routine, Type of exercise: walking, Time (Minutes): 20, Frequency (Times/Week): 5, Weekly Exercise (Minutes/Week): 100, Intensity: Moderate, Exercise limited by: None identified  Goals    . Increase physical activity          Starting 03/04/2016, I will continue to walk at least 15-20 min 5 days per week.       Fall Risk Fall Risk  03/04/2016 03/03/2015 11/24/2012  Falls in the past year? No No No   Depression Screen PHQ 2/9 Scores 03/04/2016 03/03/2015 11/24/2012  PHQ - 2 Score 0 0 0     Cognitive Function MMSE - Mini Mental State Exam 03/04/2016  Orientation to time 5  Orientation to Place 5  Registration 3  Attention/ Calculation 0  Recall 3  Language- name 2 objects 0  Language- repeat 1  Language- follow 3 step command 3  Language- read & follow direction 0  Write a sentence 0  Copy design 0  Total score 20     PLEASE NOTE: A Mini-Cog screen was completed. Maximum score is 20. A value of 0 denotes this part of Folstein MMSE was not completed or the patient failed this part of the Mini-Cog screening.   Mini-Cog Screening Orientation to Time - Max 5 pts Orientation to Place - Max 5 pts Registration - Max 3 pts Recall - Max 3 pts Language Repeat - Max 1 pts Language Follow 3 Step Command - Max 3 pts     Immunization History  Administered Date(s) Administered  . Influenza, Seasonal, Injecte, Preservative Fre 01/14/2012, 11/14/2015  . Influenza,inj,Quad PF,36+ Mos 11/24/2012, 12/23/2013, 11/24/2014  . Pneumococcal Conjugate-13 12/23/2013  . Pneumococcal Polysaccharide-23 03/31/2003, 03/03/2015  . Td 02/04/1973, 02/17/2007  . Zoster 03/24/2007   Screening Tests Health Maintenance  Topic Date Due  . MAMMOGRAM  05/03/2016  . TETANUS/TDAP  02/16/2017  . INFLUENZA VACCINE  Completed  . DEXA SCAN  Completed  . ZOSTAVAX  Completed  . PNA vac Low Risk Adult  Completed      Plan:     I have personally reviewed  and addressed the Medicare Annual Wellness questionnaire and have noted the following in the patient's chart:  A. Medical and social history B. Use of alcohol, tobacco or illicit drugs  C. Current medications and supplements D. Functional ability and status E.  Nutritional status F.  Physical activity G. Advance directives H. List of other physicians I.  Hospitalizations, surgeries, and ER visits in previous 12 months J.  [place] to include hearing, vision, cognitive, depression L. Referrals and appointments - none  In addition, I have reviewed and discussed with patient certain preventive protocols, quality metrics, and best practice recommendations. A written personalized care plan  for preventive services as well as general preventive health recommendations were provided to patient.  See attached scanned questionnaire for additional information.   Signed,   Lindell Noe, MHA, BS, LPN Health Coach

## 2016-03-04 NOTE — Progress Notes (Signed)
PCP notes:   Health maintenance:  No gaps identified. Health maintenance schedule verbally discussed with patient.  Abnormal screenings:   None  Patient concerns:   None  Nurse concerns:  None  Next PCP appt:   03/08/16 @ 1500

## 2016-03-04 NOTE — Progress Notes (Signed)
Medical screening examination/treatment/procedure(s) were performed by registered nurse and as supervising non-physician practitioner, I was immediately available for consultation/collaboration.   Naveed Humphres, NP  

## 2016-03-08 ENCOUNTER — Encounter: Payer: Self-pay | Admitting: Family Medicine

## 2016-03-08 ENCOUNTER — Ambulatory Visit (INDEPENDENT_AMBULATORY_CARE_PROVIDER_SITE_OTHER): Payer: Medicare Other | Admitting: Family Medicine

## 2016-03-08 VITALS — BP 130/70 | HR 81 | Temp 97.8°F | Ht 61.0 in | Wt 130.5 lb

## 2016-03-08 DIAGNOSIS — E7800 Pure hypercholesterolemia, unspecified: Secondary | ICD-10-CM

## 2016-03-08 DIAGNOSIS — I10 Essential (primary) hypertension: Secondary | ICD-10-CM | POA: Diagnosis not present

## 2016-03-08 DIAGNOSIS — M7062 Trochanteric bursitis, left hip: Secondary | ICD-10-CM

## 2016-03-08 DIAGNOSIS — M79602 Pain in left arm: Secondary | ICD-10-CM

## 2016-03-08 DIAGNOSIS — M1812 Unilateral primary osteoarthritis of first carpometacarpal joint, left hand: Secondary | ICD-10-CM | POA: Diagnosis not present

## 2016-03-08 DIAGNOSIS — M79601 Pain in right arm: Secondary | ICD-10-CM

## 2016-03-08 MED ORDER — MELOXICAM 15 MG PO TABS: 15.0000 mg | ORAL_TABLET | Freq: Every day | ORAL | 0 refills | Status: DC

## 2016-03-08 NOTE — Assessment & Plan Note (Signed)
Good control

## 2016-03-08 NOTE — Assessment & Plan Note (Signed)
Not clearly shoulder or neck associated.. Neg spurling and neer's  No sign of ulnar or median nerve issue. ? Secondary to red yeast rice side effect.. Consider trial off.

## 2016-03-08 NOTE — Assessment & Plan Note (Signed)
Good control on no med. 

## 2016-03-08 NOTE — Progress Notes (Signed)
Pre visit review using our clinic review tool, if applicable. No additional management support is needed unless otherwise documented below in the visit note. 

## 2016-03-08 NOTE — Progress Notes (Signed)
Subjective:    Patient ID: [name], female    DOB: Aug 06, 1940, 76 y.o.   MRN: [id number]  HPI  The patient saw Candis Musa, LPN for medicare wellness. Note reviewed in detail and important notes copied below. No gaps identified. Health maintenance schedule verbally discussed with patient.  Abnormal screenings:  None  Patient concerns:  None  Nurse concerns: None  Stage 1A Grade 2 endometrial cancer : S/p robotic assisted total hysterectomy, BSO, sentinel lymph node mapping on 06/13/15. no LVSI, 40% myometrial invasion, negative lymph nodes.  Followed by Dr. Denman George. Surgeon Dr. Skeet Latch. Plans  Follow up in 6 months. No complications.    She does have pain in both arms from shoulder to wrists. Pain in [facility] joint bilaterally.ONgoing for last 3 months  Worse when weather changes  No pain with movements. No weakness , no numbness , no tingling.  No neck pain.  No falls.   Pain in left lateral upper thigh in last few months  No pain with walking. Pain with lying on the area.  ibuprofen 200 mg x 1 helps pain.  Hypertension:   Good control on no med. BP Readings from Last 3 Encounters:  03/08/16 130/70  03/04/16 132/82  07/10/15 130/79  Using medication without problems or lightheadedness:  None Chest pain with exertion:None Edema: none Short of breath: Average home BPs: good Other issues:   Elevated Cholesterol:   Good control on red yeast rice Lab Results  Component Value Date   CHOL 187 03/04/2016   HDL 52.20 03/04/2016   LDLCALC 118 (H) 03/04/2016   LDLDIRECT 148.1 11/17/2012   TRIG 81.0 03/04/2016   CHOLHDL 4 03/04/2016  Using medications without problems: None Muscle aches: None Diet compliance: good Exercise: walking 3-5 days a week. Other complaints:      Social History /Family History/Past Medical History reviewed and updated if needed.  Review of Systems  Constitutional: Negative for fatigue and fever.  HENT: Negative for  congestion.   Eyes: Negative for pain.  Respiratory: Negative for cough and shortness of breath.   Cardiovascular: Negative for chest pain, palpitations and leg swelling.  Gastrointestinal: Negative for abdominal pain.  Genitourinary: Negative for dysuria and vaginal bleeding.  Musculoskeletal: Positive for back pain.  Neurological: Negative for syncope, light-headedness and headaches.  Psychiatric/Behavioral: Negative for dysphoric mood.       Objective:   Physical Exam  Constitutional: Vital signs are normal. She appears well-developed and well-nourished. She is cooperative.  Non-toxic appearance. She does not appear ill. No distress.  HENT:  Head: Normocephalic.  Right Ear: Hearing, tympanic membrane, external ear and ear canal normal.  Left Ear: Hearing, tympanic membrane, external ear and ear canal normal.  Nose: Nose normal.  Eyes: Conjunctivae, EOM and lids are normal. Pupils are equal, round, and reactive to light. Lids are everted and swept, no foreign bodies found.  Neck: Trachea normal and normal range of motion. Neck supple. Carotid bruit is not present. No thyroid mass and no thyromegaly present.  Cardiovascular: Normal rate, regular rhythm, S1 normal, S2 normal, normal heart sounds and intact distal pulses.  Exam reveals no gallop.   No murmur heard. Pulmonary/Chest: Effort normal and breath sounds normal. No respiratory distress. She has no wheezes. She has no rhonchi. She has no rales.  Abdominal: Soft. Normal appearance and bowel sounds are normal. She exhibits no distension, no fluid wave, no abdominal bruit and no mass. There is no hepatosplenomegaly. There is no tenderness. There is no  rebound, no guarding and no CVA tenderness. No hernia.  Musculoskeletal:       Right shoulder: Normal.       Left shoulder: Normal.       Right wrist: Normal.       Left wrist: Normal.       Cervical back: Normal.       Lumbar back: Normal.  ttp over left lateral hip, neg faber' s  and SLR   neg Neer's no pain with shoulder ROM   left CMC swelling and positive grind test  Lymphadenopathy:    She has no cervical adenopathy.    She has no axillary adenopathy.  Neurological: She is alert. She has normal strength. No cranial nerve deficit or sensory deficit.  Skin: Skin is warm, dry and intact. No rash noted.  Psychiatric: Her speech is normal and behavior is normal. Judgment normal. Her mood appears not anxious. Cognition and memory are normal. She does not exhibit a depressed mood.          Assessment & Plan:  The patient's preventative maintenance and recommended screening tests for an annual wellness exam were reviewed in full today. Brought up to date unless services declined.  Counselled on the importance of diet, exercise, and its role in overall health and mortality. The patient's FH and SH was reviewed, including their home life, tobacco status, and drug and alcohol status.   TAH Vaccines: uptodate Colonoscopy: 06/2005,   Plan IFOB. Mammo:  04/2015 , repeat 11/2015 hx of breast cancer DEXA: The patient's last bone density scan on 04/29/2011 showed a T score of -1.4 (osteopenia), not interested in fosamax. Repeat 04/2013: increase in spine and decrease in femur compared to 2013, but no change compared to earlier 2006, plan repeat in 2020. Non smoker.

## 2016-03-08 NOTE — Patient Instructions (Addendum)
Start meloxicam 15 mg daily. Take on full stomach. Do not use with ibuprofen Call if not improving pain in hip, arms and thumb in 2 weeks. Start home PT for trochanteric bursitis.  Can try using CMC joint brace. If arm pain not improving.. Consider trying to hold red yeast rice to see if causing a side effect of arm pain.

## 2016-03-08 NOTE — Assessment & Plan Note (Signed)
NSAID and start home PT. If not improving consider referral for steroid injection.

## 2016-03-08 NOTE — Assessment & Plan Note (Addendum)
Treat with antiinflammatories, brace.. If not improving consider referral to hand specialist.

## 2016-04-02 ENCOUNTER — Other Ambulatory Visit: Payer: Self-pay | Admitting: Family Medicine

## 2016-04-02 NOTE — Telephone Encounter (Signed)
Last office visit 03/08/2016.  Medication not on current med list.  Refill?

## 2016-04-02 NOTE — Telephone Encounter (Signed)
Left message for [name] to return my call.

## 2016-04-02 NOTE — Telephone Encounter (Signed)
Call pt... previosly good control on no med... What are current BPS?

## 2016-04-03 NOTE — Telephone Encounter (Signed)
Spoke with [name].  She states she had never stopped the blood pressure medicine. She states the only time it was stopped was when she went in for her hysterectomy over the summer.  She states she is down to 2 pills and did request the refill.  [name] states she feels like she needs to be on this medication.

## 2016-07-04 ENCOUNTER — Ambulatory Visit (INDEPENDENT_AMBULATORY_CARE_PROVIDER_SITE_OTHER): Payer: Medicare Other | Admitting: Internal Medicine

## 2016-07-04 ENCOUNTER — Encounter: Payer: Self-pay | Admitting: Internal Medicine

## 2016-07-04 VITALS — BP 122/84 | HR 90 | Temp 98.0°F | Wt 127.8 lb

## 2016-07-04 DIAGNOSIS — J301 Allergic rhinitis due to pollen: Secondary | ICD-10-CM | POA: Diagnosis not present

## 2016-07-04 NOTE — Progress Notes (Signed)
HPI  Pt presents to the clinic today with c/o nasal congestion, cough and chest congestion. This started 6 days ago. She is blowing clear mucous out of her nose. The cough is productive of thick mucous. She denies ear pain, runny nose, sore throat or shortness of breath. She denies fever, chills or body aches. She has tried Mucinex without any relief. She has no history of allergies or breathing problems. She has not had sick contacts that she is aware of.   Review of Systems        Past Medical History:  Diagnosis Date  . Breast cancer ([place]) 04/2003   Invasive ductal carcinoma of the right breast  . Complication of anesthesia    "slow to wake up"  . Hard of hearing   . Hypertension   . Osteopenia     Family History  Problem Relation Age of Onset  . Stroke Father   . Hypertension Mother   . Stroke Sister   . Cancer Brother        Esophageal cancer  . Cancer Brother        Lung smoker  . Parkinson's disease Brother   . Stroke Brother     Social History   Social History  . Marital status: Married    Spouse name: N/A  . Number of children: N/A  . Years of education: N/A   Occupational History  . retired Retired   Social History Main Topics  . Smoking status: Never Smoker  . Smokeless tobacco: Never Used  . Alcohol use No  . Drug use: No  . Sexual activity: Yes    Birth control/ protection: Post-menopausal   Other Topics Concern  . Not on file   Social History Narrative   Regular exercise--no, occ goes to [employer]   Diet: healthy, fruits and veggies   No living will, no HCPOA (reviewed 2013)    Allergies  Allergen Reactions  . Morphine And Related     Patient requested not to have morphine. States it is too strong for her.   . Penicillins Rash    Has patient had a PCN reaction causing immediate rash, facial/tongue/throat swelling, SOB or lightheadedness with hypotension: Yes Has patient had a PCN reaction causing severe rash involving mucus membranes or skin  necrosis: Unknown Has patient had a PCN reaction that required hospitalization No Has patient had a PCN reaction occurring within the last 10 years: No If all of the above answers are "NO", then may proceed with Cephalosporin use.     Constitutional: Denies headache, fatigue, fever or abrupt weight changes.  HEENT:  Positive nasal congestion. Denies eye redness, eye pain, pressure behind the eyes, facial pain, ear pain, ringing in the ears, wax buildup, runny nose or sore throat. Respiratory: Positive cough. Denies difficulty breathing or shortness of breath.  Cardiovascular: Denies chest pain, chest tightness, palpitations or swelling in the hands or feet.   No other specific complaints in a complete review of systems (except as listed in HPI above).  Objective:   BP 122/84   Pulse 90   Temp 98 F (36.7 C) (Oral)   Wt 127 lb 12 oz (57.9 kg)   SpO2 96%   BMI 24.14 kg/m  Wt Readings from Last 3 Encounters:  07/04/16 127 lb 12 oz (57.9 kg)  03/08/16 130 lb 8 oz (59.2 kg)  03/04/16 129 lb 8 oz (58.7 kg)     General: Appears her stated age, well developed, well nourished in NAD.  HEENT: Head: normal shape and size, no sinus tenderness noted; Nose: mucosa pink and moist, septum midline; Throat/Mouth: + PND. Teeth present, mucosa pink and moist, no exudate noted, no lesions or ulcerations noted.  Neck: No cervical lymphadenopathy.  Pulmonary/Chest: Normal effort and positive vesicular breath sounds. No respiratory distress. No wheezes, rales or ronchi noted.       Assessment & Plan:   Allergic Rhinitis:  Get some rest and drink plenty of water Start Flonase OTC- use as directed Mucinex 600 mg every 12 hours for 3-5 days  RTC as needed or if symptoms persist.   Webb Silversmith, NP

## 2016-07-04 NOTE — Patient Instructions (Signed)
Allergic Rhinitis Allergic rhinitis is when the mucous membranes in the nose respond to allergens. Allergens are particles in the air that cause your body to have an allergic reaction. This causes you to release allergic antibodies. Through a chain of events, these eventually cause you to release histamine into the blood stream. Although meant to protect the body, it is this release of histamine that causes your discomfort, such as frequent sneezing, congestion, and an itchy, runny nose. What are the causes? Seasonal allergic rhinitis (hay fever) is caused by pollen allergens that may come from grasses, trees, and weeds. Year-round allergic rhinitis (perennial allergic rhinitis) is caused by allergens such as house dust mites, pet dander, and mold spores. What are the signs or symptoms?  Nasal stuffiness (congestion).  Itchy, runny nose with sneezing and tearing of the eyes. How is this diagnosed? Your health care provider can help you determine the allergen or allergens that trigger your symptoms. If you and your health care provider are unable to determine the allergen, skin or blood testing may be used. Your health care provider will diagnose your condition after taking your health history and performing a physical exam. Your health care provider may assess you for other related conditions, such as asthma, pink eye, or an ear infection. How is this treated? Allergic rhinitis does not have a cure, but it can be controlled by:  Medicines that block allergy symptoms. These may include allergy shots, nasal sprays, and oral antihistamines.  Avoiding the allergen. Hay fever may often be treated with antihistamines in pill or nasal spray forms. Antihistamines block the effects of histamine. There are over-the-counter medicines that may help with nasal congestion and swelling around the eyes. Check with your health care provider before taking or giving this medicine. If avoiding the allergen or the  medicine prescribed do not work, there are many new medicines your health care provider can prescribe. Stronger medicine may be used if initial measures are ineffective. Desensitizing injections can be used if medicine and avoidance does not work. Desensitization is when a patient is given ongoing shots until the body becomes less sensitive to the allergen. Make sure you follow up with your health care provider if problems continue. Follow these instructions at home: It is not possible to completely avoid allergens, but you can reduce your symptoms by taking steps to limit your exposure to them. It helps to know exactly what you are allergic to so that you can avoid your specific triggers. Contact a health care provider if:  You have a fever.  You develop a cough that does not stop easily (persistent).  You have shortness of breath.  You start wheezing.  Symptoms interfere with normal daily activities. This information is not intended to replace advice given to you by your health care provider. Make sure you discuss any questions you have with your health care provider. Document Released: 10/16/2000 Document Revised: 09/22/2015 Document Reviewed: 09/28/2012 Elsevier Interactive Patient Education  2017 [employer]  

## 2016-08-05 ENCOUNTER — Other Ambulatory Visit: Payer: Self-pay | Admitting: Family Medicine

## 2016-08-05 DIAGNOSIS — Z1231 Encounter for screening mammogram for malignant neoplasm of breast: Secondary | ICD-10-CM

## 2016-08-21 ENCOUNTER — Ambulatory Visit
Admission: RE | Admit: 2016-08-21 | Discharge: 2016-08-21 | Disposition: A | Discharge status: Home / Self Care | Payer: Medicare Other | Source: Ambulatory Visit | Attending: Family Medicine | Admitting: Family Medicine

## 2016-08-21 DIAGNOSIS — Z1231 Encounter for screening mammogram for malignant neoplasm of breast: Secondary | ICD-10-CM | POA: Diagnosis not present

## 2016-08-22 ENCOUNTER — Encounter: Payer: Self-pay | Admitting: *Deleted

## 2016-09-02 ENCOUNTER — Ambulatory Visit: Payer: Self-pay | Admitting: Primary Care

## 2016-09-30 ENCOUNTER — Other Ambulatory Visit: Payer: Self-pay | Admitting: Family Medicine

## 2017-03-04 ENCOUNTER — Telehealth: Payer: Self-pay | Admitting: Family Medicine

## 2017-03-04 DIAGNOSIS — E7800 Pure hypercholesterolemia, unspecified: Secondary | ICD-10-CM

## 2017-03-04 DIAGNOSIS — M8580 Other specified disorders of bone density and structure, unspecified site: Secondary | ICD-10-CM

## 2017-03-04 NOTE — Telephone Encounter (Signed)
-----   Message from Eustace Pen, LPN sent at 4/40/3474 12:40 PM EST ----- Regarding: Labs 1/29 Lab orders needed. Thank you.  Insurance:  [facility]

## 2017-03-05 ENCOUNTER — Ambulatory Visit (INDEPENDENT_AMBULATORY_CARE_PROVIDER_SITE_OTHER): Payer: Medicare Other

## 2017-03-05 VITALS — BP 124/76 | HR 80 | Temp 97.7°F | Ht 61.0 in | Wt 128.5 lb

## 2017-03-05 DIAGNOSIS — E7800 Pure hypercholesterolemia, unspecified: Secondary | ICD-10-CM

## 2017-03-05 DIAGNOSIS — Z0000 Encounter for general adult medical examination without abnormal findings: Secondary | ICD-10-CM

## 2017-03-05 DIAGNOSIS — M8580 Other specified disorders of bone density and structure, unspecified site: Secondary | ICD-10-CM | POA: Diagnosis not present

## 2017-03-05 LAB — COMPREHENSIVE METABOLIC PANEL
ALBUMIN: 4 g/dL (ref 3.5–5.2)
ALT: 19 U/L (ref 0–35)
AST: 21 U/L (ref 0–37)
Alkaline Phosphatase: 45 U/L (ref 39–117)
BILIRUBIN TOTAL: 0.8 mg/dL (ref 0.2–1.2)
BUN: 20 mg/dL (ref 6–23)
CALCIUM: 9.3 mg/dL (ref 8.4–10.5)
CO2: 27 mEq/L (ref 19–32)
CREATININE: 0.9 mg/dL (ref 0.40–1.20)
Chloride: 103 mEq/L (ref 96–112)
GFR: 64.21 mL/min (ref >60.00)
Glucose, Bld: 98 mg/dL (ref 70–99)
Potassium: 3.9 mEq/L (ref 3.5–5.1)
SODIUM: 140 meq/L (ref 135–145)
Total Protein: 7 g/dL (ref 6.0–8.3)

## 2017-03-05 LAB — LIPID PANEL
CHOLESTEROL: 198 mg/dL (ref 0–200)
HDL: 53.1 mg/dL (ref >39.00)
LDL Cholesterol: 124 mg/dL — ABNORMAL HIGH (ref 0–99)
NonHDL: 144.67
TRIGLYCERIDES: 102 mg/dL (ref 0.0–149.0)
Total CHOL/HDL Ratio: 4
VLDL: 20.4 mg/dL (ref 0.0–40.0)

## 2017-03-05 LAB — VITAMIN D 25 HYDROXY (VIT D DEFICIENCY, FRACTURES): VITD: 49.2 ng/mL (ref 30.00–100.00)

## 2017-03-05 NOTE — Patient Instructions (Signed)
[name] , Thank you for taking time to come for your Medicare Wellness Visit. I appreciate your ongoing commitment to your health goals. Please review the following plan we discussed and let me know if I can assist you in the future.   These are the goals we discussed: Goals    . Increase physical activity     Starting 03/05/2017, I will continue to walk at least 15 minutes 5 days per week.        This is a list of the screening recommended for you and due dates:  Health Maintenance  Topic Date Due  . Tetanus Vaccine  03/05/2018*  . Mammogram  08/21/2017  . Flu Shot  Completed  . DEXA scan (bone density measurement)  Completed  . Pneumonia vaccines  Completed  *Topic was postponed. The date shown is not the original due date.   Preventive Care for Adults  A healthy lifestyle and preventive care can promote health and wellness. Preventive health guidelines for adults include the following key practices.  . A routine yearly physical is a good way to check with your health care provider about your health and preventive screening. It is a chance to share any concerns and updates on your health and to receive a thorough exam.  . Visit your dentist for a routine exam and preventive care every 6 months. Brush your teeth twice a day and floss once a day. Good oral hygiene prevents tooth decay and gum disease.  . The frequency of eye exams is based on your age, health, family medical history, use  of contact lenses, and other factors. Follow your health care provider's recommendations for frequency of eye exams.  . Eat a healthy diet. Foods like vegetables, fruits, whole grains, low-fat dairy products, and lean protein foods contain the nutrients you need without too many calories. Decrease your intake of foods high in solid fats, added sugars, and salt. Eat the right amount of calories for you. Get information about a proper diet from your health care provider, if necessary.  . Regular  physical exercise is one of the most important things you can do for your health. Most adults should get at least 150 minutes of moderate-intensity exercise (any activity that increases your heart rate and causes you to sweat) each week. In addition, most adults need muscle-strengthening exercises on 2 or more days a week.  Silver Sneakers may be a benefit available to you. To determine eligibility, you may visit the website: www.silversneakers.com or contact program at (413) 728-7754 Mon-Fri between 8AM-8PM.   . Maintain a healthy weight. The body mass index (BMI) is a screening tool to identify possible weight problems. It provides an estimate of body fat based on height and weight. Your health care provider can find your BMI and can help you achieve or maintain a healthy weight.   For adults 20 years and older: ? A BMI below 18.5 is considered underweight. ? A BMI of 18.5 to 24.9 is normal. ? A BMI of 25 to 29.9 is considered overweight. ? A BMI of 30 and above is considered obese.   . Maintain normal blood lipids and cholesterol levels by exercising and minimizing your intake of saturated fat. Eat a balanced diet with plenty of fruit and vegetables. Blood tests for lipids and cholesterol should begin at age 56 and be repeated every 5 years. If your lipid or cholesterol levels are high, you are over 50, or you are at high risk for heart disease,  you may need your cholesterol levels checked more frequently. Ongoing high lipid and cholesterol levels should be treated with medicines if diet and exercise are not working.  . If you smoke, find out from your health care provider how to quit. If you do not use tobacco, please do not start.  . If you choose to drink alcohol, please do not consume more than 2 drinks per day. One drink is considered to be 12 ounces (355 mL) of beer, 5 ounces (148 mL) of wine, or 1.5 ounces (44 mL) of liquor.  . If you are 55-56 years old, ask your health care provider if  you should take aspirin to prevent strokes.  . Use sunscreen. Apply sunscreen liberally and repeatedly throughout the day. You should seek shade when your shadow is shorter than you. Protect yourself by wearing long sleeves, pants, a wide-brimmed hat, and sunglasses year round, whenever you are outdoors.  . Once a month, do a whole body skin exam, using a mirror to look at the skin on your back. Tell your health care provider of new moles, moles that have irregular borders, moles that are larger than a pencil eraser, or moles that have changed in shape or color.

## 2017-03-05 NOTE — Progress Notes (Signed)
PCP notes:   Health maintenance:  Tetanus vaccine - postponed/insurance  Abnormal screenings:   None  Patient concerns:   None  Nurse concerns:  None  Next PCP appt:   03/11/17 @ 1400

## 2017-03-05 NOTE — Progress Notes (Signed)
I reviewed health advisor's note, was available for consultation, and agree with documentation and plan.   Signed,  Lattie Riege T. Bernece Gall, MD  

## 2017-03-05 NOTE — Progress Notes (Signed)
Pre visit review using our clinic review tool, if applicable. No additional management support is needed unless otherwise documented below in the visit note. 

## 2017-03-05 NOTE — Progress Notes (Signed)
Subjective:   [name] is a 77 y.o. female who presents for Medicare Annual (Subsequent) preventive examination.  Review of Systems:  N/A Cardiac Risk Factors include: advanced age (>25men, >68 women);dyslipidemia;hypertension     Objective:     Vitals: BP 124/76 (BP Location: Right Arm, Patient Position: Sitting, Cuff Size: Normal)   Pulse 80   Temp 97.7 F (36.5 C) (Oral)   Ht 5\' 1"  (1.549 m) Comment: no shoes  Wt 128 lb 8 oz (58.3 kg)   SpO2 94%   BMI 24.28 kg/m   Body mass index is 24.28 kg/m.  Advanced Directives 03/05/2017 03/04/2016 06/13/2015 06/09/2015  Does Patient Have a Medical Advance Directive? No Yes No No  Type of Advance Directive - [place] in Chart? - No - copy requested - -  Would patient like information on creating a medical advance directive? No - Patient declined - No - patient declined information No - patient declined information    Tobacco Social History   Tobacco Use  Smoking Status Never Smoker  Smokeless Tobacco Never Used     Counseling given: No   Clinical Intake:     Pain : No/denies pain Pain Score: 0-No pain     Nutritional Status: BMI of 19-24  Normal Nutritional Risks: None Diabetes: No  How often do you need to have someone help you when you read instructions, pamphlets, or other written materials from your doctor or pharmacy?: 1 - Never What is the last grade level you completed in school?: 12th grade  Interpreter Needed?: No  Comments: pt lives with spouse Information entered by :: LPinson, LPN  Past Medical History:  Diagnosis Date  . Breast cancer ([place]) 04/2003   Invasive ductal carcinoma of the right breast  . Complication of anesthesia    "slow to wake up"  . Hard of hearing   . Hypertension   . Osteopenia    Past Surgical History:  Procedure Laterality Date  . BREAST SURGERY  2005   LUMPECTOMY  . EPISIOTOMY    . HYSTEROSCOPY W/D&C      . LYMPH NODE BIOPSY N/A 06/13/2015   Procedure: SENTINEL  LYMPH NODE BIOPSY;  Surgeon: Janie Morning, MD;  Location: [facility];  Service: Gynecology;  Laterality: N/A;  . PROCTOSCOPY    . PROCTOSOMY REPAIR    . ROBOTIC ASSISTED TOTAL HYSTERECTOMY N/A 06/13/2015   Procedure: XI ROBOTIC ASSISTED TOTAL ABDOMINAL LAPAROSCOPIC HYSTERECTOMY;  Surgeon: Janie Morning, MD;  Location: [facility];  Service: Gynecology;  Laterality: N/A;  . SALPINGOOPHORECTOMY Bilateral 06/13/2015   Procedure: SALPINGO OOPHORECTOMY;  Surgeon: Janie Morning, MD;  Location: [facility];  Service: Gynecology;  Laterality: Bilateral;  . STAPENDECTOMY     BILATERAL   Family History  Problem Relation Age of Onset  . Stroke Father   . Hypertension Mother   . Stroke Sister   . Cancer Brother        Esophageal cancer  . Cancer Brother        Lung smoker  . Parkinson's disease Brother   . Stroke Brother    Social History   Socioeconomic History  . Marital status: Married    Spouse name: None  . Number of children: None  . Years of education: None  . Highest education level: None  Social Needs  . Financial resource strain: None  . Food insecurity - worry: None  . Food insecurity - inability: None  .  Transportation needs - medical: None  . Transportation needs - non-medical: None  Occupational History  . Occupation: retired    Fish farm manager: Retired  Immunologist  . Smoking status: Never Smoker  . Smokeless tobacco: Never Used  Substance and Sexual Activity  . Alcohol use: No  . Drug use: No  . Sexual activity: Yes    Birth control/protection: Post-menopausal  Other Topics Concern  . None  Social History Narrative   Regular exercise--no, occ goes to [employer]   Diet: healthy, fruits and veggies   No living will, no HCPOA (reviewed 2013)    Outpatient Encounter Medications as of 03/05/2017  Medication Sig  . aspirin 81 MG tablet Take 81 mg by mouth every morning.   Marland Kitchen BIOTIN PO Take 1 tablet by mouth every morning.  .  Cholecalciferol 1000 UNITS capsule Take 1,000 Units by mouth every morning.   . Cyanocobalamin (B-12 PO) Take 1 tablet by mouth daily.   . Flaxseed, Linseed, (FLAX SEED OIL) 1000 MG CAPS Take 1 capsule by mouth daily.  Marland Kitchen losartan-hydrochlorothiazide (HYZAAR) 50-12.5 MG tablet TAKE 1 TABLET BY MOUTH DAILY  . meloxicam (MOBIC) 15 MG tablet Take 1 tablet (15 mg total) by mouth daily.  . Nutritional Supplements (PYCNOGENOL) 30 MG CAPS Take 30 mg by mouth every morning.   . Red Yeast Rice Extract (RED YEAST RICE PO) Take 1,200 mg by mouth every morning.   . sodium fluoride-calcium carbonate (FLORICAL) 8.3-364 MG CAPS Take 1 capsule by mouth 2 (two) times daily.   No facility-administered encounter medications on file as of 03/05/2017.     Activities of Daily Living In your present state of health, do you have any difficulty performing the following activities: 03/05/2017  Hearing? Y  Vision? N  Difficulty concentrating or making decisions? N  Walking or climbing stairs? N  Dressing or bathing? N  Doing errands, shopping? N  Preparing Food and eating ? N  Using the Toilet? N  In the past six months, have you accidently leaked urine? N  Do you have problems with loss of bowel control? N  Managing your Medications? N  Managing your Finances? N  Housekeeping or managing your Housekeeping? N  Some recent data might be hidden    Patient Care Team: Jinny Sanders, MD as PCP - General Thelma Comp, [place] as Consulting Physician (Optometry) Everitt Amber, MD as Consulting Physician (Obstetrics and Gynecology) Allyn Kenner, MD as Consulting Physician (Dermatology) Bobbye Charleston, MD as Consulting Physician (Obstetrics and Gynecology)    Assessment:   This is a routine wellness examination for [name].   Visual Acuity Screening   Right eye Left eye Both eyes  Without correction: 20/30 20/30 20/25   With correction:     Hearing Screening Comments: Bilateral hearing aids    Exercise  Activities and Dietary recommendations Current Exercise Habits: Home exercise routine, Type of exercise: walking, Time (Minutes): 15, Frequency (Times/Week): 7, Weekly Exercise (Minutes/Week): 105, Intensity: Mild, Exercise limited by: None identified  Goals    . Increase physical activity     Starting 03/05/2017, I will continue to walk at least 15 minutes 5 days per week.        Fall Risk Fall Risk  03/05/2017 03/04/2016 03/03/2015 11/24/2012  Falls in the past year? No No No No   Depression Screen PHQ 2/9 Scores 03/05/2017 03/04/2016 03/03/2015 11/24/2012  PHQ - 2 Score 0 0 0 0  PHQ- 9 Score 0 - - -     Cognitive Function  MMSE - Mini Mental State Exam 03/05/2017 03/04/2016  Orientation to time 5 5  Orientation to Place 5 5  Registration 3 3  Attention/ Calculation 0 0  Recall 3 3  Language- name 2 objects 0 0  Language- repeat 1 1  Language- follow 3 step command 3 3  Language- read & follow direction 0 0  Write a sentence 0 0  Copy design 0 0  Total score 20 20     PLEASE NOTE: A Mini-Cog screen was completed. Maximum score is 20. A value of 0 denotes this part of Folstein MMSE was not completed or the patient failed this part of the Mini-Cog screening.   Mini-Cog Screening Orientation to Time - Max 5 pts Orientation to Place - Max 5 pts Registration - Max 3 pts Recall - Max 3 pts Language Repeat - Max 1 pts Language Follow 3 Step Command - Max 3 pts     Immunization History  Administered Date(s) Administered  . Influenza, High Dose Seasonal PF 11/20/2016  . Influenza, Seasonal, Injecte, Preservative Fre 01/14/2012, 11/14/2015  . Influenza,inj,Quad PF,6+ Mos 11/24/2012, 12/23/2013, 11/24/2014  . Pneumococcal Conjugate-13 12/23/2013  . Pneumococcal Polysaccharide-23 03/31/2003, 03/03/2015  . Td 02/04/1973, 02/17/2007  . Zoster 03/24/2007    Screening Tests Health Maintenance  Topic Date Due  . TETANUS/TDAP  03/05/2018 (Originally 02/16/2017)  . MAMMOGRAM   08/21/2017  . INFLUENZA VACCINE  Completed  . DEXA SCAN  Completed  . PNA vac Low Risk Adult  Completed         Plan:     I have personally reviewed, addressed, and noted the following in the patient's chart:  A. Medical and social history B. Use of alcohol, tobacco or illicit drugs  C. Current medications and supplements D. Functional ability and status E.  Nutritional status F.  Physical activity G. Advance directives H. List of other physicians I.  Hospitalizations, surgeries, and ER visits in previous 12 months J.  [place] to include hearing, vision, cognitive, depression L. Referrals and appointments - none  In addition, I have reviewed and discussed with patient certain preventive protocols, quality metrics, and best practice recommendations. A written personalized care plan for preventive services as well as general preventive health recommendations were provided to patient.  See attached scanned questionnaire for additional information.   Signed,   Lindell Noe, MHA, BS, LPN Health Coach

## 2017-03-11 ENCOUNTER — Ambulatory Visit (INDEPENDENT_AMBULATORY_CARE_PROVIDER_SITE_OTHER): Payer: Medicare Other | Admitting: Family Medicine

## 2017-03-11 ENCOUNTER — Encounter: Payer: Self-pay | Admitting: Family Medicine

## 2017-03-11 ENCOUNTER — Other Ambulatory Visit: Payer: Self-pay

## 2017-03-11 VITALS — BP 130/80 | HR 82 | Temp 97.4°F | Ht 61.0 in | Wt 130.8 lb

## 2017-03-11 DIAGNOSIS — Z1211 Encounter for screening for malignant neoplasm of colon: Secondary | ICD-10-CM | POA: Diagnosis not present

## 2017-03-11 DIAGNOSIS — Z0000 Encounter for general adult medical examination without abnormal findings: Secondary | ICD-10-CM | POA: Diagnosis not present

## 2017-03-11 DIAGNOSIS — M8580 Other specified disorders of bone density and structure, unspecified site: Secondary | ICD-10-CM

## 2017-03-11 DIAGNOSIS — I10 Essential (primary) hypertension: Secondary | ICD-10-CM | POA: Diagnosis not present

## 2017-03-11 DIAGNOSIS — E7800 Pure hypercholesterolemia, unspecified: Secondary | ICD-10-CM

## 2017-03-11 NOTE — Patient Instructions (Addendum)
Try to take red yeast rice 2 tab 600 mg twice daily.  Decrease animal fats and increase exercise.  Please stop at the lab to pick up stool cards.

## 2017-03-11 NOTE — Assessment & Plan Note (Signed)
On red yeast rice of and on, flaxseed  28 % 10 year risk.. Pt remains not interested in prescription statin.

## 2017-03-11 NOTE — Assessment & Plan Note (Signed)
Well controlled. Continue current medication.  

## 2017-03-11 NOTE — Progress Notes (Signed)
Subjective:    Patient ID: [name], female    DOB: 1940/04/15, 77 y.o.   MRN: [id number]  HPI The patient presents for  complete physical and review of chronic health problems.   The patient saw Candis Musa, LPN for medicare wellness. Note reviewed in detail and important notes copied below. Abnormal screenings:  None Patient concerns:  None Nurse concerns: None  Today 03/11/17  Hypertension:    Good control on losartan HCTZ BP Readings from Last 3 Encounters:  03/11/17 130/80  03/05/17 124/76  07/04/16 122/84  Using medication without problems or lightheadedness:  none Chest pain with exertion: none Edema:none Short of breath:none Average home HYQ:MVHQ control at home Other issues:  Elevated Cholesterol:  On occ red yeast rice of and on, flaxseed  28 % 10 year risk.. Pt remains not interested in prescription statin. Lab Results  Component Value Date   CHOL 198 03/05/2017   HDL 53.10 03/05/2017   LDLCALC 124 (H) 03/05/2017   LDLDIRECT 148.1 11/17/2012   TRIG 102.0 03/05/2017   CHOLHDL 4 03/05/2017  Using medications without problems: Muscle aches:  Diet compliance: good  Exercise: walking 5 days a week Other complaints:  Stage 1A Grade 2 endometrial cancer : S/p robotic assisted total hysterectomy, BSO, sentinel lymph node mappingon 06/13/15. noLVSI, 40% myometrial invasion, negativelymph nodes.  Followed by Dr. Denman George. Surgeon Dr. Skeet Latch. No complications.  Social History /Family History/Past Medical History reviewed in detail and updated in EMR if needed. Blood pressure 130/80, pulse 82, temperature (!) 97.4 F (36.3 C), temperature source Oral, height 5\' 1"  (1.549 m), weight 130 lb 12 oz (59.3 kg).   Review of Systems  Constitutional: Negative for fatigue and fever.  HENT: Negative for congestion.   Eyes: Negative for pain.  Respiratory: Negative for cough and shortness of breath.   Cardiovascular: Negative for chest pain,  palpitations and leg swelling.  Gastrointestinal: Negative for abdominal pain.  Genitourinary: Negative for dysuria and vaginal bleeding.  Musculoskeletal: Negative for back pain.  Neurological: Negative for syncope, light-headedness and headaches.  Psychiatric/Behavioral: Negative for dysphoric mood.       Objective:   Physical Exam  Constitutional: Vital signs are normal. She appears well-developed and well-nourished. She is cooperative.  Non-toxic appearance. She does not appear ill. No distress.  HENT:  Head: Normocephalic.  Right Ear: Hearing, tympanic membrane, external ear and ear canal normal.  Left Ear: Hearing, tympanic membrane, external ear and ear canal normal.  Nose: Nose normal.  Eyes: Conjunctivae, EOM and lids are normal. Pupils are equal, round, and reactive to light. Lids are everted and swept, no foreign bodies found.  Neck: Trachea normal and normal range of motion. Neck supple. Carotid bruit is not present. No thyroid mass and no thyromegaly present.  Cardiovascular: Normal rate, regular rhythm, S1 normal, S2 normal, normal heart sounds and intact distal pulses. Exam reveals no gallop.  No murmur heard. Pulmonary/Chest: Effort normal and breath sounds normal. No respiratory distress. She has no wheezes. She has no rhonchi. She has no rales.  Abdominal: Soft. Normal appearance and bowel sounds are normal. She exhibits no distension, no fluid wave, no abdominal bruit and no mass. There is no hepatosplenomegaly. There is no tenderness. There is no rebound, no guarding and no CVA tenderness. No hernia.  Lymphadenopathy:    She has no cervical adenopathy.    She has no axillary adenopathy.  Neurological: She is alert. She has normal strength. No cranial nerve deficit or sensory deficit.  Skin: Skin is warm, dry and intact. No rash noted.  Psychiatric: Her speech is normal and behavior is normal. Judgment normal. Her mood appears not anxious. Cognition and memory are  normal. She does not exhibit a depressed mood.          Assessment & Plan:  The patient's preventative maintenance and recommended screening tests for an annual wellness exam were reviewed in full today. Brought up to date unless services declined.  Counselled on the importance of diet, exercise, and its role in overall health and mortality. The patient's FH and SH was reviewed, including their home life, tobacco status, and drug and alcohol status.   TAH Vaccines: uptodate Colonoscopy: 06/2005,   Plan IFOB yearly, nml 02/2016 Mammo:  08/2016 hx of breast cancer DEXA: The patient's last bone density scan on 04/29/2011 showed a T score of -1.4 (osteopenia), not interested in fosamax. Repeat 04/2013: increase in spine and decrease in femur compared to 2013, but no change compared to earlier 2006, plan repeat in 2020. Non smoker.

## 2017-03-25 ENCOUNTER — Other Ambulatory Visit: Payer: Self-pay | Admitting: *Deleted

## 2017-03-25 MED ORDER — MELOXICAM 15 MG PO TABS: 15.0000 mg | ORAL_TABLET | Freq: Every day | ORAL | 0 refills | Status: DC

## 2017-03-25 MED ORDER — LOSARTAN POTASSIUM-HCTZ 50-12.5 MG PO TABS: 1.0000 | ORAL_TABLET | Freq: Every day | ORAL | 1 refills | Status: DC

## 2017-03-25 NOTE — Telephone Encounter (Signed)
Last office visit 03/11/2017.  Last refilled 03/08/2016 for #30 with no refills.  Ok to refill?

## 2017-07-08 ENCOUNTER — Other Ambulatory Visit: Payer: Self-pay | Admitting: Family Medicine

## 2017-08-28 DIAGNOSIS — H903 Sensorineural hearing loss, bilateral: Secondary | ICD-10-CM | POA: Diagnosis not present

## 2017-08-28 DIAGNOSIS — H8023 Cochlear otosclerosis, bilateral: Secondary | ICD-10-CM | POA: Diagnosis not present

## 2017-08-28 DIAGNOSIS — H8003 Otosclerosis involving oval window, nonobliterative, bilateral: Secondary | ICD-10-CM | POA: Diagnosis not present

## 2017-08-28 DIAGNOSIS — H6123 Impacted cerumen, bilateral: Secondary | ICD-10-CM | POA: Diagnosis not present

## 2017-09-18 ENCOUNTER — Other Ambulatory Visit: Payer: Self-pay | Admitting: Family Medicine

## 2017-09-18 DIAGNOSIS — Z1231 Encounter for screening mammogram for malignant neoplasm of breast: Secondary | ICD-10-CM

## 2017-10-09 ENCOUNTER — Other Ambulatory Visit: Payer: Self-pay | Admitting: Family Medicine

## 2017-10-17 ENCOUNTER — Ambulatory Visit: Payer: Self-pay

## 2017-10-24 ENCOUNTER — Ambulatory Visit
Admission: RE | Admit: 2017-10-24 | Discharge: 2017-10-24 | Disposition: A | Discharge status: Home / Self Care | Payer: Medicare Other | Source: Ambulatory Visit | Attending: Family Medicine | Admitting: Family Medicine

## 2017-10-24 DIAGNOSIS — Z1231 Encounter for screening mammogram for malignant neoplasm of breast: Secondary | ICD-10-CM | POA: Diagnosis not present

## 2017-10-24 IMAGING — MG DIGITAL SCREENING BILATERAL MAMMOGRAM WITH TOMO AND CAD
8 series · 9 of 24 positions shown · non-contrast
Comparison: Previous exam(s).

CLINICAL DATA: Screening.

EXAM:
DIGITAL SCREENING BILATERAL MAMMOGRAM WITH TOMO AND CAD

[R CC synth-2D]
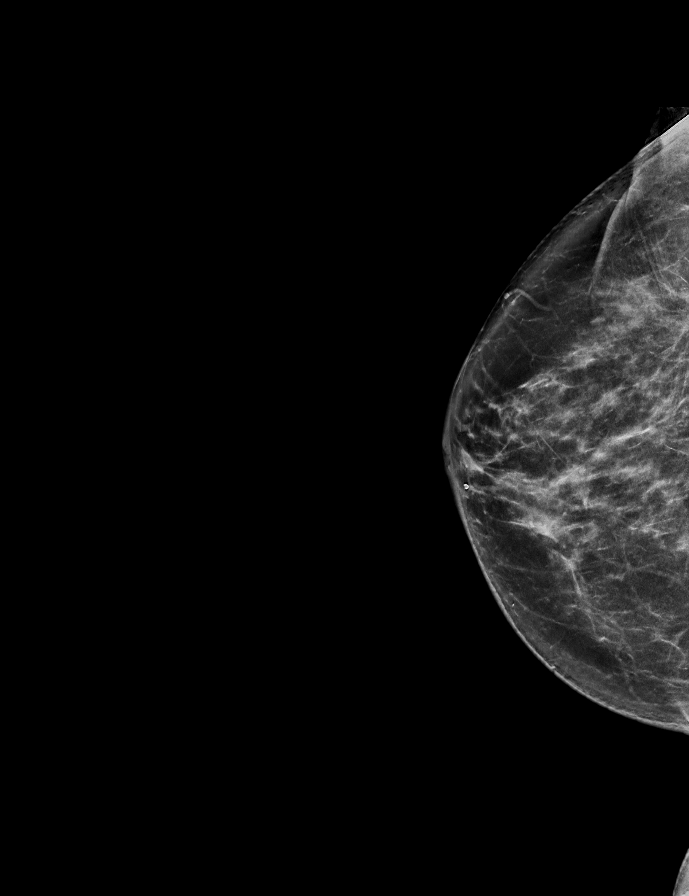

[R MLO synth-2D]
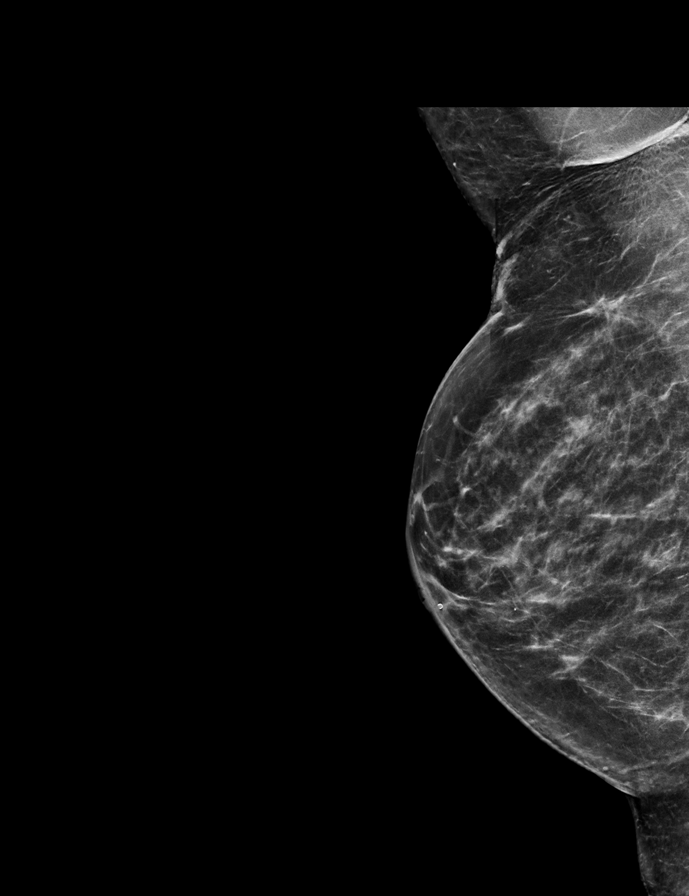

[L MLO synth-2D]
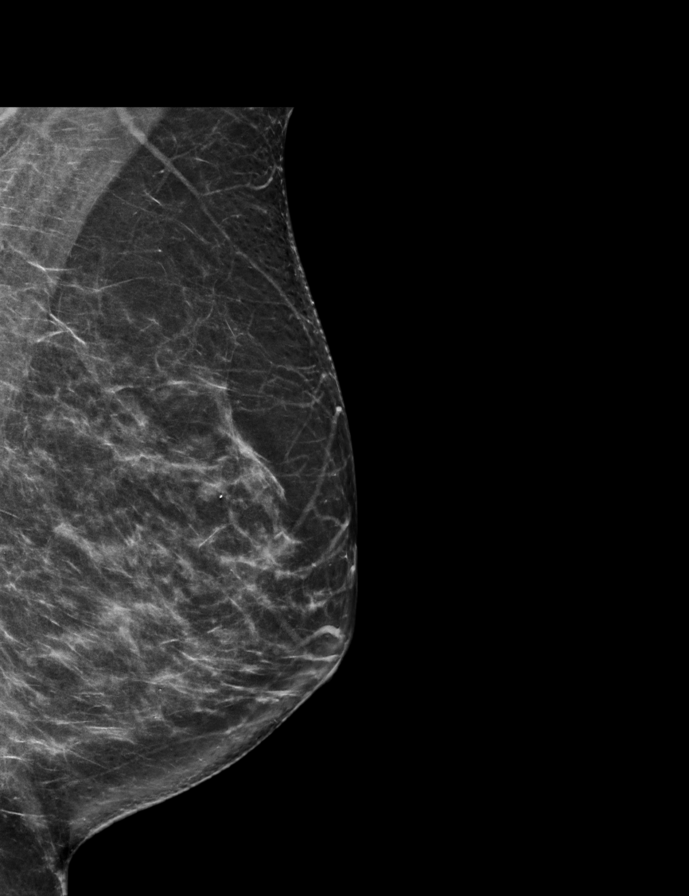

[L CC synth-2D]
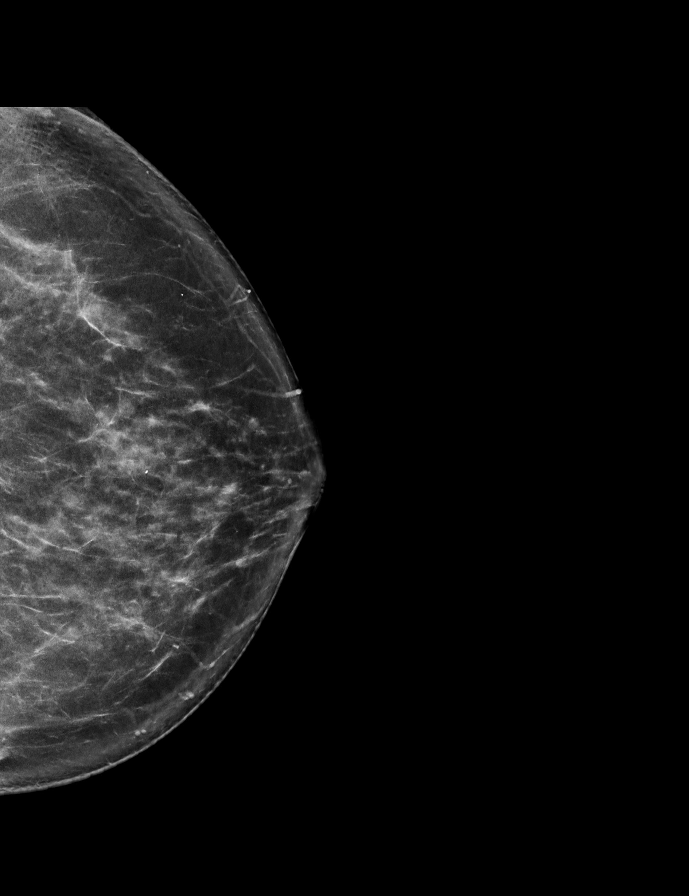

[R CC tomo · 2 of 70 frames shown]
[frame 23/70]
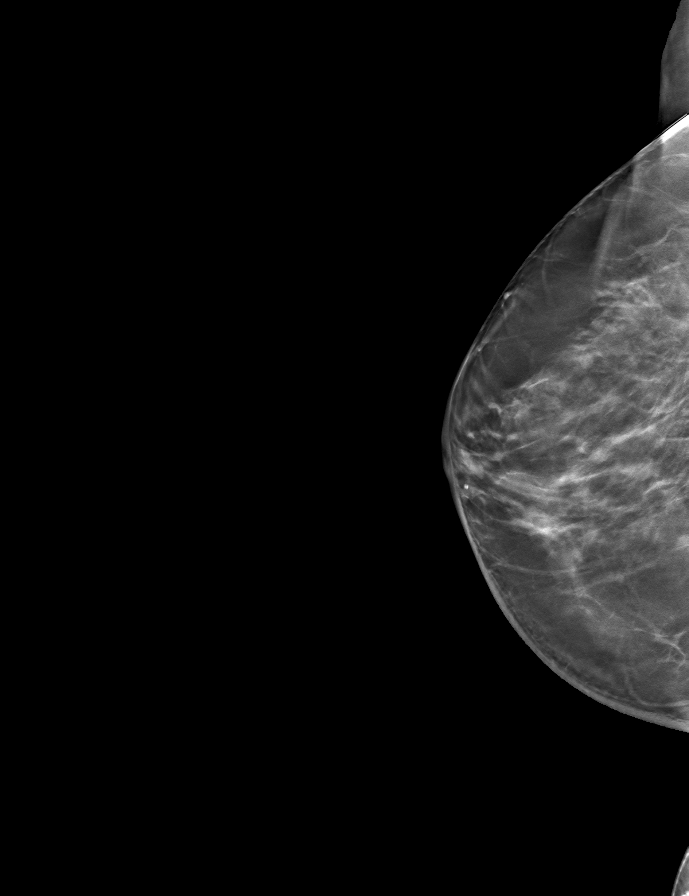
[frame 35/70]
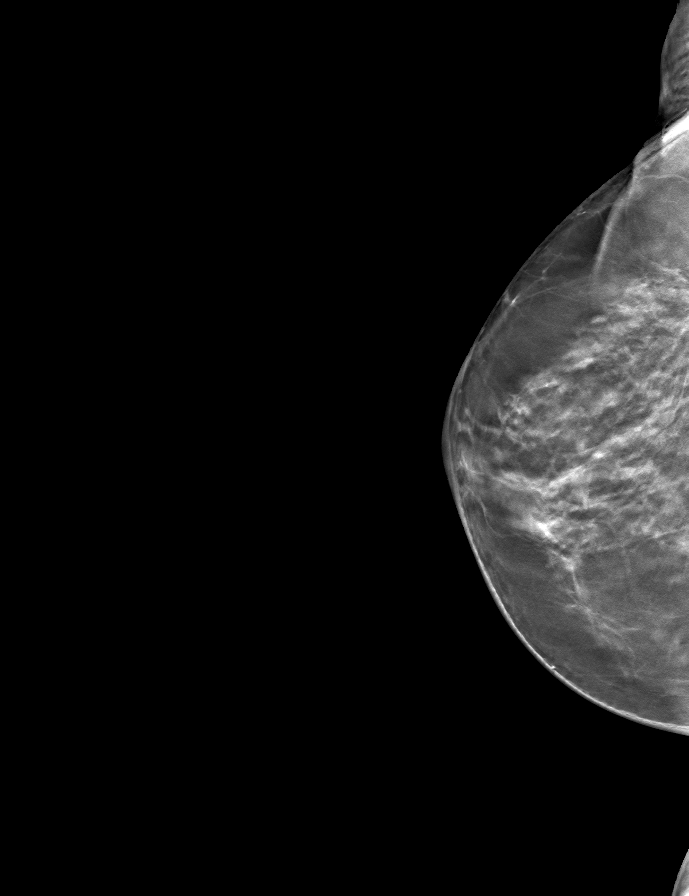

[L MLO tomo · tomo slice 37/73.0]
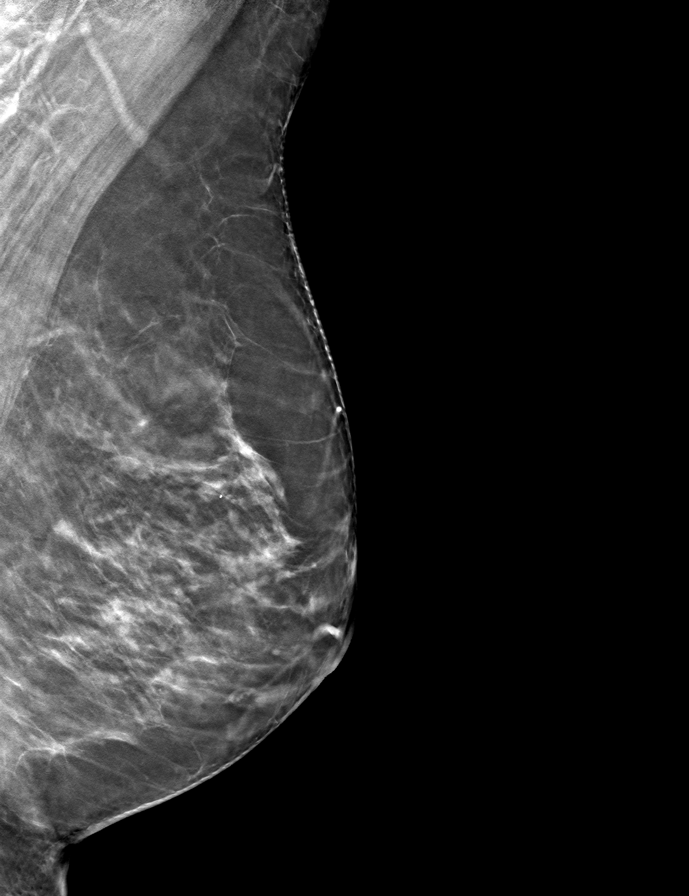

[L CC tomo · tomo slice 40/79.0]
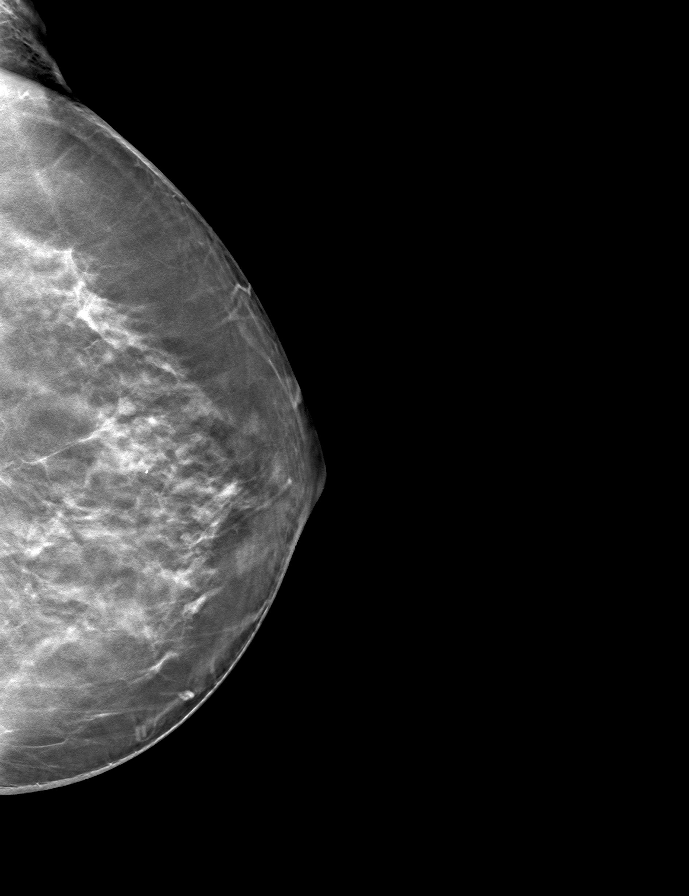

[R MLO tomo · tomo slice 35/68.0]
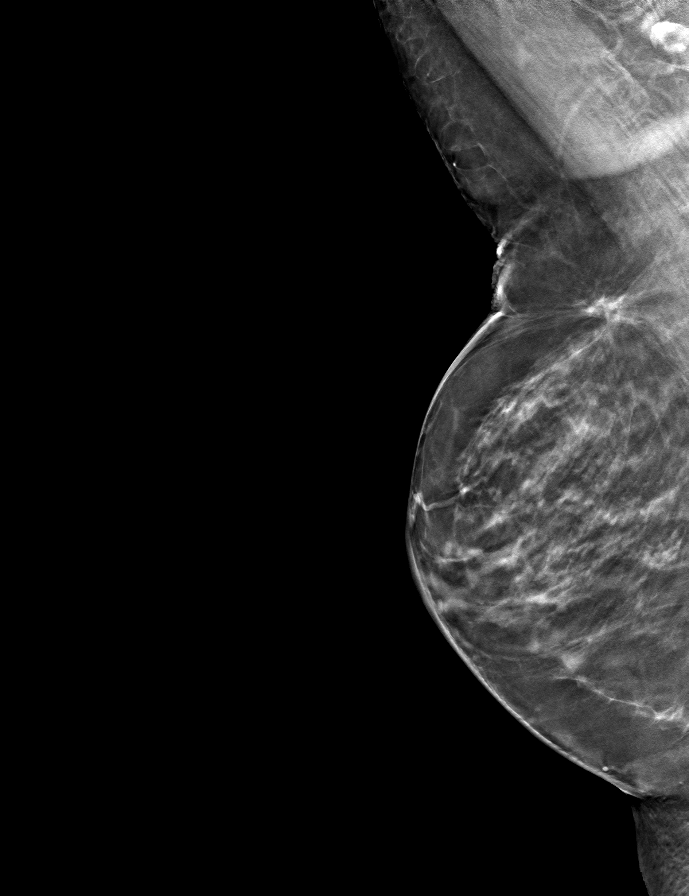

[9 of 24 positions shown; findings below may reference images not displayed]

ACR Breast Density Category c: The breast tissue is heterogeneously
dense, which may obscure small masses.
FINDINGS: There are no findings suspicious for malignancy. Images were
processed with CAD.
IMPRESSION: No mammographic evidence of malignancy. A result letter of this
screening mammogram will be mailed directly to the patient.

RECOMMENDATION:
Screening mammogram in one year. (Code:[5V])

BI-RADS CATEGORY  1: Negative.

## 2017-12-18 ENCOUNTER — Ambulatory Visit (INDEPENDENT_AMBULATORY_CARE_PROVIDER_SITE_OTHER): Payer: Medicare Other

## 2017-12-18 DIAGNOSIS — Z23 Encounter for immunization: Secondary | ICD-10-CM | POA: Diagnosis not present

## 2018-01-08 ENCOUNTER — Other Ambulatory Visit: Payer: Self-pay | Admitting: *Deleted

## 2018-01-08 MED ORDER — LOSARTAN POTASSIUM-HCTZ 50-12.5 MG PO TABS: 1.0000 | ORAL_TABLET | Freq: Every day | ORAL | 0 refills | Status: DC

## 2018-03-10 ENCOUNTER — Telehealth: Payer: Self-pay | Admitting: Family Medicine

## 2018-03-10 DIAGNOSIS — M8580 Other specified disorders of bone density and structure, unspecified site: Secondary | ICD-10-CM

## 2018-03-10 DIAGNOSIS — E7800 Pure hypercholesterolemia, unspecified: Secondary | ICD-10-CM

## 2018-03-10 NOTE — Telephone Encounter (Signed)
m °

## 2018-03-10 NOTE — Telephone Encounter (Signed)
-----   Message from Eustace Pen, LPN sent at 07/17/2447  3:34 PM EST ----- Regarding: Labs 2/5 Lab orders needed. Thank you.

## 2018-03-11 ENCOUNTER — Telehealth: Payer: Self-pay | Admitting: Family Medicine

## 2018-03-11 ENCOUNTER — Ambulatory Visit: Payer: Self-pay

## 2018-03-11 ENCOUNTER — Other Ambulatory Visit (INDEPENDENT_AMBULATORY_CARE_PROVIDER_SITE_OTHER): Payer: Medicare Other

## 2018-03-11 DIAGNOSIS — E7800 Pure hypercholesterolemia, unspecified: Secondary | ICD-10-CM | POA: Diagnosis not present

## 2018-03-11 DIAGNOSIS — M8580 Other specified disorders of bone density and structure, unspecified site: Secondary | ICD-10-CM

## 2018-03-11 LAB — COMPREHENSIVE METABOLIC PANEL
ALK PHOS: 48 U/L (ref 39–117)
ALT: 14 U/L (ref 0–35)
AST: 16 U/L (ref 0–37)
Albumin: 4.2 g/dL (ref 3.5–5.2)
BILIRUBIN TOTAL: 0.7 mg/dL (ref 0.2–1.2)
BUN: 22 mg/dL (ref 6–23)
CALCIUM: 9.7 mg/dL (ref 8.4–10.5)
CHLORIDE: 105 meq/L (ref 96–112)
CO2: 26 meq/L (ref 19–32)
Creatinine, Ser: 0.98 mg/dL (ref 0.40–1.20)
GFR: 54.62 mL/min — AB (ref >60.00)
Glucose, Bld: 91 mg/dL (ref 70–99)
Potassium: 4.2 mEq/L (ref 3.5–5.1)
Sodium: 141 mEq/L (ref 135–145)
Total Protein: 6.8 g/dL (ref 6.0–8.3)

## 2018-03-11 LAB — LIPID PANEL
CHOLESTEROL: 245 mg/dL — AB (ref 0–200)
HDL: 49.3 mg/dL (ref >39.00)
LDL Cholesterol: 173 mg/dL — ABNORMAL HIGH (ref 0–99)
NonHDL: 196.09
TRIGLYCERIDES: 117 mg/dL (ref 0.0–149.0)
Total CHOL/HDL Ratio: 5
VLDL: 23.4 mg/dL (ref 0.0–40.0)

## 2018-03-11 LAB — VITAMIN D 25 HYDROXY (VIT D DEFICIENCY, FRACTURES): VITD: 56.26 ng/mL (ref 30.00–100.00)

## 2018-03-11 NOTE — Telephone Encounter (Signed)
Left message asking pt to call office  Please let pt know lisa will be out of office today and her appointment has been changed to lab only appointment

## 2018-03-11 NOTE — Telephone Encounter (Signed)
error 

## 2018-03-13 ENCOUNTER — Encounter: Payer: Self-pay | Admitting: Family Medicine

## 2018-03-20 ENCOUNTER — Encounter: Payer: Self-pay | Admitting: Family Medicine

## 2018-03-20 ENCOUNTER — Ambulatory Visit (INDEPENDENT_AMBULATORY_CARE_PROVIDER_SITE_OTHER): Payer: Medicare Other | Admitting: Family Medicine

## 2018-03-20 ENCOUNTER — Other Ambulatory Visit: Payer: Self-pay | Admitting: Family Medicine

## 2018-03-20 ENCOUNTER — Other Ambulatory Visit (INDEPENDENT_AMBULATORY_CARE_PROVIDER_SITE_OTHER): Payer: Medicare Other

## 2018-03-20 VITALS — BP 140/80 | HR 84 | Temp 98.3°F | Ht 60.75 in | Wt 130.5 lb

## 2018-03-20 DIAGNOSIS — I10 Essential (primary) hypertension: Secondary | ICD-10-CM

## 2018-03-20 DIAGNOSIS — Z1211 Encounter for screening for malignant neoplasm of colon: Secondary | ICD-10-CM | POA: Diagnosis not present

## 2018-03-20 DIAGNOSIS — R944 Abnormal results of kidney function studies: Secondary | ICD-10-CM | POA: Diagnosis not present

## 2018-03-20 DIAGNOSIS — Z0000 Encounter for general adult medical examination without abnormal findings: Secondary | ICD-10-CM

## 2018-03-20 DIAGNOSIS — E7800 Pure hypercholesterolemia, unspecified: Secondary | ICD-10-CM | POA: Diagnosis not present

## 2018-03-20 DIAGNOSIS — M436 Torticollis: Secondary | ICD-10-CM

## 2018-03-20 DIAGNOSIS — M542 Cervicalgia: Secondary | ICD-10-CM

## 2018-03-20 DIAGNOSIS — M8580 Other specified disorders of bone density and structure, unspecified site: Secondary | ICD-10-CM

## 2018-03-20 DIAGNOSIS — C541 Malignant neoplasm of endometrium: Secondary | ICD-10-CM

## 2018-03-20 LAB — FECAL OCCULT BLOOD, IMMUNOCHEMICAL: Fecal Occult Bld: NEGATIVE

## 2018-03-20 MED ORDER — CYCLOBENZAPRINE HCL 10 MG PO TABS: 5.0000 mg | ORAL_TABLET | Freq: Every evening | ORAL | 0 refills | Status: DC | PRN

## 2018-03-20 NOTE — Patient Instructions (Addendum)
Get back to walking or other exercise daily.  Increase water intake. Hold ibuprofen/naprosyn.  return for labs only check to check renal panel in 2-3 weeks.  Restart red yeast rice ( 600 mg 2 tabs twice daily and flax seed.  Get shingels vaccine and td at pharmacy   We will call to set up bone density.  Start upper neck stretching. Heat on upper back. Can use muscle relaxant as needed for muslce stiffness.

## 2018-03-20 NOTE — Assessment & Plan Note (Addendum)
Poor control .. worse off red yeast rice.  Restart red yeast rice ( 600 mg 2 tabs twice daily and flax seed.

## 2018-03-20 NOTE — Assessment & Plan Note (Signed)
Stop NSADIS and increase water.. recheck in 2 weeks.

## 2018-03-20 NOTE — Progress Notes (Signed)
Subjective:    Patient ID: [name], female    DOB: 1940/02/21, 78 y.o.   MRN: [id number]  HPI  The patient presents for annual medicare wellness, complete physical and review of chronic health problems. He/She also has the following acute concerns today: She feels sore all over..  Both arms ( in upper arm muscle, no shoulder pain), left  Hip pain seems to move... sometimes waking up in AMs stiff and sore. Low back pain.  Seems to flare with weather.  Currently achy in neck and right upper arm.  No joint swelling, or redness.  Occ takes an ibuprofen or aleve.. does help but tying to limit as makes ringing in ears worse.  Hypertension: Well controlled on losartan HCTZ    BP Readings from Last 3 Encounters:  03/20/18 140/80  03/11/17 130/80  03/05/17 124/76  Using medication without problems or lightheadedness:  Chest pain with exertion:none Edema:none Short of breath:none Average home BPs: not checking Other issues:     Elevated Cholesterol:  Has stopped red yeast rice of and on, flaxseed   NOT AT GOAL. Pt remains not interested in prescription statin.   Lab Results  Component Value Date   CHOL 245 (H) 03/11/2018   HDL 49.30 03/11/2018   LDLCALC 173 (H) 03/11/2018   LDLDIRECT 148.1 11/17/2012   TRIG 117.0 03/11/2018   CHOLHDL 5 03/11/2018    Using medications without problems: Muscle aches:  Diet compliance: good Exercise: none Other complaints:   Stage 1A Grade 2 endometrial cancer :S/p robotic assisted total hysterectomy, BSO, sentinel lymph node mappingon 06/13/15. noLVSI, 40% myometrial invasion, negativelymph nodes. Followed by Dr. Denman George. Surgeon Dr. Skeet Latch. No complications.    Advance directives and end of life planning reviewed in detail with patient and documented in EMR. Patient given handout on advance care directives if needed. HCPOA and living will updated if needed.  Fall Risk  03/20/2018 03/05/2017 03/04/2016 03/03/2015 11/24/2012  Falls in  the past year? 0 No No No No     Office Visit from 03/20/2018 in [place] at [facility]  PHQ-2 Total Score  0      Visual Acuity Screening   Right eye Left eye Both eyes  Without correction: 20/25 20/40 20/25   With correction:     Hearing Screening Comments: Wears Bilateral hearing aides   Social History /Family History/Past Medical History reviewed in detail and updated in EMR if needed. Blood pressure 140/80, pulse 84, temperature 98.3 F (36.8 C), temperature source Oral, height 5' 0.75" (1.543 m), weight 130 lb 8 oz (59.2 kg).   Review of Systems  Constitutional: Negative for fatigue and fever.  HENT: Negative for congestion.   Eyes: Negative for pain.  Respiratory: Negative for cough and shortness of breath.   Cardiovascular: Negative for chest pain, palpitations and leg swelling.  Gastrointestinal: Negative for abdominal pain.  Genitourinary: Negative for dysuria and vaginal bleeding.  Musculoskeletal: Negative for back pain.  Neurological: Negative for syncope, light-headedness and headaches.  Psychiatric/Behavioral: Negative for dysphoric mood.       Objective:   Physical Exam Constitutional:      General: She is not in acute distress.    Appearance: Normal appearance. She is well-developed. She is not ill-appearing or toxic-appearing.  HENT:     Head: Normocephalic.     Right Ear: Hearing, tympanic membrane, ear canal and external ear normal.     Left Ear: Hearing, tympanic membrane, ear canal and external ear normal.  Nose: Nose normal.  Eyes:     General: Lids are normal. Lids are everted, no foreign bodies appreciated.     Conjunctiva/sclera: Conjunctivae normal.     Pupils: Pupils are equal, round, and reactive to light.  Neck:     Musculoskeletal: Normal range of motion and neck supple.     Thyroid: No thyroid mass or thyromegaly.     Vascular: No carotid bruit.     Trachea: Trachea normal.  Cardiovascular:     Rate and Rhythm: Normal  rate and regular rhythm.     Heart sounds: Normal heart sounds, S1 normal and S2 normal. No murmur. No gallop.   Pulmonary:     Effort: Pulmonary effort is normal. No respiratory distress.     Breath sounds: Normal breath sounds. No wheezing, rhonchi or rales.  Abdominal:     General: Bowel sounds are normal. There is no distension or abdominal bruit.     Palpations: Abdomen is soft. There is no fluid wave or mass.     Tenderness: There is no abdominal tenderness. There is no guarding or rebound.     Hernia: No hernia is present.  Musculoskeletal:     Right shoulder: She exhibits decreased range of motion and tenderness.     Cervical back: She exhibits decreased range of motion and tenderness. She exhibits no bony tenderness.     Left upper arm: She exhibits tenderness. She exhibits no bony tenderness.  Lymphadenopathy:     Cervical: No cervical adenopathy.  Skin:    General: Skin is warm and dry.     Findings: No rash.  Neurological:     Mental Status: She is alert.     Cranial Nerves: No cranial nerve deficit.     Sensory: No sensory deficit.  Psychiatric:        Mood and Affect: Mood is not anxious or depressed.        Speech: Speech normal.        Behavior: Behavior normal. Behavior is cooperative.        Judgment: Judgment normal.           Assessment & Plan:  The patient's preventative maintenance and recommended screening tests for an annual wellness exam were reviewed in full today. Brought up to date unless services declined.  Counselled on the importance of diet, exercise, and its role in overall health and mortality. The patient's FH and SH was reviewed, including their home life, tobacco status, and drug and alcohol status.    TAH Vaccines:uptodate except due for tdap, shingles Colonoscopy: 06/2005, has turned in stool cards today. Mammo:9/2019hx of breast cancer DEXA: The patient's last bone density scan on 04/29/2011 showed a T score of -1.4  (osteopenia), not interested in fosamax. Repeat 04/2013: increase in spine and decrease in femur compared to 2013, but no change compared to earlier 2006, plan repeat in 2020. DUE NOW Non smoker.

## 2018-03-26 DIAGNOSIS — M79601 Pain in right arm: Secondary | ICD-10-CM | POA: Insufficient documentation

## 2018-03-26 NOTE — Assessment & Plan Note (Signed)
Well controlled. Continue current medication.  

## 2018-03-26 NOTE — Assessment & Plan Note (Signed)
Followed by ONC. 

## 2018-03-26 NOTE — Assessment & Plan Note (Signed)
Start upper neck stretching. Heat on upper back. Can use muscle relaxant as needed for muslce stiffness.

## 2018-04-03 ENCOUNTER — Other Ambulatory Visit: Payer: Self-pay | Admitting: Family Medicine

## 2018-04-03 NOTE — Addendum Note (Signed)
Addended by: Carter Kitten on: 04/03/2018 09:16 AM   Modules accepted: Orders

## 2018-04-07 ENCOUNTER — Other Ambulatory Visit (INDEPENDENT_AMBULATORY_CARE_PROVIDER_SITE_OTHER): Payer: Medicare Other

## 2018-04-07 DIAGNOSIS — R944 Abnormal results of kidney function studies: Secondary | ICD-10-CM | POA: Diagnosis not present

## 2018-04-07 LAB — RENAL FUNCTION PANEL
Albumin: 4.1 g/dL (ref 3.5–5.2)
BUN: 20 mg/dL (ref 6–23)
CO2: 26 mEq/L (ref 19–32)
Calcium: 9.5 mg/dL (ref 8.4–10.5)
Chloride: 103 mEq/L (ref 96–112)
Creatinine, Ser: 0.92 mg/dL (ref 0.40–1.20)
GFR: 58.74 mL/min — ABNORMAL LOW (ref >60.00)
Glucose, Bld: 85 mg/dL (ref 70–99)
Phosphorus: 3.5 mg/dL (ref 2.3–4.6)
Potassium: 4 mEq/L (ref 3.5–5.1)
Sodium: 137 mEq/L (ref 135–145)

## 2018-04-07 NOTE — Progress Notes (Signed)
renal

## 2018-05-21 ENCOUNTER — Other Ambulatory Visit: Payer: Self-pay

## 2018-06-02 ENCOUNTER — Ambulatory Visit (INDEPENDENT_AMBULATORY_CARE_PROVIDER_SITE_OTHER): Payer: Medicare Other | Admitting: Family Medicine

## 2018-06-02 ENCOUNTER — Encounter: Payer: Self-pay | Admitting: Family Medicine

## 2018-06-02 DIAGNOSIS — M79604 Pain in right leg: Secondary | ICD-10-CM | POA: Insufficient documentation

## 2018-06-02 DIAGNOSIS — M79601 Pain in right arm: Secondary | ICD-10-CM

## 2018-06-02 DIAGNOSIS — M79671 Pain in right foot: Secondary | ICD-10-CM | POA: Insufficient documentation

## 2018-06-02 NOTE — Assessment & Plan Note (Signed)
Chronic, This may also be related to OA and nerve compression.   NO red flags for urgent eval.   If not improving.Marland Kitchen opt will make appt for X-ray and inoffice eval.

## 2018-06-02 NOTE — Progress Notes (Signed)
AVS and Herniated Discs exercises mailed to patient as instructed by Dr. Diona Browner.

## 2018-06-02 NOTE — Assessment & Plan Note (Signed)
Difficult to assess without exam or video. No clear suggestion of DVT. Pt not interested in in office visit at this time.  Sounds most like Sciatica, OA etc.... wills tart with increase in dose of ibuprofen to 800 mg TID x 2-3 days, heat and start sciatica stretches.  If pain severe and not improving she will make and appt for in office visit and likley X-ray of lumbar spine.

## 2018-06-02 NOTE — Patient Instructions (Signed)
Heat, start stretching exercises.  Can use ibuprofen 800 mg three times daily x 2-3 days. If pain severe or not improving as expected... make in office visit.

## 2018-06-02 NOTE — Progress Notes (Signed)
VIRTUAL VISIT Due to national recommendations of social distancing due to [place] 19, a virtual visit is felt to be most appropriate for this patient at this time.   I connected with the patient on 06/02/18 at 10:40 AM EDT by virtual telehealth platform and verified that I am speaking with the correct person using two identifiers.  Interactive audio and video telecommunications were attempted between this provider and patient, however failed, due to patient having technical difficulties OR patient did not have access to video capability.  We continued and completed visit with audio only.     I discussed the limitations, risks, security and privacy concerns of performing an evaluation and management service by  virtual telehealth platform and the availability of in person appointments. I also discussed with the patient that there may be a patient responsible charge related to this service. The patient expressed understanding and agreed to proceed.  Patient location: Home Provider Location: [facility] [place] Participants: Eliezer Lofts and [name]   Chief Complaint  Patient presents with  . Leg Pain    bilateral sides-mainly on the right side. Upper leg and near upper buttocks area. Off and on. Stretching helps.   . Arm Pain    still has an issue with this off and on. Radiates to right and left arm. Discussed this on her last visit.    History of Present Illness: 78 year old female patient presents with new onset intermittent pain in right buttock and upper leg to right knee, occ on other side as well. ONgoing for several month off and on. occ pain 8/10 on pain scale.  No leg weakness, no numbness.  No low back pain.  No known fall or injury.  no swelling in legs or arms.   She also continues to have pain in neck and right upper arm off and on. She never used muscle relaxant   She has not had X-rays of either site.   She occ uses 1 tab of ibuprofen with some relief. Stretching  helps a lot.  GFR 57 last check.   COVID 19 screen No recent travel or known exposure to COVID19 The patient denies respiratory symptoms of COVID 19 at this time.  The importance of social distancing was discussed today.   Review of Systems  Constitutional: Negative for chills and fever.  HENT: Negative for congestion and ear pain.   Eyes: Negative for pain and redness.  Respiratory: Negative for cough and shortness of breath.   Cardiovascular: Negative for chest pain, palpitations and leg swelling.  Gastrointestinal: Negative for abdominal pain, blood in stool, constipation, diarrhea, nausea and vomiting.  Genitourinary: Negative for dysuria.  Musculoskeletal: Negative for falls and myalgias.  Skin: Negative for rash.  Neurological: Negative for dizziness.  Psychiatric/Behavioral: Negative for depression. The patient is not nervous/anxious.       Past Medical History:  Diagnosis Date  . Breast cancer ([place]) 04/2003   Invasive ductal carcinoma of the right breast  . Complication of anesthesia    "slow to wake up"  . Hard of hearing   . Hypertension   . Osteopenia     reports that she has never smoked. She has never used smokeless tobacco. She reports that she does not drink alcohol or use drugs.   Current Outpatient Medications:  .  BIOTIN PO, Take 1 tablet by mouth every morning., Disp: , Rfl:  .  Cholecalciferol 1000 UNITS capsule, Take 1,000 Units by mouth every morning. , Disp: ,  Rfl:  .  Cyanocobalamin (B-12 PO), Take 1 tablet by mouth daily. , Disp: , Rfl:  .  Flaxseed, Linseed, (FLAX SEED OIL) 1000 MG CAPS, Take 1 capsule by mouth daily., Disp: , Rfl:  .  hydrochlorothiazide (HYDRODIURIL) 12.5 MG tablet, TAKE 1 TABLET BY MOUTH DAILY, Disp: 90 tablet, Rfl: 1 .  losartan (COZAAR) 50 MG tablet, TAKE 1 TABLET BY MOUTH DAILY, Disp: 90 tablet, Rfl: 1 .  Red Yeast Rice Extract (RED YEAST RICE PO), Take 1,200 mg by mouth every morning. , Disp: , Rfl:  .  sodium fluoride-calcium  carbonate (FLORICAL) 8.3-364 MG CAPS, Take 1 capsule by mouth 2 (two) times daily., Disp: , Rfl:    Observations/Objective: Blood pressure 131/68, pulse 92, height 5' 0.75" (1.543 m).  Physical Exam  Physical Exam Constitutional:      General: She is not in acute distress. Pulmonary:     Effort: Pulmonary effort is normal. No respiratory distress.  Neurological:     Mental Status: She is alert and oriented to person, place, and time.  Psychiatric:        Mood and Affect: Mood normal.        Behavior: Behavior normal.   Assessment and Plan Right leg pain Difficult to assess without exam or video. No clear suggestion of DVT. Pt not interested in in office visit at this time.  Sounds most like Sciatica, OA etc.... wills tart with increase in dose of ibuprofen to 800 mg TID x 2-3 days, heat and start sciatica stretches.  If pain severe and not improving she will make and appt for in office visit and likley X-ray of lumbar spine.  Right arm pain  Chronic, This may also be related to OA and nerve compression.   NO red flags for urgent eval.   If not improving.Mar[facility] Kitchen opt will make appt for X-ray and inoffice eval.     I discussed the assessment and treatment plan with the patient. The patient was provided an opportunity to ask questions and all were answered. The patient agreed with the plan and demonstrated an understanding of the instructions.   The patient was advised to call back or seek an in-person evaluation if the symptoms worsen or if the condition fails to improve as anticipated.   Visit took 15 minutes via telephone  Eliezer Lofts, MD

## 2018-06-09 DIAGNOSIS — C44311 Basal cell carcinoma of skin of nose: Secondary | ICD-10-CM | POA: Diagnosis not present

## 2018-06-09 DIAGNOSIS — X32XXXD Exposure to sunlight, subsequent encounter: Secondary | ICD-10-CM | POA: Diagnosis not present

## 2018-06-09 DIAGNOSIS — L570 Actinic keratosis: Secondary | ICD-10-CM | POA: Diagnosis not present

## 2018-06-09 DIAGNOSIS — D0461 Carcinoma in situ of skin of right upper limb, including shoulder: Secondary | ICD-10-CM | POA: Diagnosis not present

## 2018-07-31 ENCOUNTER — Ambulatory Visit (HOSPITAL_COMMUNITY)
Admission: EM | Admit: 2018-07-31 | Discharge: 2018-07-31 | Disposition: A | Discharge status: Home / Self Care | Payer: Medicare Other | Attending: Internal Medicine | Admitting: Internal Medicine

## 2018-07-31 ENCOUNTER — Telehealth: Payer: Self-pay | Admitting: *Deleted

## 2018-07-31 ENCOUNTER — Encounter (HOSPITAL_COMMUNITY): Payer: Self-pay

## 2018-07-31 ENCOUNTER — Other Ambulatory Visit: Payer: Self-pay

## 2018-07-31 DIAGNOSIS — K645 Perianal venous thrombosis: Secondary | ICD-10-CM

## 2018-07-31 MED ORDER — SENNA 8.6 MG PO TABS: 1.0000 | ORAL_TABLET | Freq: Every day | ORAL | 0 refills | Status: DC | PRN

## 2018-07-31 MED ORDER — HYDROCORTISONE ACETATE 25 MG RE SUPP: 25.0000 mg | Freq: Two times a day (BID) | RECTAL | 0 refills | Status: DC

## 2018-07-31 NOTE — ED Provider Notes (Signed)
[facility]    CSN: [id number] Arrival date & time: 07/31/18  1549      History   Chief Complaint Chief Complaint  Patient presents with  . Rectal Pain    HPI [name] is a 78 y.o. female with a history of hemorrhoids comes to urgent care with complaints of rectal pain with pressure over the past 24 hours.  Patient was taken Tylenol alternating with Motrin for pain control.  Patient eventually had a bowel movement this morning after she tried self digital exam.  After patient had a bowel movement, she had a gush of discolored dark discharge from the rectal area.  The pain immediately subsided after the drainage stopped.  She currently denies any pain.  No vaginal discharge.  No urgency frequency or dysuria.  Her last colonoscopy was 5 years ago and was unremarkable.  She currently does do annual screening using the stool guaiac cards and that was unremarkable of this year as well.  No weight loss or changes. HPI  Past Medical History:  Diagnosis Date  . Breast cancer ([place]) 04/2003   Invasive ductal carcinoma of the right breast  . Complication of anesthesia    "slow to wake up"  . Hard of hearing   . Hypertension   . Osteopenia     Patient Active Problem List   Diagnosis Date Noted  . Right leg pain 06/02/2018  . Right arm pain 03/26/2018  . Decreased GFR 03/20/2018  . Arthritis of carpometacarpal [facility]) joint of left thumb 03/08/2016  . Trochanteric bursitis of left hip 03/08/2016  . Bilateral arm pain 03/08/2016  . Medicare annual wellness visit, subsequent 03/04/2016  . Endometrial cancer ([place])   . Postmenopausal bleeding 03/03/2015  . Post-nasal drip 03/03/2015  . Bilateral low back pain without sciatica 03/03/2015  . Counseling regarding end of life decision making 12/23/2013  . 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  . Allergic rhinitis 01/14/2012  . Vaginal laceration, old 02/01/2011  . External hemorrhoids without complication 67/89/3810  .  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 Hx of malignant neoplasm of female breast 09/09/2007  . HYPERCHOLESTEROLEMIA 02/24/2007  . Essential hypertension, benign 10/10/2006  . HEMORRHOIDS, EXTERNAL W/O COMPLICATION 17/51/0258  . Osteopenia 10/10/2006  . HX, PERSONAL, MALIGNANCY, BREAST 10/10/2006    Past Surgical History:  Procedure Laterality Date  . BREAST SURGERY  2005   LUMPECTOMY  . EPISIOTOMY    . HYSTEROSCOPY W/D&C    . LYMPH NODE BIOPSY N/A 06/13/2015   Procedure: SENTINEL  LYMPH NODE BIOPSY;  Surgeon: Janie Morning, MD;  Location: [facility];  Service: Gynecology;  Laterality: N/A;  . PROCTOSCOPY    . PROCTOSOMY REPAIR    . ROBOTIC ASSISTED TOTAL HYSTERECTOMY N/A 06/13/2015   Procedure: XI ROBOTIC ASSISTED TOTAL ABDOMINAL LAPAROSCOPIC HYSTERECTOMY;  Surgeon: Janie Morning, MD;  Location: [facility];  Service: Gynecology;  Laterality: N/A;  . SALPINGOOPHORECTOMY Bilateral 06/13/2015   Procedure: SALPINGO OOPHORECTOMY;  Surgeon: Janie Morning, MD;  Location: [facility];  Service: Gynecology;  Laterality: Bilateral;  . STAPENDECTOMY     BILATERAL    OB History   No obstetric history on file.      Home Medications    Prior to Admission medications   Medication Sig Start Date End Date Taking? Authorizing Provider  BIOTIN PO Take 1 tablet by mouth every morning.    [provider]  Cholecalciferol 1000 UNITS capsule Take 1,000 Units by mouth every morning.     [provider]  Cyanocobalamin (B-12 PO) Take 1 tablet by mouth daily.     [provider]  Flaxseed, Linseed, (FLAX SEED OIL) 1000 MG CAPS Take 1 capsule by mouth daily.    [provider]  hydrochlorothiazide (HYDRODIURIL) 12.5 MG tablet TAKE 1 TABLET BY MOUTH DAILY 04/03/18   Bedsole, Amy E, MD  losartan (COZAAR) 50 MG tablet TAKE 1 TABLET BY MOUTH DAILY 04/03/18   Bedsole, Amy E, MD  Red Yeast Rice Extract (RED YEAST RICE PO) Take 1,200 mg by mouth every morning.     [provider]   sodium fluoride-calcium carbonate (FLORICAL) 8.3-364 MG CAPS Take 1 capsule by mouth 2 (two) times daily.    [provider]    Family History Family History  Problem Relation Age of Onset  . Stroke Father   . Hypertension Mother   . Stroke Sister   . Cancer Brother        Esophageal cancer  . Cancer Brother        Lung smoker  . Parkinson's disease Brother   . Stroke Brother     Social History Social History   Tobacco Use  . Smoking status: Never Smoker  . Smokeless tobacco: Never Used  Substance Use Topics  . Alcohol use: No  . Drug use: No     Allergies   Morphine and related and Penicillins   Review of Systems Review of Systems  Constitutional: Positive for fever. Negative for activity change, appetite change, chills, fatigue and unexpected weight change.  HENT: Negative.   Cardiovascular: Negative.   Gastrointestinal: Positive for anal bleeding and constipation. Negative for abdominal pain, blood in stool, diarrhea, nausea and vomiting.  Genitourinary: Negative.   Musculoskeletal: Negative.   Skin: Negative.   Neurological: Negative for dizziness, weakness, numbness and headaches.  Psychiatric/Behavioral: Negative for agitation, confusion and decreased concentration.     Physical Exam Triage Vital Signs ED Triage Vitals  Enc Vitals Group     BP 07/31/18 1631 131/86     Pulse Rate 07/31/18 1631 91     Resp 07/31/18 1631 18     Temp 07/31/18 1631 97.7 F (36.5 C)     Temp Source 07/31/18 1631 Oral     SpO2 07/31/18 1631 97 %     Weight --      Height --      Head Circumference --      Peak Flow --      Pain Score 07/31/18 1628 0     Pain Loc --      Pain Edu? --      Excl. in [place]? --    No data found.  Updated Vital Signs BP 131/86 (BP Location: Left Arm)   Pulse 91   Temp 97.7 F (36.5 C) (Oral)   Resp 18   SpO2 97%   Visual Acuity Right Eye Distance:   Left Eye Distance:   Bilateral Distance:    Right Eye Near:   Left  Eye Near:    Bilateral Near:     Physical Exam Constitutional:      General: She is not in acute distress.    Appearance: Normal appearance. She is normal weight. She is not ill-appearing or toxic-appearing.  HENT:     Nose: Nose normal.     Mouth/Throat:     Mouth: Mucous membranes are moist.  Cardiovascular:     Rate and Rhythm: Normal rate and regular rhythm.     Pulses: Normal pulses.  Heart sounds: Normal heart sounds.  Pulmonary:     Effort: Pulmonary effort is normal.     Breath sounds: Normal breath sounds.  Abdominal:     General: Bowel sounds are normal. There is no distension.     Palpations: Abdomen is soft. There is no mass.     Tenderness: There is no abdominal tenderness. There is no guarding.  Genitourinary:    Comments: External rectal exam showed some anal tags and external hemorrhoid in the 5 o'clock position. Musculoskeletal: Normal range of motion.        General: No swelling, tenderness, deformity or signs of injury.  Skin:    General: Skin is warm.     Capillary Refill: Capillary refill takes less than 2 seconds.     Coloration: Skin is not jaundiced.     Findings: No bruising or lesion.  Neurological:     General: No focal deficit present.     Mental Status: She is alert and oriented to person, place, and time.      UC Treatments / Results  Labs (all labs ordered are listed, but only abnormal results are displayed) Labs Reviewed - No data to display  EKG None  Radiology No results found.  Procedures Procedures (including critical care time)  Medications Ordered in UC Medications - No data to display  Initial Impression / Assessment and Plan / UC Course  I have reviewed the triage vital signs and the nursing notes.  Pertinent labs & imaging results that were available during my care of the patient were reviewed by me and considered in my medical decision making (see chart for details).     1.  Thrombosed internal hemorrhoids with  bleeding, currently subsided: Anusol suppository Senna BID  Tylenol and Motrin as needed for pain If patient's symptoms recur she needs to be reevaluated. Final Clinical Impressions(s) / UC Diagnoses   Final diagnoses:  None   Discharge Instructions   None    ED Prescriptions    None     Controlled Substance Prescriptions [place] Controlled Substance Registry consulted? No   Chase Picket, MD 07/31/18 [phone]

## 2018-07-31 NOTE — ED Triage Notes (Signed)
Patient presents to Urgent Care with complaints of rectal pressure since the day before yesterday. Patient reports it felt like she needed to have a BM but didn't, pain was worse yesterday, attempted self digital exam and could tell it was swollen. Pt states she finally had a BM this morning before her brother's funeral and her pain has gone away but is now having puss draining from her rectum, attempted e-visit with PCP for abx and she was sent here.

## 2018-07-31 NOTE — Telephone Encounter (Signed)
noted 

## 2018-07-31 NOTE — Telephone Encounter (Signed)
Patient called stating that she was having pain in her rectum area yesterday. Patient stated that she thinks that she had an infection in her return and it bursted this morning. Patient stated that she had to go to a graveside service this morning because her brother passed away.  Patient stated that she is now draining infection from her rectum and requested an antibiotic. Patient stated that she had this happen to her years ago. Patient stated that her rectum is draining a small amount of pus. [facility]   After taking with Webb Silversmith NP patient was advised to that she needs to go to an Urgent Care for ER for treatment and patient verbalized understanding. Patient stated that she will go to [facility] now.

## 2018-08-03 ENCOUNTER — Telehealth: Payer: Self-pay

## 2018-08-03 NOTE — Telephone Encounter (Signed)
Pt went to [facility] on 07/31/18 for thrombosed internal hemorrhoid. Pt was given anusol supp, senna bid and pt wants to know if needs abx. Pt thought pus came out of hemorrhoid. Pt said the ED physician did not recommend an abx but pt wanted to know Dr Arley Phenix thoughts about taking an abx. Pt request cb after Dr Diona Browner reviews. [facility].

## 2018-08-04 NOTE — Telephone Encounter (Signed)
Fiber, water, treat constipation with miralax.

## 2018-08-04 NOTE — Telephone Encounter (Signed)
Patient advised. Patient states she is much better since the hemorrhoid popped. No fever or any other symptoms of concern. Patient did want to know if there is anything she can do to prevent this from happening again?

## 2018-08-04 NOTE — Telephone Encounter (Signed)
Mrs. [name] notified as instructed by telephone.  Patient states understanding.

## 2018-08-04 NOTE — Telephone Encounter (Signed)
No antibiotics needed. But if not improving have her make appt for in office exam/anoscopy

## 2018-08-13 ENCOUNTER — Ambulatory Visit
Admission: RE | Admit: 2018-08-13 | Discharge: 2018-08-13 | Disposition: A | Discharge status: Home / Self Care | Payer: Medicare Other | Source: Ambulatory Visit | Attending: Family Medicine | Admitting: Family Medicine

## 2018-08-13 ENCOUNTER — Encounter: Payer: Self-pay | Admitting: *Deleted

## 2018-08-13 DIAGNOSIS — M85852 Other specified disorders of bone density and structure, left thigh: Secondary | ICD-10-CM | POA: Diagnosis not present

## 2018-08-13 DIAGNOSIS — Z780 Asymptomatic menopausal state: Secondary | ICD-10-CM | POA: Diagnosis not present

## 2018-08-13 DIAGNOSIS — M8580 Other specified disorders of bone density and structure, unspecified site: Secondary | ICD-10-CM

## 2018-08-25 DIAGNOSIS — L570 Actinic keratosis: Secondary | ICD-10-CM | POA: Diagnosis not present

## 2018-08-25 DIAGNOSIS — X32XXXD Exposure to sunlight, subsequent encounter: Secondary | ICD-10-CM | POA: Diagnosis not present

## 2018-08-25 DIAGNOSIS — Z85828 Personal history of other malignant neoplasm of skin: Secondary | ICD-10-CM | POA: Diagnosis not present

## 2018-08-25 DIAGNOSIS — Z08 Encounter for follow-up examination after completed treatment for malignant neoplasm: Secondary | ICD-10-CM | POA: Diagnosis not present

## 2018-09-01 ENCOUNTER — Other Ambulatory Visit: Payer: Self-pay

## 2018-09-01 ENCOUNTER — Ambulatory Visit (INDEPENDENT_AMBULATORY_CARE_PROVIDER_SITE_OTHER): Payer: Medicare Other | Admitting: Family Medicine

## 2018-09-01 ENCOUNTER — Encounter: Payer: Self-pay | Admitting: Family Medicine

## 2018-09-01 DIAGNOSIS — M5416 Radiculopathy, lumbar region: Secondary | ICD-10-CM | POA: Diagnosis not present

## 2018-09-01 MED ORDER — MELOXICAM 7.5 MG PO TABS: 7.5000 mg | ORAL_TABLET | Freq: Every day | ORAL | 0 refills | Status: DC

## 2018-09-01 NOTE — Assessment & Plan Note (Signed)
Offered prednisone taper and PT referral. Pt wishes to start with meloxicam  And home PT. Info given. Encouraged more regualr PT.   no red flags, no focal vertebral ttp and no fall... with osteopenia and age if not improving in 2 weeks check X-ray.

## 2018-09-01 NOTE — Patient Instructions (Signed)
Stop ibuprofen.  Start meloxicam 1-2 daily x 2 weeks.  Apply heat to low back  Daily.  Start 1-2 times daily home exercises. Call if not improving as expected in 2 weeks.. follow up for X-ray.

## 2018-09-01 NOTE — Progress Notes (Signed)
Chief Complaint  Patient presents with  . Hip Pain    Right x 3 weeks    History of Present Illness: HPI    78 year old female presents for right hip pain x 3 weeks. Pain has been going off and on for several month.. worse in last 1-3 weeks.  She recently went to beach and .. rode 4 hours in car back and forth, different pain Fall/known injury: none Was assessed in 05/2018... virtual visit right leg pain. Location: right low low back  Radiates to right buttock and upper thigh.  no numbness, no weakness  no new incontinence  worse with: bending over, walking, sitting  better with: laying down.  Able to sleep.   Has treated with: ibuprofen 400 mg prn, heat, has tried a little home exercises, not much.  No history of  hip or back surgery    DEXA: osteopenia  COVID 19 screen No recent travel or known exposure to [place] The patient denies respiratory symptoms of COVID 19 at this time.  The importance of social distancing was discussed today.   ROS    Past Medical History:  Diagnosis Date  . Breast cancer ([place]) 04/2003   Invasive ductal carcinoma of the right breast  . Complication of anesthesia    "slow to wake up"  . Hard of hearing   . Hypertension   . Osteopenia     reports that she has never smoked. She has never used smokeless tobacco. She reports that she does not drink alcohol or use drugs.   Current Outpatient Medications:  .  BIOTIN PO, Take 1 tablet by mouth every morning., Disp: , Rfl:  .  Cholecalciferol 1000 UNITS capsule, Take 1,000 Units by mouth every morning. , Disp: , Rfl:  .  Cyanocobalamin (B-12 PO), Take 1 tablet by mouth daily. , Disp: , Rfl:  .  Flaxseed, Linseed, (FLAX SEED OIL) 1000 MG CAPS, Take 1 capsule by mouth daily., Disp: , Rfl:  .  hydrochlorothiazide (HYDRODIURIL) 12.5 MG tablet, TAKE 1 TABLET BY MOUTH DAILY, Disp: 90 tablet, Rfl: 1 .  losartan (COZAAR) 50 MG tablet, TAKE 1 TABLET BY MOUTH DAILY, Disp: 90 tablet, Rfl: 1 .  Red Yeast  Rice Extract (RED YEAST RICE PO), Take 1,200 mg by mouth every morning. , Disp: , Rfl:  .  sodium fluoride-calcium carbonate (FLORICAL) 8.3-364 MG CAPS, Take 1 capsule by mouth 2 (two) times daily., Disp: , Rfl:    Observations/Objective: Blood pressure 120/64, pulse 80, temperature 98 F (36.7 C), temperature source Temporal, height 5' 0.75" (1.543 m), weight 129 lb 8 oz (58.7 kg).  Physical Exam Constitutional:      General: She is not in acute distress.    Appearance: Normal appearance. She is well-developed. She is not ill-appearing or toxic-appearing.  HENT:     Head: Normocephalic.     Right Ear: Hearing, tympanic membrane, ear canal and external ear normal. Tympanic membrane is not erythematous, retracted or bulging.     Left Ear: Hearing, tympanic membrane, ear canal and external ear normal. Tympanic membrane is not erythematous, retracted or bulging.     Nose: No mucosal edema or rhinorrhea.     Right Sinus: No maxillary sinus tenderness or frontal sinus tenderness.     Left Sinus: No maxillary sinus tenderness or frontal sinus tenderness.     Mouth/Throat:     Pharynx: Uvula midline.  Eyes:     General: Lids are normal. Lids are everted, no foreign  bodies appreciated.     Conjunctiva/sclera: Conjunctivae normal.     Pupils: Pupils are equal, round, and reactive to light.  Neck:     Musculoskeletal: Normal range of motion and neck supple.     Thyroid: No thyroid mass or thyromegaly.     Vascular: No carotid bruit.     Trachea: Trachea normal.  Cardiovascular:     Rate and Rhythm: Normal rate and regular rhythm.     Pulses: Normal pulses.     Heart sounds: Normal heart sounds, S1 normal and S2 normal. No murmur. No friction rub. No gallop.   Pulmonary:     Effort: Pulmonary effort is normal. No tachypnea or respiratory distress.     Breath sounds: Normal breath sounds. No decreased breath sounds, wheezing, rhonchi or rales.  Abdominal:     General: Bowel sounds are  normal.     Palpations: Abdomen is soft.     Tenderness: There is no abdominal tenderness.  Musculoskeletal:     Lumbar back: She exhibits tenderness. She exhibits no bony tenderness, no swelling and no edema.     Comments: Surprisingly great ROM of bilateral hips and back.  ttp in right sciatic notch  Skin:    General: Skin is warm and dry.     Findings: No rash.  Neurological:     Mental Status: She is alert and oriented to person, place, and time.     GCS: GCS eye subscore is 4. GCS verbal subscore is 5. GCS motor subscore is 6.     Cranial Nerves: No cranial nerve deficit.     Sensory: No sensory deficit.     Motor: No abnormal muscle tone.     Coordination: Coordination normal.     Gait: Gait normal.     Deep Tendon Reflexes: Reflexes are normal and symmetric.     Comments: Nml cerebellar exam   No papilledema  Psychiatric:        Mood and Affect: Mood is not anxious or depressed.        Speech: Speech normal.        Behavior: Behavior normal. Behavior is cooperative.        Thought Content: Thought content normal.        Cognition and Memory: Memory is not impaired. She does not exhibit impaired recent memory or impaired remote memory.        Judgment: Judgment normal.      Assessment and Plan   Lumbar back pain with radiculopathy affecting right lower extremity Offered prednisone taper and PT referral. Pt wishes to start with meloxicam  And home PT. Info given. Encouraged more regualr PT.   no red flags, no focal vertebral ttp and no fall... with osteopenia and age if not improving in 2 weeks check X-ray.     Eliezer Lofts, MD

## 2018-09-02 DIAGNOSIS — H9313 Tinnitus, bilateral: Secondary | ICD-10-CM | POA: Diagnosis not present

## 2018-09-02 DIAGNOSIS — H6123 Impacted cerumen, bilateral: Secondary | ICD-10-CM | POA: Diagnosis not present

## 2018-09-08 ENCOUNTER — Telehealth: Payer: Self-pay | Admitting: Family Medicine

## 2018-09-08 NOTE — Telephone Encounter (Signed)
Best number [phone]   Cell [phone]  Pt called stating her hip pain is the same.  She is doing everything dr Diona Browner has told her to do.  She is wanting to get and xray and have  PT  She can move it and sometimes she feels something go back in place and she can walk on it with out pain.

## 2018-09-08 NOTE — Telephone Encounter (Signed)
Mrs. Pair notified as instructed by telephone.  She wants to see Dr. Lorelei Pont and get a x-ray first.  Appointment schedule for 09/09/2018 at 9:00 am with Dr. Lorelei Pont.

## 2018-09-08 NOTE — Telephone Encounter (Signed)
I would recommend starting with PT. If not improving make follow up appt with Dr. Lorelei Pont [facility].[place] can be done at that [facility]. If she wants X-ray now... she can make appt with Dr. Lorelei Pont prior to PT for X-ray and exam. If she does not want to see Dr. Loletha Grayer.. put her in with me for X-ray today.

## 2018-09-09 ENCOUNTER — Other Ambulatory Visit: Payer: Self-pay

## 2018-09-09 ENCOUNTER — Ambulatory Visit (INDEPENDENT_AMBULATORY_CARE_PROVIDER_SITE_OTHER): Payer: Medicare Other | Admitting: Family Medicine

## 2018-09-09 ENCOUNTER — Ambulatory Visit (INDEPENDENT_AMBULATORY_CARE_PROVIDER_SITE_OTHER)
Admission: RE | Admit: 2018-09-09 | Discharge: 2018-09-09 | Disposition: A | Discharge status: Home / Self Care | Payer: Medicare Other | Source: Ambulatory Visit | Attending: Family Medicine | Admitting: Family Medicine

## 2018-09-09 ENCOUNTER — Encounter: Payer: Self-pay | Admitting: Family Medicine

## 2018-09-09 VITALS — BP 130/70 | HR 80 | Temp 98.5°F | Ht 60.75 in | Wt 130.2 lb

## 2018-09-09 DIAGNOSIS — M7061 Trochanteric bursitis, right hip: Secondary | ICD-10-CM | POA: Diagnosis not present

## 2018-09-09 DIAGNOSIS — G8929 Other chronic pain: Secondary | ICD-10-CM | POA: Diagnosis not present

## 2018-09-09 DIAGNOSIS — M25551 Pain in right hip: Secondary | ICD-10-CM

## 2018-09-09 DIAGNOSIS — M1610 Unilateral primary osteoarthritis, unspecified hip: Secondary | ICD-10-CM

## 2018-09-09 NOTE — Progress Notes (Signed)
Akasia Ahmad T. Jalonda Antigua, MD Primary Care and [place] at [facility] [place], [address] Phone: [phone]  FAX: [place] - 78 y.o. female  MRN [id number]  Date of Birth: 01/21/1941  Visit Date: 09/09/2018  PCP: Jinny Sanders, MD  Referred by: Jinny Sanders, MD  Chief Complaint  Patient presents with  . Hip Pain    Right   Subjective:   [name] is a 78 y.o. very pleasant female patient with Body mass index is 24.81 kg/m. who presents with the following:  Posterolateral R sided hip pain, can get to a certain place.  Able to walk - but if sits and it will try to walk on it.  She has pain laterally, she also has some pain posterior laterally.  She denies groin pain.  She has relatively good range of motion.  She is not any trauma or injury.  Has been worse over the last 1 year, but notably has been worse over the last month.  She is got no significant history of trauma, fracture, operative intervention.  No fx or surgery  1 year but worse in 1 month  Hip OA GTB  Past Medical History, Surgical History, Social History, Family History, Problem List, Medications, and Allergies have been reviewed and updated if relevant.  Patient Active Problem List   Diagnosis Date Noted  . Right leg pain 06/02/2018  . Right arm pain 03/26/2018  . Decreased GFR 03/20/2018  . 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 Trochanteric bursitis of left hip 03/08/2016  . Bilateral arm pain 03/08/2016  . Medicare annual wellness visit, subsequent 03/04/2016  . Endometrial cancer ([place])   . Postmenopausal bleeding 03/03/2015  . Post-nasal drip 03/03/2015  . Lumbar back pain with radiculopathy affecting right lower extremity 03/03/2015  . Counseling regarding end of life decision making 12/23/2013  . 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  . Allergic rhinitis 01/14/2012  . Vaginal  laceration, old 02/01/2011  . External hemorrhoids without complication 02/63/7858  . 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 Hx of malignant neoplasm of female breast 09/09/2007  . HYPERCHOLESTEROLEMIA 02/24/2007  . Essential hypertension, benign 10/10/2006  . HEMORRHOIDS, EXTERNAL W/O COMPLICATION 85/03/7739  . Osteopenia 10/10/2006  . HX, PERSONAL, MALIGNANCY, BREAST 10/10/2006    Past Medical History:  Diagnosis Date  . Breast cancer ([place]) 04/2003   Invasive ductal carcinoma of the right breast  . Complication of anesthesia    "slow to wake up"  . Hard of hearing   . Hypertension   . Osteopenia     Past Surgical History:  Procedure Laterality Date  . BREAST SURGERY  2005   LUMPECTOMY  . EPISIOTOMY    . HYSTEROSCOPY W/D&C    . LYMPH NODE BIOPSY N/A 06/13/2015   Procedure: SENTINEL  LYMPH NODE BIOPSY;  Surgeon: Janie Morning, MD;  Location: [facility];  Service: Gynecology;  Laterality: N/A;  . PROCTOSCOPY    . PROCTOSOMY REPAIR    . ROBOTIC ASSISTED TOTAL HYSTERECTOMY N/A 06/13/2015   Procedure: XI ROBOTIC ASSISTED TOTAL ABDOMINAL LAPAROSCOPIC HYSTERECTOMY;  Surgeon: Janie Morning, MD;  Location: [facility];  Service: Gynecology;  Laterality: N/A;  . SALPINGOOPHORECTOMY Bilateral 06/13/2015   Procedure: SALPINGO OOPHORECTOMY;  Surgeon: Janie Morning, MD;  Location: [facility];  Service: Gynecology;  Laterality: Bilateral;  . STAPENDECTOMY     BILATERAL    Social History   Socioeconomic History  .  Marital status: Married    Spouse name: Not on file  . Number of children: Not on file  . Years of education: Not on file  . Highest education level: Not on file  Occupational History  . 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 Financial resource strain: Not on file  . Food insecurity    Worry: Not on file    Inability: Not on file  . Transportation needs    Medical: Not on file    Non-medical: Not on file  Tobacco Use  . Smoking status: Never Smoker  . Smokeless  tobacco: Never Used  Substance and Sexual Activity  . Alcohol use: No  . Drug use: No  . Sexual activity: Yes    Birth control/protection: Post-menopausal  Lifestyle  . Physical activity    Days per week: Not on file    Minutes per session: Not on file  . Stress: Not on file  Relationships  . Social Herbalist on phone: Not on file    Gets together: Not on file    Attends religious service: Not on file    Active member of club or organization: Not on file    Attends meetings of clubs or organizations: Not on file    Relationship status: Not on file  . Intimate partner violence    Fear of current or ex partner: Not on file    Emotionally abused: Not on file    Physically abused: Not on file    Forced sexual activity: Not on file  Other Topics Concern  . Not on file  Social History Narrative   Regular exercise--no, occ goes to [employer]   Diet: healthy, fruits and veggies   No living will, no HCPOA (reviewed 2013)    Family History  Problem Relation Age of Onset  . Stroke Father   . Hypertension Mother   . Stroke Sister   . Cancer Brother        Esophageal cancer  . Cancer Brother        Lung smoker  . Parkinson's disease Brother   . Stroke Brother     Allergies  Allergen Reactions  . Morphine And Related     Patient requested not to have morphine. States it is too strong for her.   . Penicillins Rash    Has patient had a PCN reaction causing immediate rash, facial/tongue/throat swelling, SOB or lightheadedness with hypotension: Yes Has patient had a PCN reaction causing severe rash involving mucus membranes or skin necrosis: Unknown Has patient had a PCN reaction that required hospitalization No Has patient had a PCN reaction occurring within the last 10 years: No If all of the above answers are "NO", then may proceed with Cephalosporin use.    Medication list reviewed and updated in full in [place].  GEN: No fevers, chills. Nontoxic. Primarily  MSK c/o today. MSK: Detailed in the HPI GI: tolerating PO intake without difficulty Neuro: No numbness, parasthesias, or tingling associated. Otherwise the pertinent positives of the ROS are noted above.   Objective:   BP 130/70   Pulse 80   Temp 98.5 F (36.9 C) (Temporal)   Ht 5' 0.75" (1.543 m)   Wt 130 lb 4 oz (59.1 kg)   SpO2 96%   BMI 24.81 kg/m    GEN: WDWN, NAD, Non-toxic, Alert & Oriented x 3 HEENT: Atraumatic, Normocephalic.  Ears and Nose: No external deformity. EXTR: No  clubbing/cyanosis/edema NEURO: Normal gait.  PSYCH: Normally interactive. Conversant. Not depressed or anxious appearing.  Calm demeanor.   HIP EXAM: SIDE: R ROM: Abduction, Flexion, Internal and External range of motion: minor loss of motion with IROM and EROM Pain with terminal IROM and EROM: yes GTB: mild ttp SLR: NEG Knees: No effusion FABER: NT REVERSE FABER: NT, neg Piriformis: NT at direct palpation Str: flexion: 4+/5 abduction: 4+/5 adduction: 5/5 Strength testing non-tender     Radiology: Dg Hip Unilat W Or W/o Pelvis 2-3 Views Right  Result Date: 09/09/2018 CLINICAL DATA:  78 year old female with right hip pain for 1 year. EXAM: DG HIP (WITH OR WITHOUT PELVIS) 2-3V RIGHT COMPARISON:  None. FINDINGS: Weightbearing views. Femoral heads are normally located. Mild asymmetric subchondral sclerosis on the right. Suggestion of minor asymmetric right hip joint space loss. Background bone mineralization is within normal limits for age. Degenerative spurring at the pubic symphysis. The pelvis appears intact. SI joints within normal limits. Grossly intact proximal left femur. Negative visible lower abdominal and pelvic visceral contours. Proximal right femur appears intact. No acute osseous abnormality identified. IMPRESSION: No acute osseous abnormality identified. There do appear to be mildly asymmetric degenerative changes at the right hip. Electronically Signed   By: Genevie Ann M.D.   On:  09/09/2018 14:46    Assessment and Plan:     ICD-10-CM   1. Primary localized osteoarthritis of hip  M16.10   2. Chronic right hip pain  M25.551 DG Hip Unilat W OR W/O Pelvis 2-3 Views Right   G89.29   3. Trochanteric bursitis of right hip  M70.61    >25 minutes spent in face to face time with patient, >50% spent in counselling or coordination of care   I think her lateral trochanteric bursitis is secondary.  She does have some mild osteoarthritis with some joint space narrowing and subchondral sclerosis.  Clearly that there is some trochanteric bursitis as well.  I think that she also has some glue medius and minimus tendinopathy on that side.  I gave her some workout regimens to use 3 or 4 days a week from a [employer].  At this point I think doing some basic NSAIDs, Tylenol, icing as needed.  Certainly can inject her hip at any point if  Follow-up: No follow-ups on file.  No orders of the defined types were placed in this encounter.  Orders Placed This Encounter  Procedures  . DG Hip Unilat W OR W/O Pelvis 2-3 Views Right    Signed,  Frederico Hamman T. Aamilah Augenstein, MD   Outpatient Encounter Medications as of 09/09/2018  Medication Sig  . Cholecalciferol 1000 UNITS capsule Take 1,000 Units by mouth every morning.   . Cyanocobalamin (B-12 PO) Take 1 tablet by mouth daily.   . Flaxseed, Linseed, (FLAX SEED OIL) 1000 MG CAPS Take 1 capsule by mouth daily.  . hydrochlorothiazide (HYDRODIURIL) 12.5 MG tablet TAKE 1 TABLET BY MOUTH DAILY  . losartan (COZAAR) 50 MG tablet TAKE 1 TABLET BY MOUTH DAILY  . Red Yeast Rice Extract (RED YEAST RICE PO) Take 1,200 mg by mouth every morning.   . sodium fluoride-calcium carbonate (FLORICAL) 8.3-364 MG CAPS Take 1 capsule by mouth 2 (two) times daily.  . [DISCONTINUED] BIOTIN PO Take 1 tablet by mouth every morning.  . [DISCONTINUED] meloxicam (MOBIC) 7.5 MG tablet Take 1-2 tablets (7.5-15 mg total) by mouth daily.   No facility-administered encounter  medications on file as of 09/09/2018.

## 2018-10-07 ENCOUNTER — Ambulatory Visit (INDEPENDENT_AMBULATORY_CARE_PROVIDER_SITE_OTHER): Payer: Medicare Other

## 2018-10-07 DIAGNOSIS — Z23 Encounter for immunization: Secondary | ICD-10-CM | POA: Diagnosis not present

## 2018-12-07 ENCOUNTER — Other Ambulatory Visit: Payer: Self-pay | Admitting: Family Medicine

## 2018-12-07 DIAGNOSIS — Z1231 Encounter for screening mammogram for malignant neoplasm of breast: Secondary | ICD-10-CM

## 2019-01-04 ENCOUNTER — Other Ambulatory Visit: Payer: Self-pay | Admitting: Family Medicine

## 2019-01-26 ENCOUNTER — Ambulatory Visit
Admission: RE | Admit: 2019-01-26 | Discharge: 2019-01-26 | Disposition: A | Discharge status: Home / Self Care | Payer: Medicare Other | Source: Ambulatory Visit | Attending: Family Medicine | Admitting: Family Medicine

## 2019-01-26 ENCOUNTER — Other Ambulatory Visit: Payer: Self-pay

## 2019-01-26 DIAGNOSIS — Z1231 Encounter for screening mammogram for malignant neoplasm of breast: Secondary | ICD-10-CM

## 2019-04-06 ENCOUNTER — Other Ambulatory Visit: Payer: Self-pay | Admitting: Family Medicine

## 2019-04-15 ENCOUNTER — Ambulatory Visit (INDEPENDENT_AMBULATORY_CARE_PROVIDER_SITE_OTHER): Payer: Medicare Other

## 2019-04-15 ENCOUNTER — Other Ambulatory Visit: Payer: Self-pay

## 2019-04-15 ENCOUNTER — Telehealth: Payer: Self-pay | Admitting: Family Medicine

## 2019-04-15 ENCOUNTER — Other Ambulatory Visit (INDEPENDENT_AMBULATORY_CARE_PROVIDER_SITE_OTHER): Payer: Medicare Other

## 2019-04-15 DIAGNOSIS — Z0000 Encounter for general adult medical examination without abnormal findings: Secondary | ICD-10-CM | POA: Diagnosis not present

## 2019-04-15 DIAGNOSIS — E7800 Pure hypercholesterolemia, unspecified: Secondary | ICD-10-CM | POA: Diagnosis not present

## 2019-04-15 DIAGNOSIS — E559 Vitamin D deficiency, unspecified: Secondary | ICD-10-CM

## 2019-04-15 LAB — LIPID PANEL
Cholesterol: 190 mg/dL (ref 0–200)
HDL: 49.4 mg/dL (ref >39.00)
LDL Cholesterol: 118 mg/dL — ABNORMAL HIGH (ref 0–99)
NonHDL: 140.81
Total CHOL/HDL Ratio: 4
Triglycerides: 113 mg/dL (ref 0.0–149.0)
VLDL: 22.6 mg/dL (ref 0.0–40.0)

## 2019-04-15 LAB — COMPREHENSIVE METABOLIC PANEL
ALT: 14 U/L (ref 0–35)
AST: 15 U/L (ref 0–37)
Albumin: 4 g/dL (ref 3.5–5.2)
Alkaline Phosphatase: 43 U/L (ref 39–117)
BUN: 23 mg/dL (ref 6–23)
CO2: 27 mEq/L (ref 19–32)
Calcium: 9.5 mg/dL (ref 8.4–10.5)
Chloride: 105 mEq/L (ref 96–112)
Creatinine, Ser: 1 mg/dL (ref 0.40–1.20)
GFR: 53.21 mL/min — ABNORMAL LOW (ref >60.00)
Glucose, Bld: 94 mg/dL (ref 70–99)
Potassium: 3.9 mEq/L (ref 3.5–5.1)
Sodium: 141 mEq/L (ref 135–145)
Total Bilirubin: 0.7 mg/dL (ref 0.2–1.2)
Total Protein: 6.5 g/dL (ref 6.0–8.3)

## 2019-04-15 LAB — VITAMIN D 25 HYDROXY (VIT D DEFICIENCY, FRACTURES): VITD: 60.9 ng/mL (ref 30.00–100.00)

## 2019-04-15 NOTE — Telephone Encounter (Signed)
-----   Message from Ellamae Sia sent at 04/01/2019  3:27 PM EST ----- Regarding: lab orders for Thursday, 3.11.21 Patient is scheduled for CPX labs, please order future labs, Thanks , Karna Christmas

## 2019-04-15 NOTE — Progress Notes (Signed)
Subjective:   [name] is a 79 y.o. female who presents for Medicare Annual (Subsequent) preventive examination.  Review of Systems: N/A   This visit is being conducted through telemedicine via telephone at the nurse health advisor's home address due to the COVID-19 pandemic. This patient has given me verbal consent via doximity to conduct this visit, patient states they are participating from their home address. Patient and myself are on the telephone call. There is no referral for this visit. Some vital signs may be absent or patient reported.    Patient identification: identified by name, DOB, and current address   Cardiac Risk Factors include: advanced age (>61men, >67 women);hypertension     Objective:     Vitals: There were no vitals taken for this visit.  There is no height or weight on file to calculate BMI.  Advanced Directives 04/15/2019 03/05/2017 03/04/2016 06/13/2015 06/09/2015  Does Patient Have a Medical Advance Directive? No No Yes No No  Type of Advance Directive - - [place] in Chart? - - No - copy requested - -  Would patient like information on creating a medical advance directive? No - Patient declined No - Patient declined - No - patient declined information No - patient declined information    Tobacco Social History   Tobacco Use  Smoking Status Never Smoker  Smokeless Tobacco Never Used     Counseling given: Not Answered   Clinical Intake:  Pre-visit preparation completed: Yes  Pain : No/denies pain     Nutritional Risks: None Diabetes: No  How often do you need to have someone help you when you read instructions, pamphlets, or other written materials from your doctor or pharmacy?: 1 - Never What is the last grade level you completed in school?: 12th  Interpreter Needed?: No  Information entered by :: CJohnson, LPN  Past Medical History:  Diagnosis Date  . Breast cancer  ([place]) 04/2003   Invasive ductal carcinoma of the right breast  . Complication of anesthesia    "slow to wake up"  . Hard of hearing   . Hypertension   . Osteopenia    Past Surgical History:  Procedure Laterality Date  . BREAST SURGERY  2005   LUMPECTOMY  . EPISIOTOMY    . HYSTEROSCOPY WITH D & C    . LYMPH NODE BIOPSY N/A 06/13/2015   Procedure: SENTINEL  LYMPH NODE BIOPSY;  Surgeon: Janie Morning, MD;  [facility];  Service: Gynecology;  Laterality: N/A;  . PROCTOSCOPY    . PROCTOSOMY REPAIR    . ROBOTIC ASSISTED TOTAL HYSTERECTOMY N/A 06/13/2015   Procedure: XI ROBOTIC ASSISTED TOTAL ABDOMINAL LAPAROSCOPIC HYSTERECTOMY;  Surgeon: Janie Morning, MD;  [facility];  Service: Gynecology;  Laterality: N/A;  . SALPINGOOPHORECTOMY Bilateral 06/13/2015   Procedure: SALPINGO OOPHORECTOMY;  Surgeon: Janie Morning, MD;  [facility];  Service: Gynecology;  Laterality: Bilateral;  . STAPENDECTOMY     BILATERAL   Family History  Problem Relation Age of Onset  . Stroke Father   . Hypertension Mother   . Stroke Sister   . Cancer Brother        Esophageal cancer  . Cancer Brother        Lung smoker  . Parkinson's disease Brother   . Stroke Brother    Social History   Socioeconomic History  . Marital status: Married    Spouse name: Not on file  .  Number of children: Not on file  . Years of education: Not on file  . Highest education level: Not on file  Occupational History  . Occupation: retired    Fish farm manager: Retired  Immunologist  . Smoking status: Never Smoker  . Smokeless tobacco: Never Used  Substance and Sexual Activity  . Alcohol use: No  . Drug use: No  . Sexual activity: Yes    Birth control/protection: Post-menopausal  Other Topics Concern  . Not on file  Social History Narrative   Regular exercise--no, occ goes to [employer]   Diet: healthy, fruits and veggies   No living will, no HCPOA (reviewed 2013)   Social Determinants of Health   Financial Resource  Strain: Low Risk   . Difficulty of Paying Living Expenses: Not hard at all  Food Insecurity: No Food Insecurity  . Worried About Charity fundraiser in the Last Year: Never true  . Ran Out of Food in the Last Year: Never true  Transportation Needs: No Transportation Needs  . Lack of Transportation (Medical): No  . Lack of Transportation (Non-Medical): No  Physical Activity: Sufficiently Active  . Days of Exercise per Week: 7 days  . Minutes of Exercise per Session: 30 min  Stress: No Stress Concern Present  . Feeling of Stress : Not at all  Social Connections:   . Frequency of Communication with Friends and Family:   . Frequency of Social Gatherings with Friends and Family:   . Attends Religious Services:   . Active Member of Clubs or Organizations:   . Attends Archivist Meetings:   Marland Kitchen Marital Status:     Outpatient Encounter Medications as of 04/15/2019  Medication Sig  . Cholecalciferol 1000 UNITS capsule Take 1,000 Units by mouth every morning.   . Cyanocobalamin (B-12 PO) Take 1 tablet by mouth daily.   . Flaxseed, Linseed, (FLAX SEED OIL) 1000 MG CAPS Take 1 capsule by mouth daily.  . hydrochlorothiazide (HYDRODIURIL) 12.5 MG tablet TAKE 1 TABLET BY MOUTH DAILY  . losartan (COZAAR) 50 MG tablet TAKE 1 TABLET BY MOUTH DAILY  . Red Yeast Rice Extract (RED YEAST RICE PO) Take 1,200 mg by mouth every morning.   . sodium fluoride-calcium carbonate (FLORICAL) 8.3-364 MG CAPS Take 1 capsule by mouth 2 (two) times daily.  . TURMERIC PO Take by mouth.   No facility-administered encounter medications on file as of 04/15/2019.    Activities of Daily Living In your present state of health, do you have any difficulty performing the following activities: 04/15/2019  Hearing? Y  Comment wears hearing aids  Vision? N  Difficulty concentrating or making decisions? N  Walking or climbing stairs? N  Dressing or bathing? N  Doing errands, shopping? N  Preparing Food and eating ?  N  Using the Toilet? N  In the past six months, have you accidently leaked urine? N  Do you have problems with loss of bowel control? N  Managing your Medications? N  Managing your Finances? N  Housekeeping or managing your Housekeeping? N  Some recent data might be hidden    Patient Care Team: Jinny Sanders, MD as PCP - General Thelma Comp, [place] as Consulting Physician (Optometry) Everitt Amber, MD as Consulting Physician (Obstetrics and Gynecology) Allyn Kenner, MD as Consulting Physician (Dermatology) Bobbye Charleston, MD as Consulting Physician (Obstetrics and Gynecology)    Assessment:   This is a routine wellness examination for [name].  Exercise Activities and Dietary recommendations Current Exercise  Habits: Home exercise routine, Type of exercise: walking, Time (Minutes): 30, Frequency (Times/Week): 7, Weekly Exercise (Minutes/Week): 210, Intensity: Moderate, Exercise limited by: None identified  Goals    . Increase physical activity     Starting 03/04/2016, I will continue to walk at least 15-20 min 5 days per week.     . Increase physical activity     Starting 03/05/2017, I will continue to walk at least 15 minutes 5 days per week.     . Patient Stated     04/15/2019, I will continue to walk everyday for about 30 minutes.       Fall Risk Fall Risk  04/15/2019 03/20/2018 03/05/2017 03/04/2016 03/03/2015  Falls in the past year? 0 0 No No No  Number falls in past yr: 0 - - - -  Injury with Fall? 0 - - - -  Risk for fall due to : Medication side effect - - - -  Follow up Falls evaluation completed;Falls prevention discussed - - - -   Is the patient's home free of loose throw rugs in walkways, pet beds, electrical cords, etc?   yes      Grab bars in the bathroom? no      Handrails on the stairs?   yes      Adequate lighting?   yes  Timed Get Up and Go performed: N/A  Depression Screen PHQ 2/9 Scores 04/15/2019 03/20/2018 03/05/2017 03/04/2016  PHQ - 2 Score 0 0 0 0    PHQ- 9 Score 0 - 0 -     Cognitive Function MMSE - Mini Mental State Exam 04/15/2019 03/05/2017 03/04/2016  Orientation to time 5 5 5   Orientation to Place 5 5 5   Registration 3 3 3   Attention/ Calculation 5 0 0  Recall 3 3 3   Language- name 2 objects - 0 0  Language- repeat 1 1 1   Language- follow 3 step command - 3 3  Language- read & follow direction - 0 0  Write a sentence - 0 0  Copy design - 0 0  Total score - 20 20  Mini Cog  Mini-Cog screen was completed. Maximum score is 22. A value of 0 denotes this part of the MMSE was not completed or the patient failed this part of the Mini-Cog screening.       Immunization History  Administered Date(s) Administered  . Fluad Quad(high Dose 65+) 10/07/2018  . Influenza, High Dose Seasonal PF 11/20/2016  . Influenza, Seasonal, Injecte, Preservative Fre 01/14/2012, 11/14/2015  . Influenza,inj,Quad PF,6+ Mos 11/24/2012, 12/23/2013, 11/24/2014, 12/18/2017  . Pneumococcal Conjugate-13 12/23/2013  . Pneumococcal Polysaccharide-23 03/31/2003, 03/03/2015  . Td 02/04/1973, 02/17/2007  . Zoster 03/24/2007  . Zoster Recombinat (Shingrix) 11/18/2018, 11/30/2018    Qualifies for Shingles Vaccine: Completed series   Screening Tests Health Maintenance  Topic Date Due  . TETANUS/TDAP  04/15/2023 (Originally 02/16/2017)  . MAMMOGRAM  01/26/2020  . INFLUENZA VACCINE  Completed  . DEXA SCAN  Completed  . PNA vac Low Risk Adult  Completed    Cancer Screenings: Lung: Low Dose CT Chest recommended if Age 64-80 years, 30 pack-year currently smoking OR have quit w/in 15 years. Patient does not qualify. Breast:  Up to date on Mammogram: Yes, completed 01/26/2019   Up to date of Bone Density/Dexa: Yes, completed 08/13/2018 Colorectal: no longer required  Additional Screenings:  Hepatitis C Screening: N/A     Plan:   Patient will continue to walk everyday for about 30 minutes.  I have personally reviewed and noted the following in the  patient's chart:   . Medical and social history . Use of alcohol, tobacco or illicit drugs  . Current medications and supplements . Functional ability and status . Nutritional status . Physical activity . Advanced directives . List of other physicians . Hospitalizations, surgeries, and ER visits in previous 12 months . Vitals . Screenings to include cognitive, depression, and falls . Referrals and appointments  In addition, I have reviewed and discussed with patient certain preventive protocols, quality metrics, and best practice recommendations. A written personalized care plan for preventive services as well as general preventive health recommendations were provided to patient.     Andrez Grime, LPN  [phone]

## 2019-04-15 NOTE — Progress Notes (Signed)
PCP notes:  Health Maintenance: Tdap- insurance/financial   Abnormal Screenings: none   Patient concerns: none   Nurse concerns: none   Next PCP appt: 04/22/2019 @ 11 am

## 2019-04-15 NOTE — Patient Instructions (Signed)
[name] , Thank you for taking time to come for your Medicare Wellness Visit. I appreciate your ongoing commitment to your health goals. Please review the following plan we discussed and let me know if I can assist you in the future.   Screening recommendations/referrals: Colonoscopy: no longer required Mammogram: Up to date, completed 01/26/2019 Bone Density: Up to date, completed 08/13/2018 Recommended yearly ophthalmology/optometry visit for glaucoma screening and checkup Recommended yearly dental visit for hygiene and checkup  Vaccinations: Influenza vaccine: Up to date, completed 10/07/2018 Pneumococcal vaccine: Completed series Tdap vaccine: decline Shingles vaccine: Completed series    Advanced directives: Advance directive discussed with you today. Even though you declined this today please call our office should you change your mind and we can give you the proper paperwork for you to fill out.  Conditions/risks identified: hypertension  Next appointment: 04/22/2019 @ 11 am    Preventive Care 65 Years and Older, Female Preventive care refers to lifestyle choices and visits with your health care provider that can promote health and wellness. What does preventive care include?  A yearly physical exam. This is also called an annual well check.  Dental exams once or twice a year.  Routine eye exams. Ask your health care provider how often you should have your eyes checked.  Personal lifestyle choices, including:  Daily care of your teeth and gums.  Regular physical activity.  Eating a healthy diet.  Avoiding tobacco and drug use.  Limiting alcohol use.  Practicing safe sex.  Taking low-dose aspirin every day.  Taking vitamin and mineral supplements as recommended by your health care provider. What happens during an annual well check? The services and screenings done by your health care provider during your annual well check will depend on your age, overall health,  lifestyle risk factors, and family history of disease. Counseling  Your health care provider may ask you questions about your:  Alcohol use.  Tobacco use.  Drug use.  Emotional well-being.  Home and relationship well-being.  Sexual activity.  Eating habits.  History of falls.  Memory and ability to understand (cognition).  Work and work Statistician.  Reproductive health. Screening  You may have the following tests or measurements:  Height, weight, and BMI.  Blood pressure.  Lipid and cholesterol levels. These may be checked every 5 years, or more frequently if you are over 48 years old.  Skin check.  Lung cancer screening. You may have this screening every year starting at age 66 if you have a 30-pack-year history of smoking and currently smoke or have quit within the past 15 years.  Fecal occult blood test (FOBT) of the stool. You may have this test every year starting at age 106.  Flexible sigmoidoscopy or colonoscopy. You may have a sigmoidoscopy every 5 years or a colonoscopy every 10 years starting at age 75.  Hepatitis C blood test.  Hepatitis B blood test.  Sexually transmitted disease (STD) testing.  Diabetes screening. This is done by checking your blood sugar (glucose) after you have not eaten for a while (fasting). You may have this done every 1-3 years.  Bone density scan. This is done to screen for osteoporosis. You may have this done starting at age 39.  Mammogram. This may be done every 1-2 years. Talk to your health care provider about how often you should have regular mammograms. Talk with your health care provider about your test results, treatment options, and if necessary, the need for more tests. Vaccines  Your health care provider may recommend certain vaccines, such as:  Influenza vaccine. This is recommended every year.  Tetanus, diphtheria, and acellular pertussis (Tdap, Td) vaccine. You may need a Td booster every 10 years.  Zoster  vaccine. You may need this after age 66.  Pneumococcal 13-valent conjugate (PCV13) vaccine. One dose is recommended after age 21.  Pneumococcal polysaccharide (PPSV23) vaccine. One dose is recommended after age 70. Talk to your health care provider about which screenings and vaccines you need and how often you need them. This information is not intended to replace advice given to you by your health care provider. Make sure you discuss any questions you have with your health care provider. Document Released: 02/17/2015 Document Revised: 10/11/2015 Document Reviewed: 11/22/2014 Elsevier Interactive Patient Education  2017 [employer] Prevention in the Home Falls can cause injuries. They can happen to people of all ages. There are many things you can do to make your home safe and to help prevent falls. What can I do on the outside of my home?  Regularly fix the edges of walkways and driveways and fix any cracks.  Remove anything that might make you trip as you walk through a door, such as a raised step or threshold.  Trim any bushes or trees on the path to your home.  Use bright outdoor lighting.  Clear any walking paths of anything that might make someone trip, such as rocks or tools.  Regularly check to see if handrails are loose or broken. Make sure that both sides of any steps have handrails.  Any raised decks and porches should have guardrails on the edges.  Have any leaves, snow, or ice cleared regularly.  Use sand or salt on walking paths during winter.  Clean up any spills in your garage right away. This includes oil or grease spills. What can I do in the bathroom?  Use night lights.  Install grab bars by the toilet and in the tub and shower. Do not use towel bars as grab bars.  Use non-skid mats or decals in the tub or shower.  If you need to sit down in the shower, use a plastic, non-slip stool.  Keep the floor dry. Clean up any water that spills on the  floor as soon as it happens.  Remove soap buildup in the tub or shower regularly.  Attach bath mats securely with double-sided non-slip rug tape.  Do not have throw rugs and other things on the floor that can make you trip. What can I do in the bedroom?  Use night lights.  Make sure that you have a light by your bed that is easy to reach.  Do not use any sheets or blankets that are too big for your bed. They should not hang down onto the floor.  Have a firm chair that has side arms. You can use this for support while you get dressed.  Do not have throw rugs and other things on the floor that can make you trip. What can I do in the kitchen?  Clean up any spills right away.  Avoid walking on wet floors.  Keep items that you use a lot in easy-to-reach places.  If you need to reach something above you, use a strong step stool that has a grab bar.  Keep electrical cords out of the way.  Do not use floor polish or wax that makes floors slippery. If you must use wax, use non-skid floor wax.  Do  not have throw rugs and other things on the floor that can make you trip. What can I do with my stairs?  Do not leave any items on the stairs.  Make sure that there are handrails on both sides of the stairs and use them. Fix handrails that are broken or loose. Make sure that handrails are as long as the stairways.  Check any carpeting to make sure that it is firmly attached to the stairs. Fix any carpet that is loose or worn.  Avoid having throw rugs at the top or bottom of the stairs. If you do have throw rugs, attach them to the floor with carpet tape.  Make sure that you have a light switch at the top of the stairs and the bottom of the stairs. If you do not have them, ask someone to add them for you. What else can I do to help prevent falls?  Wear shoes that:  Do not have high heels.  Have rubber bottoms.  Are comfortable and fit you well.  Are closed at the toe. Do not wear  sandals.  If you use a stepladder:  Make sure that it is fully opened. Do not climb a closed stepladder.  Make sure that both sides of the stepladder are locked into place.  Ask someone to hold it for you, if possible.  Clearly mark and make sure that you can see:  Any grab bars or handrails.  First and last steps.  Where the edge of each step is.  Use tools that help you move around (mobility aids) if they are needed. These include:  Canes.  Walkers.  Scooters.  Crutches.  Turn on the lights when you go into a dark area. Replace any light bulbs as soon as they burn out.  Set up your furniture so you have a clear path. Avoid moving your furniture around.  If any of your floors are uneven, fix them.  If there are any pets around you, be aware of where they are.  Review your medicines with your doctor. Some medicines can make you feel dizzy. This can increase your chance of falling. Ask your doctor what other things that you can do to help prevent falls. This information is not intended to replace advice given to you by your health care provider. Make sure you discuss any questions you have with your health care provider. Document Released: 11/17/2008 Document Revised: 06/29/2015 Document Reviewed: 02/25/2014 Elsevier Interactive Patient Education  2017 [employer].

## 2019-04-16 NOTE — Progress Notes (Signed)
No critical labs need to be addressed urgently. We will discuss labs in detail at upcoming office visit.   

## 2019-04-22 ENCOUNTER — Encounter: Payer: Self-pay | Admitting: Family Medicine

## 2019-04-22 ENCOUNTER — Other Ambulatory Visit: Payer: Self-pay

## 2019-04-22 ENCOUNTER — Ambulatory Visit (INDEPENDENT_AMBULATORY_CARE_PROVIDER_SITE_OTHER): Payer: Medicare Other | Admitting: Family Medicine

## 2019-04-22 VITALS — BP 124/80 | HR 90 | Temp 98.0°F | Ht 60.75 in | Wt 129.8 lb

## 2019-04-22 DIAGNOSIS — R0982 Postnasal drip: Secondary | ICD-10-CM

## 2019-04-22 DIAGNOSIS — I10 Essential (primary) hypertension: Secondary | ICD-10-CM

## 2019-04-22 DIAGNOSIS — M7062 Trochanteric bursitis, left hip: Secondary | ICD-10-CM

## 2019-04-22 DIAGNOSIS — Z0000 Encounter for general adult medical examination without abnormal findings: Secondary | ICD-10-CM

## 2019-04-22 DIAGNOSIS — E7800 Pure hypercholesterolemia, unspecified: Secondary | ICD-10-CM

## 2019-04-22 DIAGNOSIS — R944 Abnormal results of kidney function studies: Secondary | ICD-10-CM

## 2019-04-22 MED ORDER — FLUTICASONE PROPIONATE 50 MCG/ACT NA SUSP: 2.0000 | Freq: Every day | NASAL | 6 refills | Status: DC

## 2019-04-22 NOTE — Assessment & Plan Note (Signed)
No current flare. If flares up instead of NSAIDS first try tyelnol  Or topical voltaren.

## 2019-04-22 NOTE — Patient Instructions (Addendum)
Keep up with fluids. Avoid aleve or ibuprofen. Keep up with walking. Can try lower dose of red yeast rice if any side effects. Start fluticasone 2 sprays per nostril  For post nasal drip. Could also try claritin for allergies.  Try  acetaminophen 500 mg 2 tabs three times daily or Voltaren gel topically for right hip pain... if not improving call for possible refill of Meloxicam.

## 2019-04-22 NOTE — Assessment & Plan Note (Signed)
Restart flonase and claritin.

## 2019-04-22 NOTE — Assessment & Plan Note (Signed)
Stable

## 2019-04-22 NOTE — Progress Notes (Signed)
Chief Complaint  Patient presents with  . Annual Exam    Part 2    History of Present Illness: HPI  The patient presents for complete physical and review of chronic health problems. He/She also has the following acute concerns today: She has had new post nasal drip, white mucus. Hoarse voice later in day noted in the last several months... constant.  Has not tried any medications.   No heartburn.   The patient saw a LPN or RN for medicare wellness visit.  Prevention and wellness was reviewed in detail. Note reviewed and important notes copied below.  Health Maintenance: Tdap- insurance/financial Abnormal Screenings: none  04/22/19  Hypertension:   At goal on losartan HCTZ   BP Readings from Last 3 Encounters:  04/22/19 124/80  09/09/18 130/70  09/01/18 120/64  Using medication without problems or lightheadedness: none Chest pain with exertion:none Edema:occ Short of breath:noneAverage home BPs: Other issues:  Elevated Cholesterol: improved control back on  red yeast rice,flaxseed Pt remains not interested in prescription statin. Lab Results  Component Value Date   CHOL 190 04/15/2019   HDL 49.40 04/15/2019   LDLCALC 118 (H) 04/15/2019   LDLDIRECT 148.1 11/17/2012   TRIG 113.0 04/15/2019   CHOLHDL 4 04/15/2019  Using medications without problems: Muscle aches:  Diet compliance: good Exercise: walking off and on Other complaints:  Hx of endometrial cancer Dx 2017: Stage 1A Grade 2 endometrial cancer S/p robotic assisted total hysterectomy, BSO, sentinel lymph node mappingon 06/13/15. noLVSI, 40% myometrial invasion, negativelymph nodes. Followed in past  by Dr. Denman George. Surgeon Dr. Skeet Latch.   This visit occurred during the SARS-CoV-2 public health emergency.  Safety protocols were in place, including screening questions prior to the visit, additional usage of staff PPE, and extensive cleaning of exam room while observing appropriate contact time as  indicated for disinfecting solutions.   COVID 19 screen:  No recent travel or known exposure to COVID19 The patient denies respiratory symptoms of COVID 19 at this time. The importance of social distancing was discussed today.     Review of Systems  Constitutional: Negative for chills and fever.  HENT: Negative for congestion and ear pain.   Eyes: Negative for pain and redness.  Respiratory: Negative for cough and shortness of breath.   Cardiovascular: Negative for chest pain, palpitations and leg swelling.  Gastrointestinal: Negative for abdominal pain, blood in stool, constipation, diarrhea, nausea and vomiting.  Genitourinary: Negative for dysuria.  Musculoskeletal: Negative for falls and myalgias.  Skin: Negative for rash.  Neurological: Negative for dizziness.  Psychiatric/Behavioral: Negative for depression. The patient is not nervous/anxious.       Past Medical History:  Diagnosis Date  . Breast cancer ([place]) 04/2003   Invasive ductal carcinoma of the right breast  . Complication of anesthesia    "slow to wake up"  . Hard of hearing   . Hypertension   . Osteopenia     reports that she has never smoked. She has never used smokeless tobacco. She reports that she does not drink alcohol or use drugs.   Current Outpatient Medications:  .  Cholecalciferol 1000 UNITS capsule, Take 1,000 Units by mouth every morning. , Disp: , Rfl:  .  Cyanocobalamin (B-12 PO), Take 1 tablet by mouth daily. , Disp: , Rfl:  .  Flaxseed, Linseed, (FLAX SEED OIL) 1000 MG CAPS, Take 1 capsule by mouth daily., Disp: , Rfl:  .  hydrochlorothiazide (HYDRODIURIL) 12.5 MG tablet, TAKE 1 TABLET BY MOUTH DAILY, Disp:  90 tablet, Rfl: 0 .  losartan (COZAAR) 50 MG tablet, TAKE 1 TABLET BY MOUTH DAILY, Disp: 90 tablet, Rfl: 0 .  Red Yeast Rice Extract (RED YEAST RICE PO), Take 1,200 mg by mouth every morning. , Disp: , Rfl:  .  sodium fluoride-calcium carbonate (FLORICAL) 8.3-364 MG CAPS, Take 1 capsule by  mouth 2 (two) times daily., Disp: , Rfl:  .  TURMERIC PO, Take by mouth., Disp: , Rfl:    Observations/Objective: Blood pressure 124/80, pulse 90, temperature 98 F (36.7 C), temperature source Temporal, height 5' 0.75" (1.543 m), weight 129 lb 12 oz (58.9 kg), SpO2 95 %.  Physical Exam Constitutional:      General: She is not in acute distress.    Appearance: Normal appearance. She is well-developed. She is not ill-appearing or toxic-appearing.     Comments: Elderly in NAD  HENT:     Head: Normocephalic.     Right Ear: Hearing, tympanic membrane, ear canal and external ear normal.     Left Ear: Hearing, tympanic membrane, ear canal and external ear normal.     Nose: Nose normal.  Eyes:     General: Lids are normal. Lids are everted, no foreign bodies appreciated.     Conjunctiva/sclera: Conjunctivae normal.     Pupils: Pupils are equal, round, and reactive to light.  Neck:     Thyroid: No thyroid mass or thyromegaly.     Vascular: No carotid bruit.     Trachea: Trachea normal.  Cardiovascular:     Rate and Rhythm: Normal rate and regular rhythm.     Heart sounds: Normal heart sounds, S1 normal and S2 normal. No murmur. No gallop.   Pulmonary:     Effort: Pulmonary effort is normal. No respiratory distress.     Breath sounds: Normal breath sounds. No wheezing, rhonchi or rales.  Abdominal:     General: Bowel sounds are normal. There is no distension or abdominal bruit.     Palpations: Abdomen is soft. There is no fluid wave or mass.     Tenderness: There is no abdominal tenderness. There is no guarding or rebound.     Hernia: No hernia is present.  Musculoskeletal:     Cervical back: Normal range of motion and neck supple.  Lymphadenopathy:     Cervical: No cervical adenopathy.  Skin:    General: Skin is warm and dry.     Findings: No rash.  Neurological:     Mental Status: She is alert.     Cranial Nerves: No cranial nerve deficit.     Sensory: No sensory deficit.   Psychiatric:        Mood and Affect: Mood is not anxious or depressed.        Speech: Speech normal.        Behavior: Behavior normal. Behavior is cooperative.        Judgment: Judgment normal.      Assessment and Plan   The patient's preventative maintenance and recommended screening tests for an annual wellness exam were reviewed in full today. Brought up to date unless services declined.  Counselled on the importance of diet, exercise, and its role in overall health and mortality. The patient's FH and SH was reviewed, including their home life, tobacco status, and drug and alcohol status.   TAH Hx of endometrial cancer Vaccines:uptodate except due for td. Received COVID19 vaccine Colonoscopy: 06/2005,not indicated due to age Mammo:12/2020hx of breast cancer DEXA:  08/2018:stable osteopenia Non smoker.  Eliezer Lofts, MD  Essential hypertension, benign Well controlled. Continue current medication.   HYPERCHOLESTEROLEMIA Well controlled. Continue current medication.   Trochanteric bursitis of left hip No current flare. If flares up instead of NSAIDS first try tyelnol  Or topical voltaren.  Post-nasal drip Restart flonase and claritin.  Decreased GFR Stable.

## 2019-04-22 NOTE — Assessment & Plan Note (Signed)
Well controlled. Continue current medication.  

## 2019-07-12 ENCOUNTER — Other Ambulatory Visit: Payer: Self-pay | Admitting: Family Medicine

## 2019-07-12 MED ORDER — LOSARTAN POTASSIUM-HCTZ 50-12.5 MG PO TABS: 1.0000 | ORAL_TABLET | Freq: Every day | ORAL | 1 refills | Status: DC

## 2019-08-25 DIAGNOSIS — Z961 Presence of intraocular lens: Secondary | ICD-10-CM | POA: Diagnosis not present

## 2019-10-07 ENCOUNTER — Other Ambulatory Visit: Payer: Self-pay | Admitting: Family Medicine

## 2019-10-14 ENCOUNTER — Encounter: Payer: Self-pay | Admitting: Family Medicine

## 2019-10-14 ENCOUNTER — Ambulatory Visit (INDEPENDENT_AMBULATORY_CARE_PROVIDER_SITE_OTHER): Payer: Medicare Other | Admitting: Family Medicine

## 2019-10-14 ENCOUNTER — Other Ambulatory Visit: Payer: Self-pay

## 2019-10-14 VITALS — BP 150/80 | HR 81 | Temp 97.7°F | Ht 60.75 in | Wt 128.5 lb

## 2019-10-14 DIAGNOSIS — S66411A Strain of intrinsic muscle, fascia and tendon of right thumb at wrist and hand level, initial encounter: Secondary | ICD-10-CM

## 2019-10-14 NOTE — Progress Notes (Signed)
Angela Schroth T. Rianne Degraaf, MD, [place] at [facility] [place], [address]  Phone: [phone]   FAX: [place] - 79 y.o. female   MRN [id number]   Date of Birth: 27-Sep-1940  Date: 10/14/2019   PCP: Jinny Sanders, MD   Referral: Jinny Sanders, MD  Chief Complaint  Patient presents with   Hand Pain    Right Thumb    This visit occurred during the SARS-CoV-2 public health emergency.  Safety protocols were in place, including screening questions prior to the visit, additional usage of staff PPE, and extensive cleaning of exam room while observing appropriate contact time as indicated for disinfecting solutions.   Subjective:   [name] is a 79 y.o. very pleasant female patient with Body mass index is 24.48 kg/m. who presents with the following:  Date of injury: October 06, 2019  Last wed bent over and had some severe pain.  Could not move her R thumb all that much.  Husband had some gout - colchicine - did not help, because she thought it might be gout.  It has progressively been getting worse, and it seemed to help quite a bit using some Voltaren gel.  She is still having some pain on the volar aspect of the left thumb.  This is proximal to the MCP joint.  Has been using voltaren gel.    Getting better  strain  Review of Systems is noted in the HPI, as appropriate   Objective:   BP (!) 150/80    Pulse 81    Temp 97.7 F (36.5 C) (Temporal)    Ht 5' 0.75" (1.543 m)    Wt 128 lb 8 oz (58.3 kg)    SpO2 95%    BMI 24.48 kg/m    GEN: No acute distress; alert,appropriate. PULM: Breathing comfortably in no respiratory distress PSYCH: Normally interactive.    Full range of motion of all digits in the right hand.  Full extension, flexion, ulnar deviation and radial deviation.  She does have some pain with terminal movements.  Grip is fully intact.  She does  have pain at the thenar eminence  Radiology: No results found.  Assessment and Plan:     ICD-10-CM   1. Strain of intrinsic muscle, fascia and tendon of right thumb at wrist and hand level, initial encounter  S66.411A    Reassurance, it is improving.  Some pain at the thenar eminence is still present.  Continue with Voltaren gel and basic range of motion.  Follow-up: No follow-ups on file.  No orders of the defined types were placed in this encounter.  There are no discontinued medications. No orders of the defined types were placed in this encounter.   Signed,  Maud Deed. Dawan Farney, MD   Outpatient Encounter Medications as of 10/14/2019  Medication Sig   Cholecalciferol 1000 UNITS capsule Take 1,000 Units by mouth every morning.    Cyanocobalamin (B-12 PO) Take 1 tablet by mouth daily.    Flaxseed, Linseed, (FLAX SEED OIL) 1000 MG CAPS Take 1 capsule by mouth daily.   fluticasone (FLONASE) 50 MCG/ACT nasal spray Place 2 sprays into both nostrils daily.   losartan-hydrochlorothiazide (HYZAAR) 50-12.5 MG tablet TAKE 1 TABLET BY MOUTH DAILY   Red Yeast Rice Extract (RED YEAST RICE PO) Take 1,200 mg by mouth every morning.    sodium fluoride-calcium carbonate (FLORICAL) 8.3-364 MG  CAPS Take 1 capsule by mouth 2 (two) times daily.   TURMERIC PO Take by mouth.   No facility-administered encounter medications on file as of 10/14/2019.

## 2020-03-15 ENCOUNTER — Other Ambulatory Visit: Payer: Self-pay | Admitting: Family Medicine

## 2020-03-15 DIAGNOSIS — Z1231 Encounter for screening mammogram for malignant neoplasm of breast: Secondary | ICD-10-CM

## 2020-04-12 ENCOUNTER — Other Ambulatory Visit: Payer: Self-pay | Admitting: Family Medicine

## 2020-04-27 ENCOUNTER — Telehealth: Payer: Self-pay | Admitting: Family Medicine

## 2020-04-27 DIAGNOSIS — E7800 Pure hypercholesterolemia, unspecified: Secondary | ICD-10-CM

## 2020-04-27 NOTE — Telephone Encounter (Signed)
-----   Message from Ellamae Sia sent at 04/24/2020  2:06 PM EDT ----- Regarding: Lab orders for Wednesday, 4.6.22 Patient is scheduled for CPX labs, please order future labs, Thanks , Karna Christmas

## 2020-05-04 ENCOUNTER — Ambulatory Visit: Payer: Medicare Other

## 2020-05-10 ENCOUNTER — Other Ambulatory Visit (INDEPENDENT_AMBULATORY_CARE_PROVIDER_SITE_OTHER): Payer: Medicare Other

## 2020-05-10 ENCOUNTER — Other Ambulatory Visit: Payer: Self-pay

## 2020-05-10 ENCOUNTER — Ambulatory Visit (INDEPENDENT_AMBULATORY_CARE_PROVIDER_SITE_OTHER): Payer: Medicare Other

## 2020-05-10 DIAGNOSIS — E7800 Pure hypercholesterolemia, unspecified: Secondary | ICD-10-CM | POA: Diagnosis not present

## 2020-05-10 DIAGNOSIS — Z0000 Encounter for general adult medical examination without abnormal findings: Secondary | ICD-10-CM

## 2020-05-10 LAB — COMPREHENSIVE METABOLIC PANEL
ALT: 14 U/L (ref 0–35)
AST: 18 U/L (ref 0–37)
Albumin: 4.3 g/dL (ref 3.5–5.2)
Alkaline Phosphatase: 49 U/L (ref 39–117)
BUN: 21 mg/dL (ref 6–23)
CO2: 28 mEq/L (ref 19–32)
Calcium: 9.7 mg/dL (ref 8.4–10.5)
Chloride: 102 mEq/L (ref 96–112)
Creatinine, Ser: 0.9 mg/dL (ref 0.40–1.20)
GFR: 59.72 mL/min — ABNORMAL LOW (ref >60.00)
Glucose, Bld: 84 mg/dL (ref 70–99)
Potassium: 3.8 mEq/L (ref 3.5–5.1)
Sodium: 139 mEq/L (ref 135–145)
Total Bilirubin: 0.6 mg/dL (ref 0.2–1.2)
Total Protein: 7 g/dL (ref 6.0–8.3)

## 2020-05-10 LAB — LIPID PANEL
Cholesterol: 233 mg/dL — ABNORMAL HIGH (ref 0–200)
HDL: 56.6 mg/dL (ref >39.00)
LDL Cholesterol: 158 mg/dL — ABNORMAL HIGH (ref 0–99)
NonHDL: 176.29
Total CHOL/HDL Ratio: 4
Triglycerides: 91 mg/dL (ref 0.0–149.0)
VLDL: 18.2 mg/dL (ref 0.0–40.0)

## 2020-05-10 NOTE — Progress Notes (Signed)
PCP notes:  Health Maintenance: Covid booster- due   Abnormal Screenings: none   Patient concerns: Discuss allergies   Nurse concerns: none   Next PCP appt.: 05/23/2020 @ 11 am

## 2020-05-10 NOTE — Progress Notes (Signed)
Subjective:   [name] is a 80 y.o. female who presents for Medicare Annual (Subsequent) preventive examination.  Review of Systems: N/A      I connected with the patient today by telephone and verified that I am speaking with the correct person using two identifiers. Location patient: home Location nurse: work Persons participating in the telephone visit: patient, nurse.   I discussed the limitations, risks, security and privacy concerns of performing an evaluation and management service by telephone and the availability of in person appointments. I also discussed with the patient that there may be a patient responsible charge related to this service. The patient expressed understanding and verbally consented to this telephonic visit.        Cardiac Risk Factors include: advanced age (>53men, >41 women);hypertension;Other (see comment), Risk factor comments: hypercholesterolemia     Objective:    Today's Vitals   05/10/20 0939  PainSc: 8    There is no height or weight on file to calculate BMI.  Advanced Directives 05/10/2020 04/15/2019 03/05/2017 03/04/2016 06/13/2015 06/09/2015  Does Patient Have a Medical Advance Directive? No No No Yes No No  Type of Advance Directive - - - Hardy in Chart? - - - No - copy requested - -  Would patient like information on creating a medical advance directive? No - Patient declined No - Patient declined No - Patient declined - No - patient declined information No - patient declined information    Current Medications (verified) Outpatient Encounter Medications as of 05/10/2020  Medication Sig  . Cholecalciferol 1000 UNITS capsule Take 1,000 Units by mouth 2 (two) times daily.  . Cyanocobalamin (B-12 PO) Take 1 tablet by mouth daily.   . Flaxseed, Linseed, (FLAX SEED OIL) 1000 MG CAPS Take 1 capsule by mouth daily.  . fluticasone (FLONASE) 50 MCG/ACT nasal spray Place 2 sprays into  both nostrils daily.  Marland Kitchen losartan-hydrochlorothiazide (HYZAAR) 50-12.5 MG tablet TAKE 1 TABLET BY MOUTH DAILY  . Red Yeast Rice Extract (RED YEAST RICE PO) Take 1,200 mg by mouth every morning.   . sodium fluoride-calcium carbonate (FLORICAL) 8.3-364 MG CAPS Take 1 capsule by mouth 2 (two) times daily.  . TURMERIC PO Take by mouth.   No facility-administered encounter medications on file as of 05/10/2020.    Allergies (verified) Morphine and related and Penicillins   History: Past Medical History:  Diagnosis Date  . Breast cancer ([place]) 04/2003   Invasive ductal carcinoma of the right breast  . Complication of anesthesia    "slow to wake up"  . Hard of hearing   . Hypertension   . Osteopenia    Past Surgical History:  Procedure Laterality Date  . BREAST SURGERY  2005   LUMPECTOMY  . EPISIOTOMY    . HYSTEROSCOPY WITH D & C    . LYMPH NODE BIOPSY N/A 06/13/2015   Procedure: SENTINEL  LYMPH NODE BIOPSY;  Surgeon: Janie Morning, MD;  Location: [facility];  Service: Gynecology;  Laterality: N/A;  . PROCTOSCOPY    . PROCTOSOMY REPAIR    . ROBOTIC ASSISTED TOTAL HYSTERECTOMY N/A 06/13/2015   Procedure: XI ROBOTIC ASSISTED TOTAL ABDOMINAL LAPAROSCOPIC HYSTERECTOMY;  Surgeon: Janie Morning, MD;  Location: [facility];  Service: Gynecology;  Laterality: N/A;  . SALPINGOOPHORECTOMY Bilateral 06/13/2015   Procedure: SALPINGO OOPHORECTOMY;  Surgeon: Janie Morning, MD;  Location: [facility];  Service: Gynecology;  Laterality: Bilateral;  . STAPENDECTOMY  BILATERAL   Family History  Problem Relation Age of Onset  . Stroke Father   . Hypertension Mother   . Stroke Sister   . Cancer Brother        Esophageal cancer  . Cancer Brother        Lung smoker  . Parkinson's disease Brother   . Stroke Brother    Social History   Socioeconomic History  . Marital status: Married    Spouse name: Not on file  . Number of children: Not on file  . Years of education: Not on file  . Highest education level:  Not on file  Occupational History  . Occupation: retired    Fish farm manager: Retired  Immunologist  . Smoking status: Never Smoker  . Smokeless tobacco: Never Used  Vaping Use  . Vaping Use: Never used  Substance and Sexual Activity  . Alcohol use: No  . Drug use: No  . Sexual activity: Yes    Birth control/protection: Post-menopausal  Other Topics Concern  . Not on file  Social History Narrative   Regular exercise--no, occ goes to [employer]   Diet: healthy, fruits and veggies   No living will, no HCPOA (reviewed 2013)   Social Determinants of Health   Financial Resource Strain: Low Risk   . Difficulty of Paying Living Expenses: Not hard at all  Food Insecurity: No Food Insecurity  . Worried About Charity fundraiser in the Last Year: Never true  . Ran Out of Food in the Last Year: Never true  Transportation Needs: No Transportation Needs  . Lack of Transportation (Medical): No  . Lack of Transportation (Non-Medical): No  Physical Activity: Insufficiently Active  . Days of Exercise per Week: 7 days  . Minutes of Exercise per Session: 10 min  Stress: No Stress Concern Present  . Feeling of Stress : Not at all  Social Connections: Not on file    Tobacco Counseling Counseling given: Not Answered   Clinical Intake:  Pre-visit preparation completed: Yes  Pain : 0-10 Pain Score: 8  Pain Type: Chronic pain Pain Location: Leg Pain Orientation: Right Pain Descriptors / Indicators: Aching Pain Onset: More than a month ago Pain Frequency: Intermittent     Nutritional Risks: None Diabetes: No  How often do you need to have someone help you when you read instructions, pamphlets, or other written materials from your doctor or pharmacy?: 1 - Never What is the last grade level you completed in school?: 12th  Diabetic: No Nutrition Risk Assessment:  Has the patient had any N/V/D within the last 2 months?  No  Does the patient have any non-healing wounds?  No  Has the patient  had any unintentional weight loss or weight gain?  No   Diabetes:  Is the patient diabetic?  No  If diabetic, was a CBG obtained today?  N/A Did the patient bring in their glucometer from home?  N/A How often do you monitor your CBG's? N/A.   Financial Strains and Diabetes Management:  Are you having any financial strains with the device, your supplies or your medication? N/A.  Does the patient want to be seen by Chronic Care Management for management of their diabetes?  N/A Would the patient like to be referred to a Nutritionist or for Diabetic Management?  N/A   Interpreter Needed?: No  Information entered by :: CJohnson, LPN   Activities of Daily Living In your present state of health, do you have any difficulty performing  the following activities: 05/10/2020  Hearing? Y  Comment wears hearing aids  Vision? N  Difficulty concentrating or making decisions? N  Walking or climbing stairs? N  Dressing or bathing? N  Doing errands, shopping? N  Preparing Food and eating ? N  Using the Toilet? N  In the past six months, have you accidently leaked urine? N  Do you have problems with loss of bowel control? N  Managing your Medications? N  Managing your Finances? N  Housekeeping or managing your Housekeeping? N  Some recent data might be hidden    Patient Care Team: Jinny Sanders, MD as PCP - General Thelma Comp, [place] as Consulting Physician (Optometry) Everitt Amber, MD as Consulting Physician (Obstetrics and Gynecology) Allyn Kenner, MD as Consulting Physician (Dermatology) Bobbye Charleston, MD as Consulting Physician (Obstetrics and Gynecology)  Indicate any recent Medical Services you may have received from other than [facility] providers in the past year (date may be approximate).     Assessment:   This is a routine wellness examination for Kyeisha.  Hearing/Vision screen  Hearing Screening   125Hz  250Hz  500Hz  1000Hz  2000Hz  3000Hz  4000Hz  6000Hz  8000Hz   Right ear:            Left ear:           Vision Screening Comments: Patient gets annual eye exams   Dietary issues and exercise activities discussed: Current Exercise Habits: Home exercise routine, Type of exercise: walking, Time (Minutes): 10, Frequency (Times/Week): 7, Weekly Exercise (Minutes/Week): 70, Intensity: Moderate, Exercise limited by: None identified  Goals    . Increase physical activity     Starting 03/04/2016, I will continue to walk at least 15-20 min 5 days per week.     . Increase physical activity     Starting 03/05/2017, I will continue to walk at least 15 minutes 5 days per week.     . Patient Stated     04/15/2019, I will continue to walk everyday for about 30 minutes.    . Patient Stated     05/10/2020, I will continue to walk daily for about 10 minutes.       Depression Screen PHQ 2/9 Scores 05/10/2020 04/15/2019 03/20/2018 03/05/2017 03/04/2016 03/03/2015 11/24/2012  PHQ - 2 Score 0 0 0 0 0 0 0  PHQ- 9 Score 0 0 - 0 - - -    Fall Risk Fall Risk  05/10/2020 04/15/2019 03/20/2018 03/05/2017 03/04/2016  Falls in the past year? 0 0 0 No No  Number falls in past yr: 0 0 - - -  Injury with Fall? 0 0 - - -  Risk for fall due to : Medication side effect Medication side effect - - -  Follow up Falls evaluation completed;Falls prevention discussed Falls evaluation completed;Falls prevention discussed - - -    FALL RISK PREVENTION PERTAINING TO THE HOME:  Any stairs in or around the home? Yes  If so, are there any without handrails? No  Home free of loose throw rugs in walkways, pet beds, electrical cords, etc? Yes  Adequate lighting in your home to reduce risk of falls? Yes   ASSISTIVE DEVICES UTILIZED TO PREVENT FALLS:  Life alert? No  Use of a cane, walker or w/c? No  Grab bars in the bathroom? No  Shower chair or bench in shower? No  Elevated toilet seat or a handicapped toilet? No   TIMED UP AND GO:  Was the test performed? N/A telephone visit .  Cognitive Function: MMSE -  Mini Mental State Exam 05/10/2020 04/15/2019 03/05/2017 03/04/2016  Orientation to time 5 5 5 5   Orientation to Place 5 5 5 5   Registration 3 3 3 3   Attention/ Calculation 5 5 0 0  Recall 3 3 3 3   Language- name 2 objects - - 0 0  Language- repeat 1 1 1 1   Language- follow 3 step command - - 3 3  Language- read & follow direction - - 0 0  Write a sentence - - 0 0  Copy design - - 0 0  Total score - - 20 20  Mini Cog  Mini-Cog screen was completed. Maximum score is 22. A value of 0 denotes this part of the MMSE was not completed or the patient failed this part of the Mini-Cog screening.       Immunizations Immunization History  Administered Date(s) Administered  . Fluad Quad(high Dose 65+) 10/07/2018  . Influenza, High Dose Seasonal PF 11/20/2016, 11/30/2019  . Influenza, Seasonal, Injecte, Preservative Fre 01/14/2012, 11/14/2015  . Influenza,inj,Quad PF,6+ Mos 11/24/2012, 12/23/2013, 11/24/2014, 12/18/2017  . PFIZER(Purple Top)SARS-COV-2 Vaccination 03/08/2019, 04/02/2019  . Pneumococcal Conjugate-13 12/23/2013  . Pneumococcal Polysaccharide-23 03/31/2003, 03/03/2015  . Td 02/04/1973, 02/17/2007  . Zoster 03/24/2007  . Zoster Recombinat (Shingrix) 11/18/2018, 11/30/2018    TDAP status: Due, Education has been provided regarding the importance of this vaccine. Advised may receive this vaccine at local pharmacy or Health Dept. Aware to provide a copy of the vaccination record if obtained from local pharmacy or Health Dept. Verbalized acceptance and understanding.  Flu Vaccine status: Up to date  Pneumococcal vaccine status: Up to date  Covid-19 vaccine status: 2 doses completed, Booster due. Patient aware.  Qualifies for Shingles Vaccine? Yes   Zostavax completed Yes   Shingrix Completed?: Yes  Screening Tests Health Maintenance  Topic Date Due  . COVID-19 Vaccine (3 - Pfizer risk 4-dose series) 04/30/2019  . MAMMOGRAM  01/26/2020  . TETANUS/TDAP  04/15/2023 (Originally  02/16/2017)  . INFLUENZA VACCINE  09/04/2020  . DEXA SCAN  Completed  . PNA vac Low Risk Adult  Completed  . HPV VACCINES  Aged Out    Health Maintenance  Health Maintenance Due  Topic Date Due  . COVID-19 Vaccine (3 - Pfizer risk 4-dose series) 04/30/2019  . MAMMOGRAM  01/26/2020    Colorectal cancer screening: No longer required.   Mammogram status: scheduled 06/21/2020  Bone Density status: Completed 08/13/2018. Results reflect: Bone density results: OSTEOPENIA. Repeat every 2 years.  Lung Cancer Screening: (Low Dose CT Chest recommended if Age 44-80 years, 30 pack-year currently smoking OR have quit w/in 15years.) does not qualify.   Additional Screening:  Hepatitis C Screening: does not qualify; Completed N/A  Vision Screening: Recommended annual ophthalmology exams for early detection of glaucoma and other disorders of the eye. Is the patient up to date with their annual eye exam?  Yes  Who is the provider or what is the name of the office in which the patient attends annual eye exams? Dr. Einar Gip  If pt is not established with a provider, would they like to be referred to a provider to establish care? No .   Dental Screening: Recommended annual dental exams for proper oral hygiene  Community Resource Referral / Chronic Care Management: CRR required this visit?  No   CCM required this visit?  No      Plan:     I have personally reviewed and noted the following in  the patient's chart:   . Medical and social history . Use of alcohol, tobacco or illicit drugs  . Current medications and supplements . Functional ability and status . Nutritional status . Physical activity . Advanced directives . List of other physicians . Hospitalizations, surgeries, and ER visits in previous 12 months . Vitals . Screenings to include cognitive, depression, and falls . Referrals and appointments  In addition, I have reviewed and discussed with patient certain preventive  protocols, quality metrics, and best practice recommendations. A written personalized care plan for preventive services as well as general preventive health recommendations were provided to patient.   Due to this being a telephonic visit, the after visit summary with patients personalized plan was offered to patient via office or my-chart. Patient preferred to pick up at office at next visit or via mychart.   Andrez Grime, LPN   [id number]

## 2020-05-10 NOTE — Patient Instructions (Signed)
[name] , Thank you for taking time to come for your Medicare Wellness Visit. I appreciate your ongoing commitment to your health goals. Please review the following plan we discussed and let me know if I can assist you in the future.   Screening recommendations/referrals: Colonoscopy: no longer required  Mammogram: scheduled 06/21/2020 Bone Density: Up to date, completed 08/13/2018, due 08/2020 Recommended yearly ophthalmology/optometry visit for glaucoma screening and checkup Recommended yearly dental visit for hygiene and checkup  Vaccinations: Influenza vaccine: Up to date, completed 11/30/2019, due 09/2020 Pneumococcal vaccine: Completed series Tdap vaccine: decline-insurance  Shingles vaccine: Completed series   Covid-19: 2 doses completed, booster due   Advanced directives: Advance directive discussed with you today. Even though you declined this today please call our office should you change your mind and we can give you the proper paperwork for you to fill out.   Conditions/risks identified: hypertension, hypercholesterolemia   Next appointment: Follow up in one year for your annual wellness visit    Preventive Care 57 Years and Older, Female Preventive care refers to lifestyle choices and visits with your health care provider that can promote health and wellness. What does preventive care include?  A yearly physical exam. This is also called an annual well check.  Dental exams once or twice a year.  Routine eye exams. Ask your health care provider how often you should have your eyes checked.  Personal lifestyle choices, including:  Daily care of your teeth and gums.  Regular physical activity.  Eating a healthy diet.  Avoiding tobacco and drug use.  Limiting alcohol use.  Practicing safe sex.  Taking low-dose aspirin every day.  Taking vitamin and mineral supplements as recommended by your health care provider. What happens during an annual well check? The  services and screenings done by your health care provider during your annual well check will depend on your age, overall health, lifestyle risk factors, and family history of disease. Counseling  Your health care provider may ask you questions about your:  Alcohol use.  Tobacco use.  Drug use.  Emotional well-being.  Home and relationship well-being.  Sexual activity.  Eating habits.  History of falls.  Memory and ability to understand (cognition).  Work and work Statistician.  Reproductive health. Screening  You may have the following tests or measurements:  Height, weight, and BMI.  Blood pressure.  Lipid and cholesterol Bowers. These may be checked every 5 years, or more frequently if you are over 73 years old.  Skin check.  Lung cancer screening. You may have this screening every year starting at age 45 if you have a 30-pack-year history of smoking and currently smoke or have quit within the past 15 years.  Fecal occult blood test (FOBT) of the stool. You may have this test every year starting at age 65.  Flexible sigmoidoscopy or colonoscopy. You may have a sigmoidoscopy every 5 years or a colonoscopy every 10 years starting at age 76.  Hepatitis C blood test.  Hepatitis B blood test.  Sexually transmitted disease (STD) testing.  Diabetes screening. This is done by checking your blood sugar (glucose) after you have not eaten for a while (fasting). You may have this done every 1-3 years.  Bone density scan. This is done to screen for osteoporosis. You may have this done starting at age 73.  Mammogram. This may be done every 1-2 years. Talk to your health care provider about how often you should have regular mammograms. Talk with your  health care provider about your test results, treatment options, and if necessary, the need for more tests. Vaccines  Your health care provider may recommend certain vaccines, such as:  Influenza vaccine. This is recommended  every year.  Tetanus, diphtheria, and acellular pertussis (Tdap, Td) vaccine. You may need a Td booster every 10 years.  Zoster vaccine. You may need this after age 40.  Pneumococcal 13-valent conjugate (PCV13) vaccine. One dose is recommended after age 61.  Pneumococcal polysaccharide (PPSV23) vaccine. One dose is recommended after age 14. Talk to your health care provider about which screenings and vaccines you need and how often you need them. This information is not intended to replace advice given to you by your health care provider. Make sure you discuss any questions you have with your health care provider. Document Released: 02/17/2015 Document Revised: 10/11/2015 Document Reviewed: 11/22/2014 Elsevier Interactive Patient Education  2017 [employer] Prevention in the Home Falls can cause injuries. They can happen to people of all ages. There are many things you can do to make your home safe and to help prevent falls. What can I do on the outside of my home?  Regularly fix the edges of walkways and driveways and fix any cracks.  Remove anything that might make you trip as you walk through a door, such as a raised step or threshold.  Trim any bushes or trees on the path to your home.  Use bright outdoor lighting.  Clear any walking paths of anything that might make someone trip, such as rocks or tools.  Regularly check to see if handrails are loose or broken. Make sure that both sides of any steps have handrails.  Any raised decks and porches should have guardrails on the edges.  Have any leaves, snow, or ice cleared regularly.  Use sand or salt on walking paths during winter.  Clean up any spills in your garage right away. This includes oil or grease spills. What can I do in the bathroom?  Use night lights.  Install grab bars by the toilet and in the tub and shower. Do not use towel bars as grab bars.  Use non-skid mats or decals in the tub or shower.  If  you need to sit down in the shower, use a plastic, non-slip stool.  Keep the floor dry. Clean up any water that spills on the floor as soon as it happens.  Remove soap buildup in the tub or shower regularly.  Attach bath mats securely with double-sided non-slip rug tape.  Do not have throw rugs and other things on the floor that can make you trip. What can I do in the bedroom?  Use night lights.  Make sure that you have a light by your bed that is easy to reach.  Do not use any sheets or blankets that are too big for your bed. They should not hang down onto the floor.  Have a firm chair that has side arms. You can use this for support while you get dressed.  Do not have throw rugs and other things on the floor that can make you trip. What can I do in the kitchen?  Clean up any spills right away.  Avoid walking on wet floors.  Keep items that you use a lot in easy-to-reach places.  If you need to reach something above you, use a strong step stool that has a grab bar.  Keep electrical cords out of the way.  Do not use  floor polish or wax that makes floors slippery. If you must use wax, use non-skid floor wax.  Do not have throw rugs and other things on the floor that can make you trip. What can I do with my stairs?  Do not leave any items on the stairs.  Make sure that there are handrails on both sides of the stairs and use them. Fix handrails that are broken or loose. Make sure that handrails are as long as the stairways.  Check any carpeting to make sure that it is firmly attached to the stairs. Fix any carpet that is loose or worn.  Avoid having throw rugs at the top or bottom of the stairs. If you do have throw rugs, attach them to the floor with carpet tape.  Make sure that you have a light switch at the top of the stairs and the bottom of the stairs. If you do not have them, ask someone to add them for you. What else can I do to help prevent falls?  Wear shoes  that:  Do not have high heels.  Have rubber bottoms.  Are comfortable and fit you well.  Are closed at the toe. Do not wear sandals.  If you use a stepladder:  Make sure that it is fully opened. Do not climb a closed stepladder.  Make sure that both sides of the stepladder are locked into place.  Ask someone to hold it for you, if possible.  Clearly mark and make sure that you can see:  Any grab bars or handrails.  First and last steps.  Where the edge of each step is.  Use tools that help you move around (mobility aids) if they are needed. These include:  Canes.  Walkers.  Scooters.  Crutches.  Turn on the lights when you go into a dark area. Replace any light bulbs as soon as they burn out.  Set up your furniture so you have a clear path. Avoid moving your furniture around.  If any of your floors are uneven, fix them.  If there are any pets around you, be aware of where they are.  Review your medicines with your doctor. Some medicines can make you feel dizzy. This can increase your chance of falling. Ask your doctor what other things that you can do to help prevent falls. This information is not intended to replace advice given to you by your health care provider. Make sure you discuss any questions you have with your health care provider. Document Released: 11/17/2008 Document Revised: 06/29/2015 Document Reviewed: 02/25/2014 Elsevier Interactive Patient Education  2017 [employer].

## 2020-05-10 NOTE — Progress Notes (Signed)
No critical labs need to be addressed urgently. We will discuss labs in detail at upcoming office visit.   

## 2020-05-23 ENCOUNTER — Encounter: Payer: Self-pay | Admitting: Family Medicine

## 2020-05-23 ENCOUNTER — Other Ambulatory Visit: Payer: Self-pay

## 2020-05-23 ENCOUNTER — Ambulatory Visit (INDEPENDENT_AMBULATORY_CARE_PROVIDER_SITE_OTHER): Payer: Medicare Other | Admitting: Family Medicine

## 2020-05-23 VITALS — BP 120/80 | HR 74 | Temp 97.5°F | Ht 60.5 in | Wt 129.5 lb

## 2020-05-23 DIAGNOSIS — K219 Gastro-esophageal reflux disease without esophagitis: Secondary | ICD-10-CM

## 2020-05-23 DIAGNOSIS — Z0000 Encounter for general adult medical examination without abnormal findings: Secondary | ICD-10-CM | POA: Diagnosis not present

## 2020-05-23 DIAGNOSIS — E7800 Pure hypercholesterolemia, unspecified: Secondary | ICD-10-CM | POA: Diagnosis not present

## 2020-05-23 DIAGNOSIS — I10 Essential (primary) hypertension: Secondary | ICD-10-CM | POA: Diagnosis not present

## 2020-05-23 MED ORDER — FAMOTIDINE 20 MG PO TABS: 20.0000 mg | ORAL_TABLET | Freq: Two times a day (BID) | ORAL | 11 refills | Status: DC

## 2020-05-23 MED ORDER — FLUTICASONE PROPIONATE 50 MCG/ACT NA SUSP: 2.0000 | Freq: Every day | NASAL | 11 refills | Status: AC

## 2020-05-23 NOTE — Patient Instructions (Addendum)
Start pepcid AC twice daily. Start back flonase 2 sprays per nostril.  Call if hoarseness and reflux not improving.  Get back on track with low cholesterol diet.  Can try topical Voltaren on hip.

## 2020-05-23 NOTE — Progress Notes (Signed)
Patient ID: [name], female    DOB: 1940-09-06, 80 y.o.   MRN: [id number]  This visit was conducted in person.  BP 120/80   Pulse 74   Temp (!) 97.5 F (36.4 C) (Temporal)   Ht 5' 0.5" (1.537 m)   Wt 129 lb 8 oz (58.7 kg)   SpO2 96%   BMI 24.87 kg/m    CC:  Chief Complaint  Patient presents with  . Annual Exam    Part 2    Subjective:   HPI: [name] is a 80 y.o. female presenting on 05/23/2020 for Annual Exam (Part 2)  The patient presents forcomplete physical and review of chronic health problems. He/She also has the following acute concerns today: allergies, post nasal drip.  Has hoarse vice... worse later in the day. Flonase helps some.  Has acid reflux daily... does not want to take  The patient saw a LPN or RN for medicare wellness visit.  Prevention and wellness was reviewed in detail. Note reviewed and important notes copied below.  Health Maintenance: Covid booster- due Abnormal Screenings: None  05/23/20    Elevated Cholesterol:  Inadequate control on red yeast rice. Pt remains not interested in prescription statin. Lab Results  Component Value Date   CHOL 233 (H) 05/10/2020   HDL 56.60 05/10/2020   LDLCALC 158 (H) 05/10/2020   LDLDIRECT 148.1 11/17/2012   TRIG 91.0 05/10/2020   CHOLHDL 4 05/10/2020  Using medications without problems: Muscle aches:  Diet compliance: not eating as well as she should. Exercise: walking daily Other complaints:  Hypertension:   Good control on losartan HCTZ   BP Readings from Last 3 Encounters:  05/23/20 120/80  10/14/19 (!) 150/80  04/22/19 124/80  Using medication without problems or lightheadedness:  none Chest pain with exertion:none Edema:none Short of breath: none Average home BPs: Other issues:  Hx of endometrial cancer Dx 2017: Stage 1A Grade 2 endometrial cancer S/p robotic assisted total hysterectomy, BSO, sentinel lymph node mappingon 06/13/15. noLVSI, 40% myometrial invasion,  negativelymph nodes. Followed in past  by Dr. Denman George. Surgeon Dr. Skeet Latch. Released.  Relevant past medical, surgical, family and social history reviewed and updated as indicated. Interim medical history since our last visit reviewed. Allergies and medications reviewed and updated. Outpatient Medications Prior to Visit  Medication Sig Dispense Refill  . Cholecalciferol 1000 UNITS capsule Take 1,000 Units by mouth 2 (two) times daily.    . Cyanocobalamin (B-12 PO) Take 1 tablet by mouth daily.     . Flaxseed, Linseed, (FLAX SEED OIL) 1000 MG CAPS Take 1 capsule by mouth daily.    . fluticasone (FLONASE) 50 MCG/ACT nasal spray Place 2 sprays into both nostrils daily. 16 g 6  . losartan-hydrochlorothiazide (HYZAAR) 50-12.5 MG tablet TAKE 1 TABLET BY MOUTH DAILY 90 tablet 0  . Red Yeast Rice Extract (RED YEAST RICE PO) Take 1,200 mg by mouth every morning.     . sodium fluoride-calcium carbonate (FLORICAL) 8.3-364 MG CAPS Take 1 capsule by mouth 2 (two) times daily.    . TURMERIC PO Take by mouth.     No facility-administered medications prior to visit.     Per HPI unless specifically indicated in ROS section below Review of Systems  Constitutional: Negative for fatigue and fever.  HENT: Negative for congestion.   Eyes: Negative for pain.  Respiratory: Negative for cough and shortness of breath.   Cardiovascular: Negative for chest pain, palpitations and leg swelling.  Gastrointestinal: Negative  for abdominal pain.  Genitourinary: Negative for dysuria and vaginal bleeding.  Musculoskeletal: Negative for back pain.  Neurological: Negative for syncope, light-headedness and headaches.  Psychiatric/Behavioral: Negative for dysphoric mood.   Objective:  BP 120/80   Pulse 74   Temp (!) 97.5 F (36.4 C) (Temporal)   Ht 5' 0.5" (1.537 m)   Wt 129 lb 8 oz (58.7 kg)   SpO2 96%   BMI 24.87 kg/m   Wt Readings from Last 3 Encounters:  05/23/20 129 lb 8 oz (58.7 kg)  10/14/19 128 lb 8 oz  (58.3 kg)  04/22/19 129 lb 12 oz (58.9 kg)      Physical Exam Constitutional:      General: She is not in acute distress.    Appearance: Normal appearance. She is well-developed. She is not ill-appearing or toxic-appearing.  HENT:     Head: Normocephalic.     Right Ear: Hearing, tympanic membrane, ear canal and external ear normal.     Left Ear: Hearing, tympanic membrane, ear canal and external ear normal.     Nose: Nose normal.  Eyes:     General: Lids are normal. Lids are everted, no foreign bodies appreciated.     Conjunctiva/sclera: Conjunctivae normal.     Pupils: Pupils are equal, round, and reactive to light.  Neck:     Thyroid: No thyroid mass or thyromegaly.     Vascular: No carotid bruit.     Trachea: Trachea normal.  Cardiovascular:     Rate and Rhythm: Normal rate and regular rhythm.     Heart sounds: Normal heart sounds, S1 normal and S2 normal. No murmur heard. No gallop.   Pulmonary:     Effort: Pulmonary effort is normal. No respiratory distress.     Breath sounds: Normal breath sounds. No wheezing, rhonchi or rales.  Abdominal:     General: Bowel sounds are normal. There is no distension or abdominal bruit.     Palpations: Abdomen is soft. There is no fluid wave or mass.     Tenderness: There is no abdominal tenderness. There is no guarding or rebound.     Hernia: No hernia is present.  Musculoskeletal:     Cervical back: Normal range of motion and neck supple.  Lymphadenopathy:     Cervical: No cervical adenopathy.  Skin:    General: Skin is warm and dry.     Findings: No rash.  Neurological:     Mental Status: She is alert.     Cranial Nerves: No cranial nerve deficit.     Sensory: No sensory deficit.  Psychiatric:        Mood and Affect: Mood is not anxious or depressed.        Speech: Speech normal.        Behavior: Behavior normal. Behavior is cooperative.        Judgment: Judgment normal.       Results for orders placed or performed in visit  on 05/10/20  Comprehensive metabolic panel  Result Value Ref Range   Sodium 139 135 - 145 mEq/L   Potassium 3.8 3.5 - 5.1 mEq/L   Chloride 102 96 - 112 mEq/L   CO2 28 19 - 32 mEq/L   Glucose, Bld 84 70 - 99 mg/dL   BUN 21 6 - 23 mg/dL   Creatinine, Ser 0.90 0.40 - 1.20 mg/dL   Total Bilirubin 0.6 0.2 - 1.2 mg/dL   Alkaline Phosphatase 49 39 - 117 U/L   AST  18 0 - 37 U/L   ALT 14 0 - 35 U/L   Total Protein 7.0 6.0 - 8.3 g/dL   Albumin 4.3 3.5 - 5.2 g/dL   GFR 59.72 (L) >60.00 mL/min   Calcium 9.7 8.4 - 10.5 mg/dL  Lipid panel  Result Value Ref Range   Cholesterol 233 (H) 0 - 200 mg/dL   Triglycerides 91.0 0.0 - 149.0 mg/dL   HDL 56.60 >39.00 mg/dL   VLDL 18.2 0.0 - 40.0 mg/dL   LDL Cholesterol 158 (H) 0 - 99 mg/dL   Total CHOL/HDL Ratio 4    NonHDL 176.29     This visit occurred during the SARS-CoV-2 public health emergency.  Safety protocols were in place, including screening questions prior to the visit, additional usage of staff PPE, and extensive cleaning of exam room while observing appropriate contact time as indicated for disinfecting solutions.   COVID 19 screen:  No recent travel or known exposure to COVID19 The patient denies respiratory symptoms of COVID 19 at this time. The importance of social distancing was discussed today.   Assessment and Plan The patient's preventative maintenance and recommended screening tests for an annual wellness exam were reviewed in full today. Brought up to date unless services declined.  Counselled on the importance of diet, exercise, and its role in overall health and mortality. The patient's FH and SH was reviewed, including their home life, tobacco status, and drug and alcohol status.   TAH Hx of endometrial cancer Vaccines:uptodateexcept due for td. Received COVID19 vaccine 11/02/2019 , 04/02/2019 , 03/08/2019 Colonoscopy: 06/2005,not indicated due to age Mammo:12/2020hx of breast cancer, schedule 06/2020 DEXA:   08/2018:stable osteopenia, plan repeat in 2025 Non smoker.  Problem List Items Addressed This Visit    Essential hypertension, benign    Stable, chronic.  Continue current medication.   Good control on losartan HCTZ        Gastroesophageal reflux disease    Start pepcid AC twice daily. Start back flonase 2 sprays per nostril.  Call if hoarseness and reflux not improving.       Relevant Medications   famotidine (PEPCID) 20 MG tablet   HYPERCHOLESTEROLEMIA    Inadequate control on red yeast rice. Pt remains not interested in prescription statin.        Other Visit Diagnoses    Routine general medical examination at a health care facility    -  Primary       Eliezer Lofts, MD

## 2020-06-21 ENCOUNTER — Other Ambulatory Visit: Payer: Self-pay

## 2020-06-21 ENCOUNTER — Ambulatory Visit
Admission: RE | Admit: 2020-06-21 | Discharge: 2020-06-21 | Disposition: A | Discharge status: Home / Self Care | Payer: Medicare Other | Source: Ambulatory Visit | Attending: Family Medicine | Admitting: Family Medicine

## 2020-06-21 DIAGNOSIS — Z1231 Encounter for screening mammogram for malignant neoplasm of breast: Secondary | ICD-10-CM

## 2020-07-01 DIAGNOSIS — K219 Gastro-esophageal reflux disease without esophagitis: Secondary | ICD-10-CM | POA: Insufficient documentation

## 2020-07-01 NOTE — Assessment & Plan Note (Signed)
Stable, chronic.  Continue current medication.   Good control on losartan HCTZ

## 2020-07-01 NOTE — Assessment & Plan Note (Signed)
Start pepcid AC twice daily. Start back flonase 2 sprays per nostril.  Call if hoarseness and reflux not improving.

## 2020-07-01 NOTE — Assessment & Plan Note (Signed)
Inadequate control on red yeast rice. Pt remains not interested in prescription statin.

## 2020-07-11 ENCOUNTER — Other Ambulatory Visit: Payer: Self-pay | Admitting: Family Medicine

## 2020-10-12 ENCOUNTER — Ambulatory Visit (INDEPENDENT_AMBULATORY_CARE_PROVIDER_SITE_OTHER): Payer: Medicare Other | Admitting: Family Medicine

## 2020-10-12 ENCOUNTER — Other Ambulatory Visit: Payer: Self-pay

## 2020-10-12 VITALS — BP 142/80 | HR 82 | Temp 98.0°F | Wt 130.2 lb

## 2020-10-12 DIAGNOSIS — Z23 Encounter for immunization: Secondary | ICD-10-CM | POA: Diagnosis not present

## 2020-10-12 DIAGNOSIS — M79671 Pain in right foot: Secondary | ICD-10-CM

## 2020-10-12 NOTE — Progress Notes (Signed)
Patient ID: [name], female    DOB: [birth year], 80 y.o.   MRN: [id number]  This visit was conducted in person.  Temp 98 F (36.7 C) (Temporal)   Wt 130 lb 4 oz (59.1 kg)   BMI 25.02 kg/m    CC:  Chief Complaint  Patient presents with   Foot Pain    X 4 days      Subjective:   HPI: [name] is a 80 y.o. 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 Mild  No fall, no injury, no change in activity.  No cahnge in shoes No redness, no heat.    She took her husband's colchicine... 1 the first day, then 2 on days after.    Relevant past medical, surgical, family and social history reviewed and updated as indicated. Interim medical history since our last visit reviewed. Allergies and medications reviewed and updated. Outpatient Medications Prior to Visit  Medication Sig Dispense Refill   Cholecalciferol 1000 UNITS capsule Take 1,000 Units by mouth 2 (two) times daily.     Cyanocobalamin (B-12 PO) Take 1 tablet by mouth daily.      Flaxseed, Linseed, (FLAX SEED OIL) 1000 MG CAPS Take 1 capsule by mouth daily.     fluticasone (FLONASE) 50 MCG/ACT nasal spray Place 2 sprays into both nostrils daily. 16 g 11   losartan-hydrochlorothiazide (HYZAAR) 50-12.5 MG tablet TAKE 1 TABLET BY MOUTH DAILY 90 tablet 3   sodium fluoride-calcium carbonate (FLORICAL) 8.3-364 MG CAPS Take 1 capsule by mouth 2 (two) times daily.     famotidine (PEPCID) 20 MG tablet Take 1 tablet (20 mg total) by mouth 2 (two) times daily. 60 tablet 11   Red Yeast Rice Extract (RED YEAST RICE PO) Take 1,200 mg by mouth every morning.      TURMERIC PO Take by mouth.     No facility-administered medications prior to visit.     Per HPI unless specifically indicated in ROS section below Review of Systems  Constitutional:  Negative for fatigue and fever.  HENT:  Negative for  congestion.   Eyes:  Negative for pain.  Respiratory:  Negative for cough and shortness of breath.   Cardiovascular:  Negative for chest pain, palpitations and leg swelling.  Gastrointestinal:  Negative for abdominal pain.  Genitourinary:  Negative for dysuria and vaginal bleeding.  Musculoskeletal:  Negative for back pain.  Neurological:  Negative for syncope, light-headedness and headaches.  Psychiatric/Behavioral:  Negative for dysphoric mood.   Objective:  Temp 98 F (36.7 C) (Temporal)   Wt 130 lb 4 oz (59.1 kg)   BMI 25.02 kg/m   Wt Readings from Last 3 Encounters:  10/12/20 130 lb 4 oz (59.1 kg)  05/23/20 129 lb 8 oz (58.7 kg)  10/14/19 128 lb 8 oz (58.3 kg)      Physical Exam Constitutional:      General: She is not in acute distress.    Appearance: Normal appearance. She is well-developed. She is not ill-appearing or toxic-appearing.  HENT:     Head: Normocephalic.     Right Ear: Hearing, tympanic membrane, ear canal and external ear normal. Tympanic membrane is not erythematous, retracted or bulging.     Left Ear: Hearing, tympanic membrane, ear canal and external ear normal. Tympanic membrane is not erythematous,  retracted or bulging.     Nose: No mucosal edema or rhinorrhea.     Right Sinus: No maxillary sinus tenderness or frontal sinus tenderness.     Left Sinus: No maxillary sinus tenderness or frontal sinus tenderness.     Mouth/Throat:     Pharynx: Uvula midline.  Eyes:     General: Lids are normal. Lids are everted, no foreign bodies appreciated.     Conjunctiva/sclera: Conjunctivae normal.     Pupils: Pupils are equal, round, and reactive to light.  Neck:     Thyroid: No thyroid mass or thyromegaly.     Vascular: No carotid bruit.     Trachea: Trachea normal.  Cardiovascular:     Rate and Rhythm: Normal rate and regular rhythm.     Pulses: Normal pulses.          Dorsalis pedis pulses are 2+ on the right side and 2+ on the left side.     Heart sounds:  Normal heart sounds, S1 normal and S2 normal. No murmur heard.   No friction rub. No gallop.  Pulmonary:     Effort: Pulmonary effort is normal. No tachypnea or respiratory distress.     Breath sounds: Normal breath sounds. No decreased breath sounds, wheezing, rhonchi or rales.  Abdominal:     General: Bowel sounds are normal.     Palpations: Abdomen is soft.     Tenderness: There is no abdominal tenderness.  Musculoskeletal:     Cervical back: Normal range of motion and neck supple.  Feet:     Right foot:     Skin integrity: Skin integrity normal.     Toenail Condition: Right toenails are normal.     Left foot:     Skin integrity: Skin integrity normal.     Toenail Condition: Left toenails are normal.     Comments: TTP in dorsal right foot, no redness, no swelling Skin:    General: Skin is warm and dry.     Findings: No rash.  Neurological:     Mental Status: She is alert.  Psychiatric:        Mood and Affect: Mood is not anxious or depressed.        Speech: Speech normal.        Behavior: Behavior normal. Behavior is cooperative.        Thought Content: Thought content normal.        Judgment: Judgment normal.      Results for orders placed or performed in visit on 05/10/20  Comprehensive metabolic panel  Result Value Ref Range   Sodium 139 135 - 145 mEq/L   Potassium 3.8 3.5 - 5.1 mEq/L   Chloride 102 96 - 112 mEq/L   CO2 28 19 - 32 mEq/L   Glucose, Bld 84 70 - 99 mg/dL   BUN 21 6 - 23 mg/dL   Creatinine, Ser 0.90 0.40 - 1.20 mg/dL   Total Bilirubin 0.6 0.2 - 1.2 mg/dL   Alkaline Phosphatase 49 39 - 117 U/L   AST 18 0 - 37 U/L   ALT 14 0 - 35 U/L   Total Protein 7.0 6.0 - 8.3 g/dL   Albumin 4.3 3.5 - 5.2 g/dL   GFR 59.72 (L) >60.00 mL/min   Calcium 9.7 8.4 - 10.5 mg/dL  Lipid panel  Result Value Ref Range   Cholesterol 233 (H) 0 - 200 mg/dL   Triglycerides 91.0 0.0 - 149.0 mg/dL   HDL 56.60 >39.00 mg/dL  VLDL 18.2 0.0 - 40.0 mg/dL   LDL Cholesterol 158 (H)  0 - 99 mg/dL   Total CHOL/HDL Ratio 4    NonHDL 176.29     This visit occurred during the SARS-CoV-2 public health emergency.  Safety protocols were in place, including screening questions prior to the visit, additional usage of staff PPE, and extensive cleaning of exam room while observing appropriate contact time as indicated for disinfecting solutions.   COVID 19 screen:  No recent travel or known exposure to COVID19 The patient denies respiratory symptoms of COVID 19 at this time. The importance of social distancing was discussed today.   Assessment and Plan Problem List Items Addressed This Visit     Right foot pain - Primary    Acute Start topical diclofenac gel 4 time daily.  Ice, elevate and start home stretching.  Follow up in not improving in 2 weeks.      Other Visit Diagnoses     Need for influenza vaccination       Relevant Orders   Flu Vaccine QUAD High Dose(Fluad) (Completed)          Eliezer Lofts, MD

## 2020-10-12 NOTE — Patient Instructions (Signed)
Start topical diclofenac gel 4 time daily.  Ice, elevate and start home stretching.  Follow up in not improving in 2 weeks.

## 2020-11-08 NOTE — Assessment & Plan Note (Signed)
Acute Start topical diclofenac gel 4 time daily.  Ice, elevate and start home stretching.  Follow up in not improving in 2 weeks.

## 2021-02-08 ENCOUNTER — Encounter: Payer: Self-pay | Admitting: Family Medicine

## 2021-02-08 ENCOUNTER — Other Ambulatory Visit: Payer: Self-pay

## 2021-02-08 ENCOUNTER — Ambulatory Visit (INDEPENDENT_AMBULATORY_CARE_PROVIDER_SITE_OTHER): Payer: Medicare Other | Admitting: Family Medicine

## 2021-02-08 VITALS — BP 150/90 | HR 92 | Temp 97.3°F | Ht 60.5 in | Wt 128.1 lb

## 2021-02-08 DIAGNOSIS — S76212A Strain of adductor muscle, fascia and tendon of left thigh, initial encounter: Secondary | ICD-10-CM | POA: Diagnosis not present

## 2021-02-08 DIAGNOSIS — M25511 Pain in right shoulder: Secondary | ICD-10-CM | POA: Diagnosis not present

## 2021-02-08 DIAGNOSIS — M542 Cervicalgia: Secondary | ICD-10-CM | POA: Diagnosis not present

## 2021-02-08 MED ORDER — MELOXICAM 7.5 MG PO TABS: 7.5000 mg | ORAL_TABLET | Freq: Every day | ORAL | 0 refills | Status: DC

## 2021-02-08 NOTE — Patient Instructions (Signed)
Hold aleve.  Start meloxicam daily for 2 weeks for pain and inflammation.  Start home exercises.  Call if pain is not improved in 2-3 days for [place] to higher dose.  If still not improving after 2 week, follow up for possible X-rays.

## 2021-02-08 NOTE — Assessment & Plan Note (Signed)
As well as possible mild trochanteric bursitis.  Treat with meloxicam low dose, start home PT, Ice.. follow up in 2 weeks if not improving.

## 2021-02-08 NOTE — Assessment & Plan Note (Signed)
Minimal pain, possible OA .Marland Kitchen no longer bothering her.  No S/S of radiculopathy.

## 2021-02-08 NOTE — Assessment & Plan Note (Signed)
Most likely improving bursitis vs tendonitits  Treat with meloxicam low dose, start home PT, ICe.. follow up in 2 weeks if not improving.  Consider X-ray at that time if not improving.

## 2021-02-08 NOTE — Progress Notes (Signed)
Patient ID: [name], female    DOB: [birth year], 81 y.o.   MRN: [id number]  This visit was conducted in person.  BP (!) 150/90    Pulse 92    Temp (!) 97.3 F (36.3 C) (Temporal)    Ht 5' 0.5" (1.537 m)    Wt 128 lb 2 oz (58.1 kg)    SpO2 97%    BMI 24.61 kg/m    CC: Chief Complaint  Patient presents with   Shoulder Pain    Right-started 2 weeks ago-   Groin Pain    Right-Radiates down leg   Neck Pain    Subjective:   HPI: [name] is a 81 y.o. female presenting on 02/08/2021 for Shoulder Pain (Right-started 2 weeks ago-), Groin Pain (Right-Radiates down leg), and Neck Pain  Right shoulder pain x 2 weeks.. started  in right upper arm and now in both anterior and posterior arm.Pain with abduction. She has been doing some home ROM exercise.  Stiff first thing in AM.  Topical voltaren does not help much.  2. left groin pain, anterior.. pain radiated to left upper thigh. Pain mainly with abduction laterally of leg. No low back pain. Ongoing x 2 weeks.   3. Occ pain in left  neck, intermittent , mild No radiation of pain, no numbness or tingling.  No weakness.   She is very active during the day.   She has started taking aleve at night.. given she feels very achy at night.  This helps her rest somewhat better but does not make the pain resolve  Has use  No recent falls.       Relevant past medical, surgical, family and social history reviewed and updated as indicated. Interim medical history since our last visit reviewed. Allergies and medications reviewed and updated. Outpatient Medications Prior to Visit  Medication Sig Dispense Refill   Cholecalciferol 1000 UNITS capsule Take 1,000 Units by mouth 2 (two) times daily.     Cyanocobalamin (B-12 PO) Take 1 tablet by mouth daily.      Flaxseed, Linseed, (FLAX SEED OIL) 1000 MG CAPS Take 1 capsule by mouth daily.     fluticasone (FLONASE) 50 MCG/ACT nasal spray Place 2 sprays into both nostrils daily. 16 g 11    losartan-hydrochlorothiazide (HYZAAR) 50-12.5 MG tablet TAKE 1 TABLET BY MOUTH DAILY 90 tablet 3   sodium fluoride-calcium carbonate (FLORICAL) 8.3-364 MG CAPS Take 1 capsule by mouth 2 (two) times daily.     No facility-administered medications prior to visit.     Per HPI unless specifically indicated in ROS section below Review of Systems  Constitutional:  Negative for fatigue and fever.  HENT:  Negative for congestion.   Eyes:  Negative for pain.  Respiratory:  Negative for cough and shortness of breath.   Cardiovascular:  Negative for chest pain, palpitations and leg swelling.  Gastrointestinal:  Negative for abdominal pain.  Genitourinary:  Negative for dysuria and vaginal bleeding.  Musculoskeletal:  Negative for back pain.  Neurological:  Negative for syncope, light-headedness and headaches.  Psychiatric/Behavioral:  Negative for dysphoric mood.   Objective:  BP (!) 150/90    Pulse 92    Temp (!) 97.3 F (36.3 C) (Temporal)    Ht 5' 0.5" (1.537 m)    Wt 128 lb 2 oz (58.1 kg)    SpO2 97%    BMI 24.61 kg/m   Wt Readings from Last 3 Encounters:  02/08/21 128 lb 2 oz (  58.1 kg)  10/12/20 130 lb 4 oz (59.1 kg)  05/23/20 129 lb 8 oz (58.7 kg)      Physical Exam Constitutional:      General: She is not in acute distress.    Appearance: Normal appearance. She is well-developed. She is not ill-appearing or toxic-appearing.  HENT:     Head: Normocephalic.     Right Ear: Hearing, tympanic membrane, ear canal and external ear normal. Tympanic membrane is not erythematous, retracted or bulging.     Left Ear: Hearing, tympanic membrane, ear canal and external ear normal. Tympanic membrane is not erythematous, retracted or bulging.     Nose: No mucosal edema or rhinorrhea.     Right Sinus: No maxillary sinus tenderness or frontal sinus tenderness.     Left Sinus: No maxillary sinus tenderness or frontal sinus tenderness.     Mouth/Throat:     Pharynx: Uvula midline.  Eyes:      General: Lids are normal. Lids are everted, no foreign bodies appreciated.     Conjunctiva/sclera: Conjunctivae normal.     Pupils: Pupils are equal, round, and reactive to light.  Neck:     Thyroid: No thyroid mass or thyromegaly.     Vascular: No carotid bruit.     Trachea: Trachea normal.  Cardiovascular:     Rate and Rhythm: Normal rate and regular rhythm.     Pulses: Normal pulses.     Heart sounds: Normal heart sounds, S1 normal and S2 normal. No murmur heard.   No friction rub. No gallop.  Pulmonary:     Effort: Pulmonary effort is normal. No tachypnea or respiratory distress.     Breath sounds: Normal breath sounds. No decreased breath sounds, wheezing, rhonchi or rales.  Abdominal:     General: Bowel sounds are normal.     Palpations: Abdomen is soft.     Tenderness: There is no abdominal tenderness.  Musculoskeletal:     Right shoulder: Tenderness present. No bony tenderness. Normal range of motion. Normal strength.     Right upper arm: No swelling, tenderness or bony tenderness.     Cervical back: Normal range of motion and neck supple. No tenderness or bony tenderness. No pain with movement.     Lumbar back: No tenderness or bony tenderness. Normal range of motion. Negative right straight leg raise test and negative left straight leg raise test.     Right hip: Normal.     Left hip: Tenderness present. No bony tenderness. Normal range of motion. Normal strength.     Comments: Ttp over groin muscles, mild soreness over lateral trochanteric bursa  Skin:    General: Skin is warm and dry.     Findings: No rash.  Neurological:     Mental Status: She is alert.  Psychiatric:        Mood and Affect: Mood is not anxious or depressed.        Speech: Speech normal.        Behavior: Behavior normal. Behavior is cooperative.        Thought Content: Thought content normal.        Judgment: Judgment normal.      Results for orders placed or performed in visit on 05/10/20   Comprehensive metabolic panel  Result Value Ref Range   Sodium 139 135 - 145 mEq/L   Potassium 3.8 3.5 - 5.1 mEq/L   Chloride 102 96 - 112 mEq/L   CO2 28 19 - 32 mEq/L  Glucose, Bld 84 70 - 99 mg/dL   BUN 21 6 - 23 mg/dL   Creatinine, Ser 0.90 0.40 - 1.20 mg/dL   Total Bilirubin 0.6 0.2 - 1.2 mg/dL   Alkaline Phosphatase 49 39 - 117 U/L   AST 18 0 - 37 U/L   ALT 14 0 - 35 U/L   Total Protein 7.0 6.0 - 8.3 g/dL   Albumin 4.3 3.5 - 5.2 g/dL   GFR 59.72 (L) >60.00 mL/min   Calcium 9.7 8.4 - 10.5 mg/dL  Lipid panel  Result Value Ref Range   Cholesterol 233 (H) 0 - 200 mg/dL   Triglycerides 91.0 0.0 - 149.0 mg/dL   HDL 56.60 >39.00 mg/dL   VLDL 18.2 0.0 - 40.0 mg/dL   LDL Cholesterol 158 (H) 0 - 99 mg/dL   Total CHOL/HDL Ratio 4    NonHDL 176.29     This visit occurred during the SARS-CoV-2 public health emergency.  Safety protocols were in place, including screening questions prior to the visit, additional usage of staff PPE, and extensive cleaning of exam room while observing appropriate contact time as indicated for disinfecting solutions.   COVID 19 screen:  No recent travel or known exposure to COVID19 The patient denies respiratory symptoms of COVID 19 at this time. The importance of social distancing was discussed today.   Assessment and Plan Problem List Items Addressed This Visit     Acute pain of right shoulder    Most likely improving bursitis vs tendonitits  Treat with meloxicam low dose, start home PT, ICe.. follow up in 2 weeks if not improving.  Consider X-ray at that time if not improving.      Neck pain    Minimal pain, possible OA .Marland Kitchen no longer bothering her.  No S/S of radiculopathy.      Strain of left groin - Primary    As well as possible mild trochanteric bursitis.  Treat with meloxicam low dose, start home PT, Ice.. follow up in 2 weeks if not improving.             Eliezer Lofts, MD

## 2021-02-15 ENCOUNTER — Telehealth: Payer: Self-pay | Admitting: Family Medicine

## 2021-02-15 NOTE — Telephone Encounter (Signed)
Pt called the meloxicam (MOBIC) 7.5 MG tablet is not helping she wanted to know if this could be increased

## 2021-02-16 NOTE — Addendum Note (Signed)
Addended by: Carter Kitten on: 02/16/2021 08:59 AM   Modules accepted: Orders

## 2021-02-16 NOTE — Telephone Encounter (Signed)
Have her try 2 tabs of 7.5 mg daily... call if this has not helped after a few days.

## 2021-02-16 NOTE — Telephone Encounter (Signed)
[name] notified as instructed by telephone.  Patient states understanding.  Medication list updated.

## 2021-04-05 ENCOUNTER — Ambulatory Visit (INDEPENDENT_AMBULATORY_CARE_PROVIDER_SITE_OTHER): Payer: Medicare Other | Admitting: Family Medicine

## 2021-04-05 ENCOUNTER — Other Ambulatory Visit: Payer: Self-pay

## 2021-04-05 ENCOUNTER — Ambulatory Visit (INDEPENDENT_AMBULATORY_CARE_PROVIDER_SITE_OTHER)
Admission: RE | Admit: 2021-04-05 | Discharge: 2021-04-05 | Disposition: A | Discharge status: Home / Self Care | Payer: Medicare Other | Source: Ambulatory Visit | Attending: Family Medicine | Admitting: Family Medicine

## 2021-04-05 VITALS — BP 136/74 | HR 84 | Temp 97.6°F | Resp 16 | Ht 60.0 in | Wt 129.6 lb

## 2021-04-05 DIAGNOSIS — M25552 Pain in left hip: Secondary | ICD-10-CM

## 2021-04-05 MED ORDER — MELOXICAM 15 MG PO TABS: 15.0000 mg | ORAL_TABLET | Freq: Every day | ORAL | 0 refills | Status: DC

## 2021-04-05 NOTE — Patient Instructions (Signed)
We will call with X-ray results. ? Start meloxicam 15 mg daily. ?

## 2021-04-05 NOTE — Assessment & Plan Note (Signed)
Acute on chronic, worsening ? ?Moderate response to 15 mg daily of meloxicam.  Will continue.  Given persistence and worsening of pain will evaluate with x-ray imaging.  She will likely need referral to Ortho for steroid injection.  Referral placed. ?

## 2021-04-05 NOTE — Progress Notes (Signed)
? ? Patient ID: [name], female    DOB: Oct 05, 1940, 81 y.o.   MRN: [id number] ? ?This visit was conducted in person. ? ?BP 136/74   Pulse 84   Temp 97.6 ?F (36.4 ?C)   Resp 16   Ht 5' (1.524 m)   Wt 129 lb 9.6 oz (58.8 kg)   SpO2 98%   BMI 25.31 kg/m?   ? ?CC:  ?Chief Complaint  ?Patient presents with  ? Hip Pain  ? Leg Pain  ? ? ?Subjective:  ? ?HPI: ?[name] is a 81 y.o. female presenting on 04/05/2021 for Hip Pain and Leg Pain ? ?At last OV 02/08/21 felt likely left groin strain  with mild troch bursitis: Treated with meloxicam low dose, home PT and Ice. ?Some improvement in pain with higher dose of meloxicam... ran out and using heat, ice and aleve. Using voltaren gel as well. ? ? She continues to have left anterior hip pain, seems to worse in last few weeks. ?   ?No numbness, no weakness. ? No recent fall. ? ? Stiffness after sitting a while. ?Some trouble with walking. ? ?Relevant past medical, surgical, family and social history reviewed and updated as indicated. Interim medical history since our last visit reviewed. ?Allergies and medications reviewed and updated. ?Outpatient Medications Prior to Visit  ?Medication Sig Dispense Refill  ? Cholecalciferol 1000 UNITS capsule Take 1,000 Units by mouth 2 (two) times daily.    ? Cyanocobalamin (B-12 PO) Take 1 tablet by mouth daily.     ? Flaxseed, Linseed, (FLAX SEED OIL) 1000 MG CAPS Take 1 capsule by mouth daily.    ? fluticasone (FLONASE) 50 MCG/ACT nasal spray Place 2 sprays into both nostrils daily. 16 g 11  ? losartan-hydrochlorothiazide (HYZAAR) 50-12.5 MG tablet TAKE 1 TABLET BY MOUTH DAILY 90 tablet 3  ? meloxicam (MOBIC) 7.5 MG tablet Take 15 mg by mouth daily.    ? sodium fluoride-calcium carbonate (FLORICAL) 8.3-364 MG CAPS Take 1 capsule by mouth 2 (two) times daily.    ? ?No facility-administered medications prior to visit.  ?  ? ?Per HPI unless specifically indicated in ROS section below ?Review of Systems  ?Constitutional:   Negative for fatigue and fever.  ?HENT:  Negative for congestion.   ?Eyes:  Negative for pain.  ?Respiratory:  Negative for cough and shortness of breath.   ?Cardiovascular:  Negative for chest pain, palpitations and leg swelling.  ?Gastrointestinal:  Negative for abdominal pain.  ?Genitourinary:  Negative for dysuria and vaginal bleeding.  ?Musculoskeletal:  Positive for arthralgias. Negative for back pain.  ?Neurological:  Negative for syncope, light-headedness and headaches.  ?Psychiatric/Behavioral:  Negative for dysphoric mood.   ?Objective:  ?BP 136/74   Pulse 84   Temp 97.6 ?F (36.4 ?C)   Resp 16   Ht 5' (1.524 m)   Wt 129 lb 9.6 oz (58.8 kg)   SpO2 98%   BMI 25.31 kg/m?   ?Wt Readings from Last 3 Encounters:  ?04/05/21 129 lb 9.6 oz (58.8 kg)  ?02/08/21 128 lb 2 oz (58.1 kg)  ?10/12/20 130 lb 4 oz (59.1 kg)  ?  ?  ?Physical Exam ?Constitutional:   ?   General: She is not in acute distress. ?   Appearance: Normal appearance. She is well-developed. She is not ill-appearing or toxic-appearing.  ?HENT:  ?   Head: Normocephalic.  ?   Right Ear: Hearing, tympanic membrane, ear canal and external ear normal. Tympanic membrane is  not erythematous, retracted or bulging.  ?   Left Ear: Hearing, tympanic membrane, ear canal and external ear normal. Tympanic membrane is not erythematous, retracted or bulging.  ?   Nose: No mucosal edema or rhinorrhea.  ?   Right Sinus: No maxillary sinus tenderness or frontal sinus tenderness.  ?   Left Sinus: No maxillary sinus tenderness or frontal sinus tenderness.  ?   Mouth/Throat:  ?   Pharynx: Uvula midline.  ?Eyes:  ?   General: Lids are normal. Lids are everted, no foreign bodies appreciated.  ?   Conjunctiva/sclera: Conjunctivae normal.  ?   Pupils: Pupils are equal, round, and reactive to light.  ?Neck:  ?   Thyroid: No thyroid mass or thyromegaly.  ?   Vascular: No carotid bruit.  ?   Trachea: Trachea normal.  ?Cardiovascular:  ?   Rate and Rhythm: Normal rate and  regular rhythm.  ?   Pulses: Normal pulses.  ?   Heart sounds: Normal heart sounds, S1 normal and S2 normal. No murmur heard. ?  No friction rub. No gallop.  ?Pulmonary:  ?   Effort: Pulmonary effort is normal. No tachypnea or respiratory distress.  ?   Breath sounds: Normal breath sounds. No decreased breath sounds, wheezing, rhonchi or rales.  ?Abdominal:  ?   General: Bowel sounds are normal.  ?   Palpations: Abdomen is soft.  ?   Tenderness: There is no abdominal tenderness.  ?Musculoskeletal:  ?   Cervical back: Normal range of motion and neck supple.  ?   Right hip: No deformity, tenderness or bony tenderness. Normal range of motion.  ?   Left hip: Tenderness and bony tenderness present. Decreased range of motion.  ?   Comments: Ttp over lateral trochanteric bursa as well as over groin muscle complex anteriorly in medial thigh.  ?Skin: ?   General: Skin is warm and dry.  ?   Findings: No rash.  ?Neurological:  ?   Mental Status: She is alert.  ?Psychiatric:     ?   Mood and Affect: Mood is not anxious or depressed.     ?   Speech: Speech normal.     ?   Behavior: Behavior normal. Behavior is cooperative.     ?   Thought Content: Thought content normal.     ?   Judgment: Judgment normal.  ? ?   ?Results for orders placed or performed in visit on 05/10/20  ?Comprehensive metabolic panel  ?Result Value Ref Range  ? Sodium 139 135 - 145 mEq/L  ? Potassium 3.8 3.5 - 5.1 mEq/L  ? Chloride 102 96 - 112 mEq/L  ? CO2 28 19 - 32 mEq/L  ? Glucose, Bld 84 70 - 99 mg/dL  ? BUN 21 6 - 23 mg/dL  ? Creatinine, Ser 0.90 0.40 - 1.20 mg/dL  ? Total Bilirubin 0.6 0.2 - 1.2 mg/dL  ? Alkaline Phosphatase 49 39 - 117 U/L  ? AST 18 0 - 37 U/L  ? ALT 14 0 - 35 U/L  ? Total Protein 7.0 6.0 - 8.3 g/dL  ? Albumin 4.3 3.5 - 5.2 g/dL  ? GFR 59.72 (L) >60.00 mL/min  ? Calcium 9.7 8.4 - 10.5 mg/dL  ?Lipid panel  ?Result Value Ref Range  ? Cholesterol 233 (H) 0 - 200 mg/dL  ? Triglycerides 91.0 0.0 - 149.0 mg/dL  ? HDL 56.60 >39.00 mg/dL  ?  VLDL 18.2 0.0 - 40.0 mg/dL  ? LDL  Cholesterol 158 (H) 0 - 99 mg/dL  ? Total CHOL/HDL Ratio 4   ? NonHDL 176.29   ? ? ?This visit occurred during the SARS-CoV-2 public health emergency.  Safety protocols were in place, including screening questions prior to the visit, additional usage of staff PPE, and extensive cleaning of exam room while observing appropriate contact time as indicated for disinfecting solutions.  ? ?COVID 19 screen:  No recent travel or known exposure to [place] ?The patient denies respiratory symptoms of COVID 19 at this time. ?The importance of social distancing was discussed today.  ? ?Assessment and Plan ?Problem List Items Addressed This Visit   ? ? Left hip pain - Primary  ?  Acute on chronic, worsening ? ?Moderate response to 15 mg daily of meloxicam.  Will continue.  Given persistence and worsening of pain will evaluate with x-ray imaging.  She will likely need referral to Ortho for steroid injection.  Referral placed. ?  ?  ? Relevant Orders  ? DG HIP UNILAT WITH PELVIS 2-3 VIEWS LEFT  ? AMB referral to orthopedics  ? ?Meds ordered this encounter  ?Medications  ? meloxicam (MOBIC) 15 MG tablet  ?  Sig: Take 1 tablet (15 mg total) by mouth daily.  ?  Dispense:  30 tablet  ?  Refill:  0  ? ?Orders Placed This Encounter  ?Procedures  ? DG HIP UNILAT WITH PELVIS 2-3 VIEWS LEFT  ?  Standing Status:   Future  ?  Number of Occurrences:   1  ?  Standing Expiration Date:   04/06/2022  ?  Order Specific Question:   Reason for Exam (SYMPTOM  OR DIAGNOSIS REQUIRED)  ?  Answer:   left anterior hip pain, pain in left groin as well  ?  Order Specific Question:   Preferred imaging location?  ?  Answer:   [facility]  ? AMB referral to orthopedics  ?  Referral Priority:   Routine  ?  Referral Type:   Consultation  ?  Number of Visits Requested:   1  ? ? ?  ? ?Eliezer Lofts, MD  ? ?

## 2021-04-12 ENCOUNTER — Ambulatory Visit: Payer: Medicare Other | Admitting: Family

## 2021-04-12 ENCOUNTER — Ambulatory Visit (INDEPENDENT_AMBULATORY_CARE_PROVIDER_SITE_OTHER): Payer: Medicare Other | Admitting: Internal Medicine

## 2021-04-12 ENCOUNTER — Other Ambulatory Visit: Payer: Self-pay

## 2021-04-12 ENCOUNTER — Encounter: Payer: Self-pay | Admitting: Internal Medicine

## 2021-04-12 VITALS — BP 140/86 | HR 81 | Temp 97.6°F | Ht 60.0 in | Wt 130.8 lb

## 2021-04-12 DIAGNOSIS — H6123 Impacted cerumen, bilateral: Secondary | ICD-10-CM

## 2021-04-12 NOTE — Progress Notes (Signed)
Chief Complaint  ?Patient presents with  ? Ear Fullness  ? ?Acute  ?1. Yesterday went to get hearing aids serviced but could not be due to excess wax in b/l ears she has hearing loss at baseline and ears itching appt today for removal ? ? ?Review of Systems  ?Constitutional:  Negative for weight loss.  ?HENT:  Positive for hearing loss.   ?Eyes:  Negative for blurred vision.  ?Respiratory:  Negative for shortness of breath.   ?Cardiovascular:  Negative for chest pain.  ?Gastrointestinal:  Negative for abdominal pain and blood in stool.  ?Genitourinary:  Negative for dysuria.  ?Musculoskeletal:  Negative for falls and joint pain.  ?Skin:  Negative for rash.  ?Neurological:  Negative for headaches.  ?Psychiatric/Behavioral:  Negative for depression.   ?Past Medical History:  ?Diagnosis Date  ? Breast cancer ([place]) 04/2003  ? Invasive ductal carcinoma of the right breast  ? Complication of anesthesia   ? "slow to wake up"  ? Hard of hearing   ? Hypertension   ? Osteopenia   ? ?Past Surgical History:  ?Procedure Laterality Date  ? BREAST SURGERY  2005  ? LUMPECTOMY  ? EPISIOTOMY    ? HYSTEROSCOPY WITH D & C    ? LYMPH NODE BIOPSY N/A 06/13/2015  ? Procedure: SENTINEL  LYMPH NODE BIOPSY;  Surgeon: Janie Morning, MD;  Location: [facility];  Service: Gynecology;  Laterality: N/A;  ? PROCTOSCOPY    ? [place]    ? ROBOTIC ASSISTED TOTAL HYSTERECTOMY N/A 06/13/2015  ? Procedure: XI ROBOTIC ASSISTED TOTAL ABDOMINAL LAPAROSCOPIC HYSTERECTOMY;  Surgeon: Janie Morning, MD;  Location: [facility];  Service: Gynecology;  Laterality: N/A;  ? SALPINGOOPHORECTOMY Bilateral 06/13/2015  ? Procedure: SALPINGO OOPHORECTOMY;  Surgeon: Janie Morning, MD;  Location: [facility];  Service: Gynecology;  Laterality: Bilateral;  ? STAPENDECTOMY    ? BILATERAL  ? ?Family History  ?Problem Relation Age of Onset  ? Stroke Father   ? Hypertension Mother   ? Stroke Sister   ? Cancer Brother   ?     Esophageal cancer  ? Cancer Brother   ?     Lung smoker  ?  Parkinson's disease Brother   ? Stroke Brother   ? ?Social History  ? ?Socioeconomic History  ? Marital status: Married  ?  Spouse name: Not on file  ? Number of children: Not on file  ? Years of education: Not on file  ? Highest education level: Not on file  ?Occupational History  ? Occupation: retired  ?  Employer: Retired  ?Tobacco Use  ? Smoking status: Never  ? Smokeless tobacco: Never  ?Vaping Use  ? Vaping Use: Never used  ?Substance and Sexual Activity  ? Alcohol use: No  ? Drug use: No  ? Sexual activity: Yes  ?  Birth control/protection: Post-menopausal  ?Other Topics Concern  ? Not on file  ?Social History Narrative  ? Regular exercise--no, occ goes to [employer]  ? Diet: healthy, fruits and veggies  ? No living will, no HCPOA (reviewed 2013)  ? ?Social Determinants of Health  ? ?Financial Resource Strain: Low Risk   ? Difficulty of Paying Living Expenses: Not hard at all  ?Food Insecurity: No Food Insecurity  ? Worried About Charity fundraiser in the Last Year: Never true  ? Ran Out of Food in the Last Year: Never true  ?Transportation Needs: No Transportation Needs  ? Lack of Transportation (Medical): No  ? Lack of  Transportation (Non-Medical): No  ?Physical Activity: Insufficiently Active  ? Days of Exercise per Week: 7 days  ? Minutes of Exercise per Session: 10 min  ?Stress: No Stress Concern Present  ? Feeling of Stress : Not at all  ?Social Connections: Not on file  ?Intimate Partner Violence: Not At Risk  ? Fear of Current or Ex-Partner: No  ? Emotionally Abused: No  ? Physically Abused: No  ? Sexually Abused: No  ? ?Current Meds  ?Medication Sig  ? Cholecalciferol 1000 UNITS capsule Take 1,000 Units by mouth 2 (two) times daily.  ? Cyanocobalamin (B-12 PO) Take 1 tablet by mouth daily.   ? Flaxseed, Linseed, (FLAX SEED OIL) 1000 MG CAPS Take 1 capsule by mouth daily.  ? fluticasone (FLONASE) 50 MCG/ACT nasal spray Place 2 sprays into both nostrils daily.  ? losartan-hydrochlorothiazide (HYZAAR)  50-12.5 MG tablet TAKE 1 TABLET BY MOUTH DAILY  ? meloxicam (MOBIC) 15 MG tablet Take 1 tablet (15 mg total) by mouth daily.  ? sodium fluoride-calcium carbonate (FLORICAL) 8.3-364 MG CAPS Take 1 capsule by mouth 2 (two) times daily.  ? ?Allergies  ?Allergen Reactions  ? Morphine And Related   ?  Patient requested not to have morphine. States it is too strong for her.   ? Penicillins Rash  ?  Has patient had a PCN reaction causing immediate rash, facial/tongue/throat swelling, SOB or lightheadedness with hypotension: Yes ?Has patient had a PCN reaction causing severe rash involving mucus membranes or skin necrosis: Unknown ?Has patient had a PCN reaction that required hospitalization No ?Has patient had a PCN reaction occurring within the last 10 years: No ?If all of the above answers are "NO", then may proceed with Cephalosporin use.  ? ?No results found for this or any previous visit (from the past 2160 hour(s)). ?Objective  ?Body mass index is 25.55 kg/m?. ?Wt Readings from Last 3 Encounters:  ?04/12/21 130 lb 12.8 oz (59.3 kg)  ?04/05/21 129 lb 9.6 oz (58.8 kg)  ?02/08/21 128 lb 2 oz (58.1 kg)  ? ?Temp Readings from Last 3 Encounters:  ?04/12/21 97.6 ?F (36.4 ?C) (Oral)  ?04/05/21 97.6 ?F (36.4 ?C)  ?02/08/21 (!) 97.3 ?F (36.3 ?C) (Temporal)  ? ?BP Readings from Last 3 Encounters:  ?04/12/21 140/86  ?04/05/21 136/74  ?02/08/21 (!) 150/90  ? ?Pulse Readings from Last 3 Encounters:  ?04/12/21 81  ?04/05/21 84  ?02/08/21 92  ? ? ?Physical Exam ?Vitals and nursing note reviewed.  ?Constitutional:   ?   Appearance: Normal appearance. She is well-developed and well-groomed.  ?HENT:  ?   Head: Normocephalic and atraumatic.  ?   Right Ear: Decreased hearing noted. There is impacted cerumen.  ?   Left Ear: Decreased hearing noted. There is impacted cerumen.  ?Eyes:  ?   Conjunctiva/sclera: Conjunctivae normal.  ?   Pupils: Pupils are equal, round, and reactive to light.  ?Cardiovascular:  ?   Rate and Rhythm: Normal rate  and regular rhythm.  ?   Heart sounds: Normal heart sounds. No murmur heard. ?Pulmonary:  ?   Effort: Pulmonary effort is normal.  ?   Breath sounds: Normal breath sounds.  ?Abdominal:  ?   General: Abdomen is flat. Bowel sounds are normal.  ?   Tenderness: There is no abdominal tenderness.  ?Musculoskeletal:     ?   General: No tenderness.  ?Skin: ?   General: Skin is warm and dry.  ?Neurological:  ?   General: No focal deficit  present.  ?   Mental Status: She is alert and oriented to person, place, and time. Mental status is at baseline.  ?   Cranial Nerves: Cranial nerves 2-12 are intact.  ?   Motor: Motor function is intact.  ?   Coordination: Coordination is intact.  ?   Gait: Gait is intact.  ?Psychiatric:     ?   Attention and Perception: Attention and perception normal.     ?   Mood and Affect: Mood and affect normal.     ?   Speech: Speech normal.     ?   Behavior: Behavior normal. Behavior is cooperative.     ?   Thought Content: Thought content normal.     ?   Cognition and Memory: Cognition and memory normal.     ?   Judgment: Judgment normal.  ? ? ?Assessment  ?Plan  ?Bilateral impacted cerumen  ?Consented and tolerated b/l ear wax removal with currette only b/l ears  ?All wax removed pt satisfied and happy  ? ?Provider: Dr. Olivia Mackie McLean-Scocuzza-Internal Medicine  ?

## 2021-04-12 NOTE — Patient Instructions (Addendum)
Debrox ear wax drops 5-10 drops let ear sit up x 5-10 minutes daily x 1 week 1x out of the month ? ?MD No Physician  ? ?Dr. Redmond Baseman - ?Ear Nose Throat ?Phone Fax E-mail Address  ?[phone] Not available [address]  ? [address]  ? [place] [address]  ?   ?Specialties     ?Otolaryngology     ?      ? ? ?Earwax Buildup, Adult ?The ears produce a substance called earwax that helps keep bacteria out of the ear and protects the skin in the ear canal. Occasionally, earwax can build up in the ear and cause discomfort or hearing loss. ?What are the causes? ?This condition is caused by a buildup of earwax. Ear canals are self-cleaning. Ear wax is made in the outer part of the ear canal and generally falls out in small amounts over time. ?When the self-cleaning mechanism is not working, earwax builds up and can cause decreased hearing and discomfort. Attempting to clean ears with cotton swabs can push the earwax deep into the ear canal and cause decreased hearing and pain. ?What increases the risk? ?This condition is more likely to develop in people who: ?Clean their ears often with cotton swabs. ?Pick at their ears. ?Use earplugs or in-ear headphones often, or wear hearing aids. ?The following factors may also make you more likely to develop this condition: ?Being female. ?Being of older age. ?Naturally producing more earwax. ?Having narrow ear canals. ?Having earwax that is overly thick or sticky. ?Having excess hair in the ear canal. ?Having eczema. ?Being dehydrated. ?What are the signs or symptoms? ?Symptoms of this condition include: ?Reduced or muffled hearing. ?A feeling of fullness in the ear or feeling that the ear is plugged. ?Fluid coming from the ear. ?Ear pain or an itchy ear. ?Ringing in the ear. ?Coughing. ?Balance problems. ?An obvious piece of earwax that can be seen inside the ear canal. ?How is this diagnosed? ?This condition may be diagnosed based on: ?Your symptoms. ?Your medical  history. ?An ear exam. During the exam, your health care provider will look into your ear with an instrument called an otoscope. ?You may have tests, including a hearing test. ?How is this treated? ?This condition may be treated by: ?Using ear drops to soften the earwax. ?Having the earwax removed by a health care provider. The health care provider may: ?Flush the ear with water. ?Use an instrument that has a loop on the end (curette). ?Use a suction device. ?Having surgery to remove the wax buildup. This may be done in severe cases. ?Follow these instructions at home: ? ?Take over-the-counter and prescription medicines only as told by your health care provider. ?Do not put any objects, including cotton swabs, into your ear. You can clean the opening of your ear canal with a washcloth or facial tissue. ?Follow instructions from your health care provider about cleaning your ears. Do not overclean your ears. ?Drink enough fluid to keep your urine pale yellow. This will help to thin the earwax. ?Keep all follow-up visits as told. If earwax builds up in your ears often or if you use hearing aids, consider seeing your health care provider for routine, preventive ear cleanings. Ask your health care provider how often you should schedule your cleanings. ?If you have hearing aids, clean them according to instructions from the manufacturer and your health care provider. ?Contact a health care provider if: ?You have ear pain. ?You develop a fever. ?You  have pus or other fluid coming from your ear. ?You have hearing loss. ?You have ringing in your ears that does not go away. ?You feel like the room is spinning (vertigo). ?Your symptoms do not improve with treatment. ?Get help right away if: ?You have bleeding from the affected ear. ?You have severe ear pain. ?Summary ?Earwax can build up in the ear and cause discomfort or hearing loss. ?The most common symptoms of this condition include reduced or muffled hearing, a feeling of  fullness in the ear, or feeling that the ear is plugged. ?This condition may be diagnosed based on your symptoms, your medical history, and an ear exam. ?This condition may be treated by using ear drops to soften the earwax or by having the earwax removed by a health care provider. ?Do not put any objects, including cotton swabs, into your ear. You can clean the opening of your ear canal with a washcloth or facial tissue. ?This information is not intended to replace advice given to you by your health care provider. Make sure you discuss any questions you have with your health care provider. ?Document Revised: 05/11/2019 Document Reviewed: 05/11/2019 ?Elsevier Patient Education ? [employer]. ? ?

## 2021-04-17 ENCOUNTER — Ambulatory Visit: Payer: Medicare Other | Admitting: Orthopaedic Surgery

## 2021-04-17 ENCOUNTER — Other Ambulatory Visit: Payer: Self-pay

## 2021-04-17 ENCOUNTER — Encounter: Payer: Self-pay | Admitting: Orthopaedic Surgery

## 2021-04-17 ENCOUNTER — Telehealth: Payer: Self-pay | Admitting: Physical Medicine and Rehabilitation

## 2021-04-17 VITALS — Ht 60.0 in | Wt 129.0 lb

## 2021-04-17 DIAGNOSIS — M1612 Unilateral primary osteoarthritis, left hip: Secondary | ICD-10-CM | POA: Diagnosis not present

## 2021-04-17 NOTE — Progress Notes (Signed)
? ?Office Visit Note ?  ?Patient: [name]           ?Date of Birth: [birth year]           ?MRN: [id number] ?Visit Date: 04/17/2021 ?             ?Requested by: Angela Sanders, MD ?[address] ?[place],  [place] [address] ?PCP: Angela Sanders, MD ? ? ?Assessment & Plan: ?Visit Diagnoses:  ?1. Primary osteoarthritis of left hip   ? ? ?Plan: Based on findings impression is symptomatic eft hip degenerative joint disease.  She does have a fair amount of degenerative changes on x-ray.  We had a discussion on treatment options and their associated risks and benefits.  Based on her options she is interested in trying a cortisone injection which we will set up with Dr. Ernestina Bowers.  She will also take meloxicam as needed.  We also briefly had a discussion on a total hip replacement as a definitive treatment. ? ?Follow-Up Instructions: No follow-ups on file.  ? ?Orders:  ?Orders Placed This Encounter  ?Procedures  ? Ambulatory referral to Physical Medicine Rehab  ? ?No orders of the defined types were placed in this encounter. ? ? ? ? Procedures: ?No procedures performed ? ? ?Clinical Data: ?No additional findings. ? ? ?Subjective: ?Chief Complaint  ?Patient presents with  ? Left Hip - Pain  ? ? ?HPI ? ?[name] is a 81 year old female here for evaluation for left hip pain.  She has been dealing with this for about a year.  She originally saw her PCP for this.  She is placed on meloxicam which does help some.  She has start up pain and slight stiffness.  She reports thigh and groin pain and occasional back pain.  Denies any radicular symptoms. ? ?Review of Systems  ?Constitutional: Negative.   ?HENT: Negative.    ?Eyes: Negative.   ?Respiratory: Negative.    ?Cardiovascular: Negative.   ?Endocrine: Negative.   ?Musculoskeletal: Negative.   ?Neurological: Negative.   ?Hematological: Negative.   ?Psychiatric/Behavioral: Negative.    ?All other systems reviewed and are negative. ? ? ?Objective: ?Vital Signs: Ht 5' (1.524 m)    Wt 129 lb (58.5 kg)   BMI 25.19 kg/m?  ? ?Physical Exam ?Vitals and nursing note reviewed.  ?Constitutional:   ?   Appearance: She is well-developed.  ?HENT:  ?   Head: Normocephalic and atraumatic.  ?Pulmonary:  ?   Effort: Pulmonary effort is normal.  ?Abdominal:  ?   Palpations: Abdomen is soft.  ?Musculoskeletal:  ?   Cervical back: Neck supple.  ?Skin: ?   General: Skin is warm.  ?   Capillary Refill: Capillary refill takes less than 2 seconds.  ?Neurological:  ?   Mental Status: She is alert and oriented to person, place, and time.  ?Psychiatric:     ?   Behavior: Behavior normal.     ?   Thought Content: Thought content normal.     ?   Judgment: Judgment normal.  ? ? ?Ortho Exam ? ?Examination of left hip joint shows relatively well-preserved range of motion with pain at the extremes of flexion and internal rotation.  Lateral hip is slightly tender to palpation.  No sciatic tension signs. ? ?Specialty Comments:  ?No specialty comments available. ? ?Imaging: ?No results found. ? ? ?PMFS History: ?Patient Active Problem List  ? Diagnosis Date Noted  ? Primary osteoarthritis of left hip 04/17/2021  ?  Left hip pain 04/05/2021  ? Strain of left groin 02/08/2021  ? Acute pain of right shoulder 02/08/2021  ? Neck pain 02/08/2021  ? Gastroesophageal reflux disease 07/01/2020  ? Decreased GFR 03/20/2018  ? 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 Trochanteric bursitis of left hip 03/08/2016  ? Medicare annual wellness visit, subsequent 03/04/2016  ? History of endometrial cancer   ? Lumbar back pain with radiculopathy affecting right lower extremity 03/03/2015  ? Counseling regarding end of life decision making 12/23/2013  ? 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  ? Allergic rhinitis 01/14/2012  ? Vaginal laceration, old 02/01/2011  ? External hemorrhoids without complication 52/84/1324  ? 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 Hx of malignant neoplasm of female breast 09/09/2007  ?  HYPERCHOLESTEROLEMIA 02/24/2007  ? Essential hypertension, benign 10/10/2006  ? HEMORRHOIDS, EXTERNAL W/O COMPLICATION 40/11/2723  ? Osteopenia 10/10/2006  ? HX, PERSONAL, MALIGNANCY, BREAST 10/10/2006  ? ?Past Medical History:  ?Diagnosis Date  ? Breast cancer ([place]) 04/2003  ? Invasive ductal carcinoma of the right breast  ? Complication of anesthesia   ? "slow to wake up"  ? Hard of hearing   ? Hypertension   ? Osteopenia   ?  ?Family History  ?Problem Relation Age of Onset  ? Stroke Father   ? Hypertension Mother   ? Stroke Sister   ? Cancer Brother   ?     Esophageal cancer  ? Cancer Brother   ?     Lung smoker  ? Parkinson's disease Brother   ? Stroke Brother   ?  ?Past Surgical History:  ?Procedure Laterality Date  ? BREAST SURGERY  2005  ? LUMPECTOMY  ? EPISIOTOMY    ? HYSTEROSCOPY WITH D & C    ? LYMPH NODE BIOPSY N/A 06/13/2015  ? Procedure: SENTINEL  LYMPH NODE BIOPSY;  Surgeon: Janie Morning, MD;  Location: [facility];  Service: Gynecology;  Laterality: N/A;  ? PROCTOSCOPY    ? [place]    ? ROBOTIC ASSISTED TOTAL HYSTERECTOMY N/A 06/13/2015  ? Procedure: XI ROBOTIC ASSISTED TOTAL ABDOMINAL LAPAROSCOPIC HYSTERECTOMY;  Surgeon: Janie Morning, MD;  Location: [facility];  Service: Gynecology;  Laterality: N/A;  ? SALPINGOOPHORECTOMY Bilateral 06/13/2015  ? Procedure: SALPINGO OOPHORECTOMY;  Surgeon: Janie Morning, MD;  Location: [facility];  Service: Gynecology;  Laterality: Bilateral;  ? STAPENDECTOMY    ? BILATERAL  ? ?Social History  ? ?Occupational History  ? Occupation: retired  ?  Employer: Retired  ?Tobacco Use  ? Smoking status: Never  ? Smokeless tobacco: Never  ?Vaping Use  ? Vaping Use: Never used  ?Substance and Sexual Activity  ? Alcohol use: No  ? Drug use: No  ? Sexual activity: Yes  ?  Birth control/protection: Post-menopausal  ? ? ? ? ? ? ?

## 2021-04-17 NOTE — Telephone Encounter (Signed)
Pt's husband returning call to set an appt. Please call Pt's husband at [phone] ?

## 2021-05-03 ENCOUNTER — Ambulatory Visit: Payer: Self-pay

## 2021-05-03 ENCOUNTER — Encounter: Payer: Self-pay | Admitting: Physical Medicine and Rehabilitation

## 2021-05-03 ENCOUNTER — Ambulatory Visit (INDEPENDENT_AMBULATORY_CARE_PROVIDER_SITE_OTHER): Payer: Medicare Other | Admitting: Physical Medicine and Rehabilitation

## 2021-05-03 DIAGNOSIS — M25552 Pain in left hip: Secondary | ICD-10-CM

## 2021-05-03 NOTE — Progress Notes (Signed)
? ?  [name] - 81 y.o. female MRN [id number]  Date of birth: 12/29/1940 ? ?Office Visit Note: ?Visit Date: 05/03/2021 ?PCP: Jinny Sanders, MD ?Referred by: Jinny Sanders, MD ? ?Subjective: ?Chief Complaint  ?Patient presents with  ? Left Hip - Pain  ? ?HPI:  [name] is a 81 y.o. female who comes in today at the request of Dr. Eduard Roux for planned Left anesthetic hip arthrogram with fluoroscopic guidance.  The patient has failed conservative care including home exercise, medications, time and activity modification.  This injection will be diagnostic and hopefully therapeutic.  Please see requesting physician notes for further details and justification. ? ?ROS Otherwise per HPI. ? ?Assessment & Plan: ?Visit Diagnoses:  ?  ICD-10-CM   ?1. Pain in left hip  M25.552 Large Joint Inj: L hip joint  ?  XR C-ARM NO REPORT  ?  ?  ?Plan: No additional findings.  ? ?Meds & Orders: No orders of the defined types were placed in this encounter. ?  ?Orders Placed This Encounter  ?Procedures  ? Large Joint Inj: L hip joint  ? XR C-ARM NO REPORT  ?  ?Follow-up: Return if symptoms worsen or fail to improve.  ? ?Procedures: ?Large Joint Inj: L hip joint on 05/03/2021 2:33 PM ?Indications: diagnostic evaluation and pain ?Details: 22 G 3.5 in needle, fluoroscopy-guided anterior approach ? ?Arthrogram: No ? ?Medications: 4 mL bupivacaine 0.25 %; 60 mg triamcinolone acetonide 40 MG/ML ?Outcome: tolerated well, no immediate complications ? ?There was excellent flow of contrast producing a partial arthrogram of the hip. The patient did have relief of symptoms during the anesthetic phase of the injection. ?Procedure, treatment alternatives, risks and benefits explained, specific risks discussed. Consent was given by the patient. Immediately prior to procedure a time out was called to verify the correct patient, procedure, equipment, support staff and site/side marked as required. Patient was prepped and draped in the usual  sterile fashion.  ? ?  ?   ? ?Clinical History: ?No specialty comments available.  ? ? ? ?Objective:  VS:  HT:    WT:   BMI:     BP:   HR: bpm  TEMP: ( )  RESP:  ?Physical Exam  ? ?Imaging: ?No results found. ?

## 2021-05-03 NOTE — Progress Notes (Signed)
Pt state left hip pain. Pt state when sitting down, then having to stand up makes the pain worse. Pt state she takes pain meds and uses heat to help ease her pain. ? ?Numeric Pain Rating Scale and Functional Assessment ?Average Pain 5 ? ? ?In the last MONTH (on 0-10 scale) has pain interfered with the following? ? ?1. General activity like being  able to carry out your everyday physical activities such as walking, climbing stairs, carrying groceries, or moving a chair?  ?Rating(9) ? ? ?+Driver, -BT, -Dye Allergies. ? ?

## 2021-05-06 MED ORDER — BUPIVACAINE HCL 0.25 % IJ SOLN
4.0000 mL | INTRAMUSCULAR | Status: AC | PRN
  Administered 2021-05-03: 4 mL via INTRA_ARTICULAR

## 2021-05-06 MED ORDER — TRIAMCINOLONE ACETONIDE 40 MG/ML IJ SUSP
60.0000 mg | INTRAMUSCULAR | Status: AC | PRN
  Administered 2021-05-03: 60 mg via INTRA_ARTICULAR

## 2021-05-11 ENCOUNTER — Ambulatory Visit: Payer: Medicare Other

## 2021-05-14 ENCOUNTER — Telehealth: Payer: Self-pay | Admitting: Physical Medicine and Rehabilitation

## 2021-05-14 ENCOUNTER — Telehealth: Payer: Self-pay

## 2021-05-14 NOTE — Telephone Encounter (Signed)
Patient called and stated that she was told to contact the office if her injection in her left hip didn't help. She states that it has not and this injection was received on 05/03/2021. She is wanting a call back or a appointment to schedule a follow up ?[phone] (home)  ? ?

## 2021-05-14 NOTE — Telephone Encounter (Signed)
Patient called to schedule an appointment with Dr Ernestina Patches for her left hip and left leg. Patient said she is still in pain. The number to contact patient is [phone] or [phone] ?

## 2021-05-15 ENCOUNTER — Ambulatory Visit (INDEPENDENT_AMBULATORY_CARE_PROVIDER_SITE_OTHER): Payer: Medicare Other | Admitting: *Deleted

## 2021-05-15 DIAGNOSIS — Z0000 Encounter for general adult medical examination without abnormal findings: Secondary | ICD-10-CM | POA: Diagnosis not present

## 2021-05-15 DIAGNOSIS — Z780 Asymptomatic menopausal state: Secondary | ICD-10-CM

## 2021-05-15 NOTE — Progress Notes (Signed)
? ?Subjective:  ? [name] is a 81 y.o. female who presents for Medicare Annual (Subsequent) preventive examination. ? ?I connected with  Shalunda Lindh Bartow on 05/15/21 by a telephone enabled telemedicine application and verified that I am speaking with the correct person using two identifiers. ?  ?I discussed the limitations of evaluation and management by telemedicine. The patient expressed understanding and agreed to proceed. ? ?Patient location: home ? ?Provider location: Tele-Health not in office ? ? ? ?Review of Systems    ? ?Cardiac Risk Factors include: advanced age (>64mn, >>35women) ? ?   ?Objective:  ?  ?Today's Vitals  ? 05/15/21 1110  ?PainSc: 3   ? ?There is no height or weight on file to calculate BMI. ? ? ?  05/15/2021  ? 11:11 AM 05/10/2020  ?  9:48 AM 04/15/2019  ?  2:46 PM 03/05/2017  ?  9:40 AM 03/04/2016  ?  8:55 AM 06/13/2015  ? 10:08 AM 06/09/2015  ? 10:05 AM  ?Advanced Directives  ?Does Patient Have a Medical Advance Directive? No No No No Yes No No  ?Type of AProduction managerof Attorney    ?[place]?     No - copy requested    ?Would patient like information on creating a medical advance directive? No - Patient declined No - Patient declined No - Patient declined No - Patient declined  No - patient declined information No - patient declined information  ? ? ?Current Medications (verified) ?Outpatient Encounter Medications as of 05/15/2021  ?Medication Sig  ? Cholecalciferol 1000 UNITS capsule Take 1,000 Units by mouth 2 (two) times daily.  ? Cyanocobalamin (B-12 PO) Take 1 tablet by mouth daily.   ? Flaxseed, Linseed, (FLAX SEED OIL) 1000 MG CAPS Take 1 capsule by mouth daily.  ? fluticasone (FLONASE) 50 MCG/ACT nasal spray Place 2 sprays into both nostrils daily.  ? losartan-hydrochlorothiazide (HYZAAR) 50-12.5 MG tablet TAKE 1 TABLET BY MOUTH DAILY  ? meloxicam (MOBIC) 15 MG tablet Take 1 tablet (15 mg total) by mouth daily.  ? sodium  fluoride-calcium carbonate (FLORICAL) 8.3-364 MG CAPS Take 1 capsule by mouth 2 (two) times daily.  ? ?No facility-administered encounter medications on file as of 05/15/2021.  ? ? ?Allergies (verified) ?Morphine and related and Penicillins  ? ?History: ?Past Medical History:  ?Diagnosis Date  ? [place]  ? Invasive ductal carcinoma of the right breast  ? Complication of anesthesia   ? "slow to wake up"  ? Hard of hearing   ? Hypertension   ? Osteopenia   ? ?Past Surgical History:  ?Procedure Laterality Date  ? BREAST SURGERY  2005  ? LUMPECTOMY  ? EPISIOTOMY    ? HYSTEROSCOPY WITH D & C    ? LYMPH NODE BIOPSY N/A 06/13/2015  ? Procedure: SENTINEL  LYMPH NODE BIOPSY;  Surgeon: WJanie Morning MD;  [facility];  Service: Gynecology;  Laterality: N/A;  ? [place]CTOSCOPY    ? [place]   ? ROBOTIC ASSISTED TOTAL HYSTERECTOMY N/A 06/13/2015  ? Procedure: XI ROBOTIC ASSISTED TOTAL ABDOMINAL LAPAROSCOPIC HYSTERECTOMY;  Surgeon: WJanie Morning MD;  [facility];  Service: Gynecology;  Laterality: N/A;  ? SALPINGOOPHORECTOMY Bilateral 06/13/2015  ? Procedure: SALPINGO OOPHORECTOMY;  Surgeon: WJanie Morning MD;  [facility];  Service: Gynecology;  Laterality: Bilateral;  ? STAPENDECTOMY    ? BILATERAL  ? ?Family History  ?Problem  Relation Age of Onset  ? Stroke Father   ? Hypertension Mother   ? Stroke Sister   ? Cancer Brother   ?     Esophageal cancer  ? Cancer Brother   ?     Lung smoker  ? Parkinson's disease Brother   ? Stroke Brother   ? ?Social History  ? ?Socioeconomic History  ? Marital status: Married  ?  Spouse name: Not on file  ? Number of children: Not on file  ? Years of education: Not on file  ? Highest education level: Not on file  ?Occupational History  ? Occupation: retired  ?  Employer: Retired  ?Tobacco Use  ? Smoking status: Never  ? Smokeless tobacco: Never  ?Vaping Use  ? Vaping Use: Never used  ?Substance and Sexual Activity  ? Alcohol use: No  ? Drug use: No  ? Sexual  activity: Yes  ?  Birth control/protection: Post-menopausal  ?Other Topics Concern  ? Not on file  ?Social History Narrative  ? Regular exercise--no, occ goes to [employer]  ? Diet: healthy, fruits and veggies  ? No living will, no HCPOA (reviewed 2013)  ? ?Social Determinants of Health  ? ?Financial Resource Strain: Low Risk   ? Difficulty of Paying Living Expenses: Not hard at all  ?Food Insecurity: No Food Insecurity  ? Worried About Charity fundraiser in the Last Year: Never true  ? Ran Out of Food in the Last Year: Never true  ?Transportation Needs: No Transportation Needs  ? Lack of Transportation (Medical): No  ? Lack of Transportation (Non-Medical): No  ?Physical Activity: Insufficiently Active  ? Days of Exercise per Week: 2 days  ? Minutes of Exercise per Session: 20 min  ?Stress: No Stress Concern Present  ? Feeling of Stress : Not at all  ?Social Connections: Moderately Integrated  ? Frequency of Communication with Friends and Family: More than three times a week  ? Frequency of Social Gatherings with Friends and Family: Three times a week  ? Attends Religious Services: More than 4 times per year  ? Active Member of Clubs or Organizations: No  ? Attends Archivist Meetings: Never  ? Marital Status: Married  ? ? ?Tobacco Counseling ?Counseling given: Not Answered ? ? ?Clinical Intake: ? ?Pre-visit preparation completed: Yes ? ?Pain : 0-10 ?Pain Score: 3  ?Pain Type: Chronic pain ?Pain Location: Hip ?Pain Orientation: Left ?Pain Descriptors / Indicators: Burning, Aching ?Pain Onset: More than a month ago ?Pain Frequency: Intermittent ?Effect of Pain on Daily Activities: aleve ? ?  ? ?Nutritional Risks: None ?Diabetes: No ? ?How often do you need to have someone help you when you read instructions, pamphlets, or other written materials from your doctor or pharmacy?: 1 - Never ? ?Diabetic?  no ? ?Interpreter Needed?: No ? ?Information entered by :: Leroy Kennedy LPN ? ? ?Activities of Daily Living ? ?   05/15/2021  ? 11:16 AM  ?In your present state of health, do you have any difficulty performing the following activities:  ?Hearing? 0  ?Vision? 0  ?Difficulty concentrating or making decisions? 0  ?Walking or climbing stairs? 0  ?Dressing or bathing? 0  ?Doing errands, shopping? 0  ?Preparing Food and eating ? N  ?Using the Toilet? N  ?In the past six months, have you accidently leaked urine? N  ?Do you have problems with loss of bowel control? N  ?Managing your Medications? N  ?Managing your Finances? N  ?Housekeeping  or managing your Housekeeping? N  ? ? ?Patient Care Team: ?Jinny Sanders, MD as PCP - General ?Thelma Comp, [place] as Consulting Physician (Optometry) ?Everitt Amber, MD as Consulting Physician (Obstetrics and Gynecology) ?Allyn Kenner, MD as Consulting Physician (Dermatology) ?Bobbye Charleston, MD as Consulting Physician (Obstetrics and Gynecology) ? ?Indicate any recent Medical Services you may have received from other than [facility] providers in the past year (date may be approximate). ? ?   ?Assessment:  ? This is a routine wellness examination for Kenlyn. ? ?Hearing/Vision screen ?Hearing Screening - Comments:: Bilateral hearing aids ?Vision Screening - Comments:: Up to date ?Friendly shopping eye doctor ? ?Dietary issues and exercise activities discussed: ?Current Exercise Habits: Home exercise routine, Time (Minutes): 20, Frequency (Times/Week): 4, Weekly Exercise (Minutes/Week): 80, Intensity: Mild, Exercise limited by: orthopedic condition(s) ? ? Goals Addressed   ? ?  ?  ?  ?  ? This Visit's Progress  ?  Patient Stated     ?  Would like to get rid of hip pain ?  ? ?  ? ?Depression Screen ? ?  05/15/2021  ? 11:17 AM 05/10/2020  ?  9:52 AM 04/15/2019  ?  2:47 PM 03/20/2018  ? 10:15 AM 03/05/2017  ?  9:40 AM 03/04/2016  ?  8:55 AM 03/03/2015  ? 11:16 AM  ?PHQ 2/9 Scores  ?PHQ - 2 Score 0 0 0 0 0 0 0  ?PHQ- 9 Score  0 0  0    ?  ?Fall Risk ? ?  05/15/2021  ? 11:11 AM 05/10/2020  ?  9:51 AM 04/15/2019  ?  2:47 PM  03/20/2018  ? 10:15 AM 03/05/2017  ?  9:40 AM  ?Fall Risk   ?Falls in the past year? 0 0 0 0 No  ?Number falls in past yr: 0 0 0    ?Injury with Fall? 0 0 0    ?Risk for fall due to :  Medication side effec

## 2021-05-15 NOTE — Patient Instructions (Signed)
Ms. Bies , ?Thank you for taking time to come for your Medicare Wellness Visit. I appreciate your ongoing commitment to your health goals. Please review the following plan we discussed and let me know if I can assist you in the future.  ? ?Screening recommendations/referrals: ?Colonoscopy: no longer required ?Mammogram: up to date ?Bone Density: Education provided ?Recommended yearly ophthalmology/optometry visit for glaucoma screening and checkup ?Recommended yearly dental visit for hygiene and checkup ? ?Vaccinations: ?Influenza vaccine: up to date ?Pneumococcal vaccine: up to date ?Tdap vaccine: Education provided ?Shingles vaccine: up to date   ? ?Advanced directives: Education provided ? ?Conditions/risks identified:  ? ? ? ? ?Preventive Care 100 Years and Older, Female ?Preventive care refers to lifestyle choices and visits with your health care provider that can promote health and wellness. ?What does preventive care include? ?A yearly physical exam. This is also called an annual well check. ?Dental exams once or twice a year. ?Routine eye exams. Ask your health care provider how often you should have your eyes checked. ?Personal lifestyle choices, including: ?Daily care of your teeth and gums. ?Regular physical activity. ?Eating a healthy diet. ?Avoiding tobacco and drug use. ?Limiting alcohol use. ?Practicing safe sex. ?Taking low-dose aspirin every day. ?Taking vitamin and mineral supplements as recommended by your health care provider. ?What happens during an annual well check? ?The services and screenings done by your health care provider during your annual well check will depend on your age, overall health, lifestyle risk factors, and family history of disease. ?Counseling  ?Your health care provider may ask you questions about your: ?Alcohol use. ?Tobacco use. ?Drug use. ?Emotional well-being. ?Home and relationship well-being. ?Sexual activity. ?Eating habits. ?History of falls. ?Memory and ability  to understand (cognition). ?Work and work Statistician. ?Reproductive health. ?Screening  ?You may have the following tests or measurements: ?Height, weight, and BMI. ?Blood pressure. ?Lipid and cholesterol levels. These may be checked every 5 years, or more frequently if you are over 62 years old. ?Skin check. ?Lung cancer screening. You may have this screening every year starting at age 49 if you have a 30-pack-year history of smoking and currently smoke or have quit within the past 15 years. ?Fecal occult blood test (FOBT) of the stool. You may have this test every year starting at age 4. ?Flexible sigmoidoscopy or colonoscopy. You may have a sigmoidoscopy every 5 years or a colonoscopy every 10 years starting at age 2. ?Hepatitis C blood test. ?Hepatitis B blood test. ?Sexually transmitted disease (STD) testing. ?Diabetes screening. This is done by checking your blood sugar (glucose) after you have not eaten for a while (fasting). You may have this done every 1-3 years. ?Bone density scan. This is done to screen for osteoporosis. You may have this done starting at age 43. ?Mammogram. This may be done every 1-2 years. Talk to your health care provider about how often you should have regular mammograms. ?Talk with your health care provider about your test results, treatment options, and if necessary, the need for more tests. ?Vaccines  ?Your health care provider may recommend certain vaccines, such as: ?Influenza vaccine. This is recommended every year. ?Tetanus, diphtheria, and acellular pertussis (Tdap, Td) vaccine. You may need a Td booster every 10 years. ?Zoster vaccine. You may need this after age 45. ?Pneumococcal 13-valent conjugate (PCV13) vaccine. One dose is recommended after age 50. ?Pneumococcal polysaccharide (PPSV23) vaccine. One dose is recommended after age 54. ?Talk to your health care provider about which screenings and vaccines you  need and how often you need them. ?This information is not  intended to replace advice given to you by your health care provider. Make sure you discuss any questions you have with your health care provider. ?Document Released: 02/17/2015 Document Revised: 10/11/2015 Document Reviewed: 11/22/2014 ?Elsevier Interactive Patient Education ? 2017 [place]. ? ?Fall Prevention in the Home ?Falls can cause injuries. They can happen to people of all ages. There are many things you can do to make your home safe and to help prevent falls. ?What can I do on the outside of my home? ?Regularly fix the edges of walkways and driveways and fix any cracks. ?Remove anything that might make you trip as you walk through a door, such as a raised step or threshold. ?Trim any bushes or trees on the path to your home. ?Use bright outdoor lighting. ?Clear any walking paths of anything that might make someone trip, such as rocks or tools. ?Regularly check to see if handrails are loose or broken. Make sure that both sides of any steps have handrails. ?Any raised decks and porches should have guardrails on the edges. ?Have any leaves, snow, or ice cleared regularly. ?Use sand or salt on walking paths during winter. ?Clean up any spills in your garage right away. This includes oil or grease spills. ?What can I do in the bathroom? ?Use night lights. ?Install grab bars by the toilet and in the tub and shower. Do not use towel bars as grab bars. ?Use non-skid mats or decals in the tub or shower. ?If you need to sit down in the shower, use a plastic, non-slip stool. ?Keep the floor dry. Clean up any water that spills on the floor as soon as it happens. ?Remove soap buildup in the tub or shower regularly. ?Attach bath mats securely with double-sided non-slip rug tape. ?Do not have throw rugs and other things on the floor that can make you trip. ?What can I do in the bedroom? ?Use night lights. ?Make sure that you have a light by your bed that is easy to reach. ?Do not use any sheets or blankets that are  too big for your bed. They should not hang down onto the floor. ?Have a firm chair that has side arms. You can use this for support while you get dressed. ?Do not have throw rugs and other things on the floor that can make you trip. ?What can I do in the kitchen? ?Clean up any spills right away. ?Avoid walking on wet floors. ?Keep items that you use a lot in easy-to-reach places. ?If you need to reach something above you, use a strong step stool that has a grab bar. ?Keep electrical cords out of the way. ?Do not use floor polish or wax that makes floors slippery. If you must use wax, use non-skid floor wax. ?Do not have throw rugs and other things on the floor that can make you trip. ?What can I do with my stairs? ?Do not leave any items on the stairs. ?Make sure that there are handrails on both sides of the stairs and use them. Fix handrails that are broken or loose. Make sure that handrails are as long as the stairways. ?Check any carpeting to make sure that it is firmly attached to the stairs. Fix any carpet that is loose or worn. ?Avoid having throw rugs at the top or bottom of the stairs. If you do have throw rugs, attach them to the floor with carpet tape. ?Make sure that  you have a light switch at the top of the stairs and the bottom of the stairs. If you do not have them, ask someone to add them for you. ?What else can I do to help prevent falls? ?Wear shoes that: ?Do not have high heels. ?Have rubber bottoms. ?Are comfortable and fit you well. ?Are closed at the toe. Do not wear sandals. ?If you use a stepladder: ?Make sure that it is fully opened. Do not climb a closed stepladder. ?Make sure that both sides of the stepladder are locked into place. ?Ask someone to hold it for you, if possible. ?Clearly mark and make sure that you can see: ?Any grab bars or handrails. ?First and last steps. ?Where the edge of each step is. ?Use tools that help you move around (mobility aids) if they are needed. These  include: ?Canes. ?Walkers. ?Scooters. ?Crutches. ?Turn on the lights when you go into a dark area. Replace any light bulbs as soon as they burn out. ?Set up your furniture so you have a clear path. Avoid moving y

## 2021-05-29 ENCOUNTER — Encounter: Payer: Self-pay | Admitting: Orthopaedic Surgery

## 2021-05-29 ENCOUNTER — Ambulatory Visit: Payer: Medicare Other | Admitting: Orthopaedic Surgery

## 2021-05-29 DIAGNOSIS — M1612 Unilateral primary osteoarthritis, left hip: Secondary | ICD-10-CM

## 2021-05-29 NOTE — Progress Notes (Signed)
? ?Office Visit Note ?  ?Patient: [name]           ?Date of Birth: August 18, 1940           ?MRN: [id number] ?Visit Date: 05/29/2021 ?             ?Requested by: Jinny Sanders, MD ?[address] ?[place],  [place] [address] ?PCP: Jinny Sanders, MD ? ? ?Assessment & Plan: ?Visit Diagnoses:  ?1. Primary osteoarthritis of left hip   ? ? ?Plan: [name] returns today to follow-up for left hip DJD.  She underwent cortisone injection about a month ago with Dr. Ernestina Patches.  She is doing much better and feels like she is 10 times better than she was.  She is very happy overall. ? ?Examination of the left hip unchanged. ? ?I am very happy that [name] has responded so well to the cortisone injection.  We will see her back as needed. ? ?Follow-Up Instructions: No follow-ups on file.  ? ?Orders:  ?No orders of the defined types were placed in this encounter. ? ?No orders of the defined types were placed in this encounter. ? ? ? ? Procedures: ?No procedures performed ? ? ?Clinical Data: ?No additional findings. ? ? ?Subjective: ?Chief Complaint  ?Patient presents with  ? Left Hip - Pain  ? ? ?HPI ? ?Review of Systems ? ? ?Objective: ?Vital Signs: There were no vitals taken for this visit. ? ?Physical Exam ? ?Ortho Exam ? ?Specialty Comments:  ?No specialty comments available. ? ?Imaging: ?No results found. ? ? ?PMFS History: ?Patient Active Problem List  ? Diagnosis Date Noted  ? Primary osteoarthritis of left hip 04/17/2021  ? Left hip pain 04/05/2021  ? Strain of left groin 02/08/2021  ? Acute pain of right shoulder 02/08/2021  ? Neck pain 02/08/2021  ? Gastroesophageal reflux disease 07/01/2020  ? Decreased GFR 03/20/2018  ? Arthritis of carpometacarpal [facility]) joint of left thumb 03/08/2016  ? Trochanteric bursitis of left hip 03/08/2016  ? Medicare annual wellness visit, subsequent 03/04/2016  ? History of endometrial cancer   ? Lumbar back pain with radiculopathy affecting right lower extremity 03/03/2015  ? Counseling  regarding end of life decision making 12/23/2013  ? 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  ? Allergic rhinitis 01/14/2012  ? Vaginal laceration, old 02/01/2011  ? External hemorrhoids without complication 17/51/0258  ? 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 Hx of malignant neoplasm of female breast 09/09/2007  ? HYPERCHOLESTEROLEMIA 02/24/2007  ? Essential hypertension, benign 10/10/2006  ? HEMORRHOIDS, EXTERNAL W/O COMPLICATION 52/77/8242  ? Osteopenia 10/10/2006  ? HX, PERSONAL, MALIGNANCY, BREAST 10/10/2006  ? ?Past Medical History:  ?Diagnosis Date  ? Breast cancer ([place]) 04/2003  ? Invasive ductal carcinoma of the right breast  ? Complication of anesthesia   ? "slow to wake up"  ? Hard of hearing   ? Hypertension   ? Osteopenia   ?  ?Family History  ?Problem Relation Age of Onset  ? Stroke Father   ? Hypertension Mother   ? Stroke Sister   ? Cancer Brother   ?     Esophageal cancer  ? Cancer Brother   ?     Lung smoker  ? Parkinson's disease Brother   ? Stroke Brother   ?  ?Past Surgical History:  ?Procedure Laterality Date  ? BREAST SURGERY  2005  ? LUMPECTOMY  ? EPISIOTOMY    ? HYSTEROSCOPY WITH D & C    ? LYMPH  NODE BIOPSY N/A 06/13/2015  ? Procedure: SENTINEL  LYMPH NODE BIOPSY;  Surgeon: Janie Morning, MD;  Location: [facility];  Service: Gynecology;  Laterality: N/A;  ? PROCTOSCOPY    ? [place]    ? ROBOTIC ASSISTED TOTAL HYSTERECTOMY N/A 06/13/2015  ? Procedure: XI ROBOTIC ASSISTED TOTAL ABDOMINAL LAPAROSCOPIC HYSTERECTOMY;  Surgeon: Janie Morning, MD;  Location: [facility];  Service: Gynecology;  Laterality: N/A;  ? SALPINGOOPHORECTOMY Bilateral 06/13/2015  ? Procedure: SALPINGO OOPHORECTOMY;  Surgeon: Janie Morning, MD;  Location: [facility];  Service: Gynecology;  Laterality: Bilateral;  ? STAPENDECTOMY    ? BILATERAL  ? ?Social History  ? ?Occupational History  ? Occupation: retired  ?  Employer: Retired  ?Tobacco Use  ? Smoking status: Never  ? Smokeless tobacco: Never  ?Vaping Use  ? Vaping Use: Never used   ?Substance and Sexual Activity  ? Alcohol use: No  ? Drug use: No  ? Sexual activity: Yes  ?  Birth control/protection: Post-menopausal  ? ? ? ? ? ? ?

## 2021-07-16 ENCOUNTER — Other Ambulatory Visit: Payer: Self-pay | Admitting: Family Medicine

## 2021-07-16 NOTE — Telephone Encounter (Signed)
Please call and schedule CPE with fasting labs prior for Dr. Diona Browner.  She has already had her [place] with nurse.

## 2021-10-12 ENCOUNTER — Other Ambulatory Visit: Payer: Self-pay | Admitting: Family Medicine

## 2021-10-12 NOTE — Telephone Encounter (Signed)
Patient has been scheduled

## 2021-10-12 NOTE — Telephone Encounter (Signed)
Please schedule CPE with fasting labs prior with Dr. Diona Browner.  She has already seen nurse for [facility].

## 2021-10-17 ENCOUNTER — Other Ambulatory Visit (INDEPENDENT_AMBULATORY_CARE_PROVIDER_SITE_OTHER): Payer: Medicare Other

## 2021-10-17 ENCOUNTER — Telehealth: Payer: Self-pay | Admitting: Family Medicine

## 2021-10-17 DIAGNOSIS — I10 Essential (primary) hypertension: Secondary | ICD-10-CM | POA: Diagnosis not present

## 2021-10-17 DIAGNOSIS — E559 Vitamin D deficiency, unspecified: Secondary | ICD-10-CM

## 2021-10-17 DIAGNOSIS — E7800 Pure hypercholesterolemia, unspecified: Secondary | ICD-10-CM | POA: Diagnosis not present

## 2021-10-17 LAB — CBC WITH DIFFERENTIAL/PLATELET
Basophils Absolute: 0 10*3/uL (ref 0.0–0.1)
Basophils Relative: 0.7 % (ref 0.0–3.0)
Eosinophils Absolute: 0.2 10*3/uL (ref 0.0–0.7)
Eosinophils Relative: 2.9 % (ref 0.0–5.0)
HCT: 43.5 % (ref 36.0–46.0)
Hemoglobin: 14.5 g/dL (ref 12.0–15.0)
Lymphocytes Relative: 34.4 % (ref 12.0–46.0)
Lymphs Abs: 2.5 10*3/uL (ref 0.7–4.0)
MCHC: 33.5 g/dL (ref 30.0–36.0)
MCV: 85.5 fl (ref 78.0–100.0)
Monocytes Absolute: 0.7 10*3/uL (ref 0.1–1.0)
Monocytes Relative: 9.4 % (ref 3.0–12.0)
Neutro Abs: 3.8 10*3/uL (ref 1.4–7.7)
Neutrophils Relative %: 52.6 % (ref 43.0–77.0)
Platelets: 192 10*3/uL (ref 150.0–400.0)
RBC: 5.09 Mil/uL (ref 3.87–5.11)
RDW: 13.2 % (ref 11.5–15.5)
WBC: 7.2 10*3/uL (ref 4.0–10.5)

## 2021-10-17 LAB — COMPREHENSIVE METABOLIC PANEL
ALT: 11 U/L (ref 0–35)
AST: 17 U/L (ref 0–37)
Albumin: 4 g/dL (ref 3.5–5.2)
Alkaline Phosphatase: 44 U/L (ref 39–117)
BUN: 32 mg/dL — ABNORMAL HIGH (ref 6–23)
CO2: 27 mEq/L (ref 19–32)
Calcium: 9.7 mg/dL (ref 8.4–10.5)
Chloride: 103 mEq/L (ref 96–112)
Creatinine, Ser: 0.92 mg/dL (ref 0.40–1.20)
GFR: 57.58 mL/min — ABNORMAL LOW (ref >60.00)
Glucose, Bld: 83 mg/dL (ref 70–99)
Potassium: 4 mEq/L (ref 3.5–5.1)
Sodium: 140 mEq/L (ref 135–145)
Total Bilirubin: 0.7 mg/dL (ref 0.2–1.2)
Total Protein: 6.6 g/dL (ref 6.0–8.3)

## 2021-10-17 LAB — VITAMIN D 25 HYDROXY (VIT D DEFICIENCY, FRACTURES): VITD: 80.33 ng/mL (ref 30.00–100.00)

## 2021-10-17 LAB — LIPID PANEL
Cholesterol: 226 mg/dL — ABNORMAL HIGH (ref 0–200)
HDL: 52.4 mg/dL (ref >39.00)
LDL Cholesterol: 152 mg/dL — ABNORMAL HIGH (ref 0–99)
NonHDL: 173.18
Total CHOL/HDL Ratio: 4
Triglycerides: 106 mg/dL (ref 0.0–149.0)
VLDL: 21.2 mg/dL (ref 0.0–40.0)

## 2021-10-17 NOTE — Telephone Encounter (Signed)
-----   Message from Ellamae Sia sent at 10/12/2021  4:00 PM EDT ----- Regarding: Lab orders for Wednesday, 9.13.23 Patient is scheduled for CPX labs, please order future labs, Thanks , Karna Christmas

## 2021-10-18 NOTE — Progress Notes (Signed)
No critical labs need to be addressed urgently. We will discuss labs in detail at upcoming office visit.   

## 2021-10-24 ENCOUNTER — Ambulatory Visit (INDEPENDENT_AMBULATORY_CARE_PROVIDER_SITE_OTHER): Payer: Medicare Other | Admitting: Family Medicine

## 2021-10-24 ENCOUNTER — Encounter: Payer: Medicare Other | Admitting: Family Medicine

## 2021-10-24 VITALS — BP 154/96 | HR 83 | Temp 96.6°F | Resp 14 | Ht 60.75 in | Wt 125.0 lb

## 2021-10-24 DIAGNOSIS — Z23 Encounter for immunization: Secondary | ICD-10-CM | POA: Diagnosis not present

## 2021-10-24 DIAGNOSIS — H6981 Other specified disorders of Eustachian tube, right ear: Secondary | ICD-10-CM

## 2021-10-24 DIAGNOSIS — E7800 Pure hypercholesterolemia, unspecified: Secondary | ICD-10-CM

## 2021-10-24 DIAGNOSIS — I10 Essential (primary) hypertension: Secondary | ICD-10-CM | POA: Diagnosis not present

## 2021-10-24 DIAGNOSIS — Z0000 Encounter for general adult medical examination without abnormal findings: Secondary | ICD-10-CM

## 2021-10-24 NOTE — Assessment & Plan Note (Addendum)
Chronic, inadequate control in office today.  On recheck blood pressure was 154/96. We will continue losartan hydrochlorothiazide 50/12.5 mg p.o. daily.  She will follow her blood pressure at home and call me with update in 2 weeks.  We may need to increase her dose.

## 2021-10-24 NOTE — Progress Notes (Signed)
Patient ID: [name], female    DOB: [birth year], 81 y.o.   MRN: [id number]  This visit was conducted in person.  BP (!) 146/100   Pulse 83   Temp (!) 96.6 F (35.9 C) (Temporal)   Resp 14   Ht 5' 0.75" (1.543 m)   Wt 125 lb (56.7 kg)   SpO2 97%   BMI 23.81 kg/m    CC:  Chief Complaint  Patient presents with   Annual Exam    Part 2 AWV was on 05/15/21    Subjective:   HPI: [name] is a 81 y.o. female presenting on 10/24/2021 for Annual Exam (Part 2 AWV was on 05/15/21)  The patient presents for  complete physical and review of chronic health problems. He/She also has the following acute concerns today:  Right ear fullness, no pain.  No face pain... has post nasal drip. Occ hoarseness... worse after working outside.  She  is using saline spray.   The patient saw a LPN or RN for medicare wellness visit.  Prevention and wellness was reviewed in detail. Note reviewed and important notes copied below.  Hypertension:  Previously well controlled on losartan hydrochlorothiazide 50/12.5 mg p.o. daily. BP Readings from Last 3 Encounters:  10/24/21 (!) 146/100  04/12/21 140/86  04/05/21 136/74  Using medication without problems or lightheadedness:  none Chest pain with exertion: none Edema:none Short of breath: none Average home BPs: not checking lately Other issues:  Elevated Cholesterol: Poor control, patient refuses trial of statin.   She feels red yeast rice may have casue body ache.. so stopped. Lab Results  Component Value Date   CHOL 226 (H) 10/17/2021   HDL 52.40 10/17/2021   LDLCALC 152 (H) 10/17/2021   LDLDIRECT 148.1 11/17/2012   TRIG 106.0 10/17/2021   CHOLHDL 4 10/17/2021  Using medications without problems: Muscle aches:  Diet compliance: poor Exercise: walking some Other complaints:  Reviewed office visit note from 05/29/2021 orthopedics Dr. Erlinda Hong for osteoarthritis of the left hip..  Improved after cortisone injection.    Hx of  endometrial cancer Dx 2017: Stage 1A Grade 2 endometrial cancer S/p robotic assisted total hysterectomy, BSO, sentinel lymph node mapping on 06/13/15. no LVSI, 40% myometrial invasion, negative lymph nodes.  Followed in past  by Dr. Denman George. Surgeon Dr. Skeet Latch. Released.  History of breast cancer,   2005  Relevant past medical, surgical, family and social history reviewed and updated as indicated. Interim medical history since our last visit reviewed. Allergies and medications reviewed and updated. Outpatient Medications Prior to Visit  Medication Sig Dispense Refill   Cholecalciferol 1000 UNITS capsule Take 1,000 Units by mouth 2 (two) times daily.     Cyanocobalamin (B-12 PO) Take 1 tablet by mouth daily.      Flaxseed, Linseed, (FLAX SEED OIL) 1000 MG CAPS Take 1 capsule by mouth daily.     fluticasone (FLONASE) 50 MCG/ACT nasal spray Place 2 sprays into both nostrils daily. 16 g 11   losartan-hydrochlorothiazide (HYZAAR) 50-12.5 MG tablet TAKE 1 TABLET BY MOUTH DAILY 30 tablet 0   meloxicam (MOBIC) 15 MG tablet Take 1 tablet (15 mg total) by mouth daily. 30 tablet 0   sodium fluoride-calcium carbonate (FLORICAL) 8.3-364 MG CAPS Take 1 capsule by mouth 2 (two) times daily.     No facility-administered medications prior to visit.     Per HPI unless specifically indicated in ROS section below Review of Systems  Constitutional:  Negative for fatigue and  fever.  HENT:  Positive for postnasal drip. Negative for congestion.   Eyes:  Negative for pain.  Respiratory:  Negative for cough and shortness of breath.   Cardiovascular:  Negative for chest pain, palpitations and leg swelling.  Gastrointestinal:  Negative for abdominal pain.  Genitourinary:  Negative for dysuria and vaginal bleeding.  Musculoskeletal:  Negative for back pain.  Neurological:  Negative for syncope, light-headedness and headaches.  Psychiatric/Behavioral:  Negative for dysphoric mood.    Objective:  BP (!) 146/100    Pulse 83   Temp (!) 96.6 F (35.9 C) (Temporal)   Resp 14   Ht 5' 0.75" (1.543 m)   Wt 125 lb (56.7 kg)   SpO2 97%   BMI 23.81 kg/m   Wt Readings from Last 3 Encounters:  10/24/21 125 lb (56.7 kg)  04/17/21 129 lb (58.5 kg)  04/12/21 130 lb 12.8 oz (59.3 kg)      Physical Exam Vitals and nursing note reviewed.  Constitutional:      General: She is not in acute distress.    Appearance: Normal appearance. She is well-developed. She is not ill-appearing or toxic-appearing.  HENT:     Head: Normocephalic.     Right Ear: Hearing, ear canal and external ear normal. A middle ear effusion is present. There is no impacted cerumen.     Left Ear: Hearing, tympanic membrane, ear canal and external ear normal.  No middle ear effusion. There is no impacted cerumen.     Nose: Nose normal.  Eyes:     General: Lids are normal. Lids are everted, no foreign bodies appreciated.     Conjunctiva/sclera: Conjunctivae normal.     Pupils: Pupils are equal, round, and reactive to light.  Neck:     Thyroid: No thyroid mass or thyromegaly.     Vascular: No carotid bruit.     Trachea: Trachea normal.  Cardiovascular:     Rate and Rhythm: Normal rate and regular rhythm.     Heart sounds: Normal heart sounds, S1 normal and S2 normal. No murmur heard.    No gallop.  Pulmonary:     Effort: Pulmonary effort is normal. No respiratory distress.     Breath sounds: Normal breath sounds. No wheezing, rhonchi or rales.  Abdominal:     General: Bowel sounds are normal. There is no distension or abdominal bruit.     Palpations: Abdomen is soft. There is no fluid wave or mass.     Tenderness: There is no abdominal tenderness. There is no guarding or rebound.     Hernia: No hernia is present.  Musculoskeletal:     Cervical back: Normal range of motion and neck supple.  Lymphadenopathy:     Cervical: No cervical adenopathy.  Skin:    General: Skin is warm and dry.     Findings: No rash.  Neurological:      Mental Status: She is alert.     Cranial Nerves: No cranial nerve deficit.     Sensory: No sensory deficit.  Psychiatric:        Mood and Affect: Mood is not anxious or depressed.        Speech: Speech normal.        Behavior: Behavior normal. Behavior is cooperative.        Judgment: Judgment normal.       Results for orders placed or performed in visit on 10/17/21  VITAMIN D 25 Hydroxy (Vit-D Deficiency, Fractures)  Result Value Ref Range  VITD 80.33 30.00 - 100.00 ng/mL  Lipid panel  Result Value Ref Range   Cholesterol 226 (H) 0 - 200 mg/dL   Triglycerides 106.0 0.0 - 149.0 mg/dL   HDL 52.40 >39.00 mg/dL   VLDL 21.2 0.0 - 40.0 mg/dL   LDL Cholesterol 152 (H) 0 - 99 mg/dL   Total CHOL/HDL Ratio 4    NonHDL 173.18   Comprehensive metabolic panel  Result Value Ref Range   Sodium 140 135 - 145 mEq/L   Potassium 4.0 3.5 - 5.1 mEq/L   Chloride 103 96 - 112 mEq/L   CO2 27 19 - 32 mEq/L   Glucose, Bld 83 70 - 99 mg/dL   BUN 32 (H) 6 - 23 mg/dL   Creatinine, Ser 0.92 0.40 - 1.20 mg/dL   Total Bilirubin 0.7 0.2 - 1.2 mg/dL   Alkaline Phosphatase 44 39 - 117 U/L   AST 17 0 - 37 U/L   ALT 11 0 - 35 U/L   Total Protein 6.6 6.0 - 8.3 g/dL   Albumin 4.0 3.5 - 5.2 g/dL   GFR 57.58 (L) >60.00 mL/min   Calcium 9.7 8.4 - 10.5 mg/dL  CBC with Differential/Platelet  Result Value Ref Range   WBC 7.2 4.0 - 10.5 K/uL   RBC 5.09 3.87 - 5.11 Mil/uL   Hemoglobin 14.5 12.0 - 15.0 g/dL   HCT 43.5 36.0 - 46.0 %   MCV 85.5 78.0 - 100.0 fl   MCHC 33.5 30.0 - 36.0 g/dL   RDW 13.2 11.5 - 15.5 %   Platelets 192.0 150.0 - 400.0 K/uL   Neutrophils Relative % 52.6 43.0 - 77.0 %   Lymphocytes Relative 34.4 12.0 - 46.0 %   Monocytes Relative 9.4 3.0 - 12.0 %   Eosinophils Relative 2.9 0.0 - 5.0 %   Basophils Relative 0.7 0.0 - 3.0 %   Neutro Abs 3.8 1.4 - 7.7 K/uL   Lymphs Abs 2.5 0.7 - 4.0 K/uL   Monocytes Absolute 0.7 0.1 - 1.0 K/uL   Eosinophils Absolute 0.2 0.0 - 0.7 K/uL   Basophils  Absolute 0.0 0.0 - 0.1 K/uL     COVID 19 screen:  No recent travel or known exposure to COVID19 The patient denies respiratory symptoms of COVID 19 at this time. The importance of social distancing was discussed today.   Assessment and Plan The patient's preventative maintenance and recommended screening tests for an annual wellness exam were reviewed in full today. Brought up to date unless services declined.  Counselled on the importance of diet, exercise, and its role in overall health and mortality. The patient's FH and SH was reviewed, including their home life, tobacco status, and drug and alcohol status.    TAH Hx of endometrial cancer Vaccines: uptodate Td, Shingrix. Received COVID19 vaccine 11/02/2019 , 04/02/2019 , 03/08/2019 Flu vaccine given. Colonoscopy: 06/2005,  not indicated due to age 23:  negative 06/2020, hx of breast cancer,  DEXA:  08/2018:stable osteopenia, plan repeat in 2025 Non smoker.  Problem List Items Addressed This Visit     Essential hypertension, benign (Chronic)    Chronic, inadequate control in office today.  On recheck blood pressure was 154/96. We will continue losartan hydrochlorothiazide 50/12.5 mg p.o. daily.  She will follow her blood pressure at home and call me with update in 2 weeks.  We may need to increase her dose.      HYPERCHOLESTEROLEMIA (Chronic)    Poor control, patient refuses trial of statin.  She  Is not keen on restarting  Red yeat rice. Provided additional information on herbal treatments for cholesterol.  She will try to get back on track with low-cholesterol, low-fat diet and regular exercise.      Dysfunction of right eustachian tube    Acute, treat with Flonase 2 sprays per nostril p.o. daily.  She has upcoming follow-up with Dr. Redmond Baseman for her otosclerosis and will discuss with him as well.      Other Visit Diagnoses     Routine general medical examination at a health care facility    -  Primary   Need for  immunization against influenza       Relevant Orders   Flu Vaccine QUAD High Dose(Fluad) (Completed)       .  Eliezer Lofts, MD

## 2021-10-24 NOTE — Assessment & Plan Note (Signed)
Acute, treat with Flonase 2 sprays per nostril p.o. daily.  She has upcoming follow-up with Dr. Redmond Baseman for her otosclerosis and will discuss with him as well.

## 2021-10-24 NOTE — Assessment & Plan Note (Addendum)
Poor control, patient refuses trial of statin.  She  Is not keen on restarting  Red yeat rice. Provided additional information on herbal treatments for cholesterol.  She will try to get back on track with low-cholesterol, low-fat diet and regular exercise.

## 2021-10-24 NOTE — Patient Instructions (Addendum)
Check blood pressure at home goal < 150/90... call with BP update in 2 weeks if above goal consistently.  Work on low cholesterol diet get back to regular low impact exercise.  Start flonase 2 spray per nostril daily.   Please call the location of your choice from the menu below to schedule your Mammogram and/or Bone Density appointment.    [facility]                      Phone:  [phone], [place] [address]                                                             Services: Traditional and 3D Mammogram, [place] [facility]                 Phone: [phone] [address], [place] [address]    Service: Bone Density ONLY   *this site does NOT perform mammograms  [facility]                        Phone:  [phone] [address]                                  [place], [place] [address]                                            Services:  3D Mammogram and Bone Density     Here is some info I have gathered for you for a trusted medical source. Lipid Management With Diet, Uptodate Feb 20.2021, Marisa Hua and [place]  Although earlier, smaller trials suggested a benefit of garlic supplementation, a subsequent larger trial failed to demonstrate improvement in lipids with use of any of three different garlic preparations (raw, powdered, or aged).  Bergamot: Improvements in serum lipids have been reported in trials of patients with metabolic syndrome, nonalcoholic fatty liver disease and in hyperlipidemic patients resistant to statin treatment. However, high-quality data on the effects of bergamot are lacking.  Suggestions for you if you would like to try natural supplements to lower cholesterol.  1.Souble fiber : Psyllium In a meta-analysis of randomized  trials of patients with both normal and elevated cholesterol levels, the addition of 10.2 g/day of psyllium lowered the LDL cholesterol by an average of 12.8 mg/dL   2. Omega 3s: Mixed results in studies. Given you triglycerides are normal I would not use this.  3.Red  yeast rice ( 2.4 grams divided half in AM half in PM): Red yeast rice is a fermented rice product, most often taken as a supplement, which can improve serum cholesterol  via  method similar to prescription statins.  Red yeast rice supplements lowered total cholesterol (208 versus 251 mg/dL) and LDL cholesterol (135 versus 175 mg/dL) compared with placebo.  4. Plant sterol.. There are naturally occurring sterols and stanols in nuts, legumes, whole grains, fruits, vegetables, and plant oils. In addition, a number of manufactured products enriched with plant sterols and stanols are commercially available. The margarines containing these compounds (eg, Benecol and Take Control spreads) have been available the longest and are the most studied  In a trial of 150 patients with mild hypercholesterolemia,those consuming the fortified margarine experienced a 10 to 14 percent decrease in total cholesterol and LDL cholesterol.  5.Green Tea Catechins: .n a year-long randomized trial of more than 900 healthy postmenopausal women, green tea catechin supplements (1315 mg catechins/day) reduced total cholesterol and LDL cholesterol, increased triglycerides, and had no effect on HDL cholesterol

## 2021-10-26 ENCOUNTER — Other Ambulatory Visit: Payer: Self-pay | Admitting: Family Medicine

## 2021-10-26 DIAGNOSIS — Z1231 Encounter for screening mammogram for malignant neoplasm of breast: Secondary | ICD-10-CM

## 2021-11-08 DIAGNOSIS — H903 Sensorineural hearing loss, bilateral: Secondary | ICD-10-CM | POA: Insufficient documentation

## 2021-11-13 ENCOUNTER — Other Ambulatory Visit: Payer: Self-pay | Admitting: Family Medicine

## 2021-11-22 ENCOUNTER — Ambulatory Visit: Payer: Medicare Other

## 2021-12-01 ENCOUNTER — Ambulatory Visit
Admission: EM | Admit: 2021-12-01 | Discharge: 2021-12-01 | Disposition: A | Discharge status: Home / Self Care | Payer: Medicare Other

## 2021-12-01 DIAGNOSIS — U071 COVID-19: Secondary | ICD-10-CM | POA: Diagnosis not present

## 2021-12-01 DIAGNOSIS — R000 Tachycardia, unspecified: Secondary | ICD-10-CM | POA: Diagnosis not present

## 2021-12-01 NOTE — ED Notes (Signed)
Patient is being discharged from the Urgent Care and sent to the Emergency Department via POV . Per [facility], patient is in need of higher level of care due to Providers assessment . Patient is aware and verbalizes understanding of plan of care.  Vitals:   12/01/21 1219  BP: (!) 169/95  Pulse: (!) 122  Resp: 17  Temp: 98.1 F (36.7 C)  SpO2: 96%

## 2021-12-01 NOTE — Discharge Instructions (Signed)
Go to the emergency department as soon as you leave urgent care for further evaluation and management. 

## 2021-12-01 NOTE — ED Provider Notes (Signed)
Roderic Palau    CSN: [id number] Arrival date & time: 12/01/21  1158      History   Chief Complaint Chief Complaint  Patient presents with   Covid Positive    HPI [name] is a 81 y.o. female.   Patient presents for further evaluation after testing positive for COVID-19 with an at home test this morning.  She states that her symptoms started yesterday morning and include nasal congestion, cough, chills.  Her husband recently tested positive for COVID-19 and she has been taking care of him.  She denies any documented fevers at home but states that she has "felt feverish".  She been treating herself with Tylenol with minimal improvement.  Denies chest pain, shortness of breath, sore throat, ear pain, nausea, vomiting, diarrhea, abdominal pain.  Patient has elevated blood pressure reading and heart rate.  She reports that she has been taking her medication as prescribed.  She denies any history of elevated heart rate.     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   Dysfunction of right eustachian tube 10/24/2021   Primary osteoarthritis of left hip 04/17/2021   Left hip pain 04/05/2021   Strain of left groin 02/08/2021   Acute pain of right shoulder 02/08/2021   Neck pain 02/08/2021   Gastroesophageal reflux disease 07/01/2020   Decreased GFR 03/20/2018   Arthritis of carpometacarpal ([place]) joint of left thumb 03/08/2016   Trochanteric bursitis of left hip 03/08/2016   Medicare annual wellness visit, subsequent 03/04/2016   History of endometrial cancer    Lumbar back pain with radiculopathy affecting right lower extremity 03/03/2015   Counseling regarding end of life decision making  12/23/2013   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   Allergic rhinitis 01/14/2012   Vaginal laceration, old 02/01/2011   External hemorrhoids without complication 46/27/0350   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   Hx of malignant neoplasm of female breast 09/09/2007   HYPERCHOLESTEROLEMIA 02/24/2007   Essential hypertension, benign 10/10/2006   HEMORRHOIDS, EXTERNAL W/O COMPLICATION 09/38/1829   Osteopenia 10/10/2006   HX, PERSONAL, MALIGNANCY, BREAST 10/10/2006    Past Surgical History:  Procedure Laterality Date   BREAST SURGERY  2005   LUMPECTOMY   EPISIOTOMY     HYSTEROSCOPY WITH D & C     LYMPH NODE BIOPSY N/A 06/13/2015   Procedure: SENTINEL  LYMPH NODE BIOPSY;  Surgeon: Janie Morning, MD;  Location: [facility];  Service: Gynecology;  Laterality: N/A;   PROCTOSCOPY     PROCTOSOMY REPAIR     ROBOTIC ASSISTED TOTAL HYSTERECTOMY N/A 06/13/2015   Procedure: XI ROBOTIC ASSISTED TOTAL ABDOMINAL LAPAROSCOPIC HYSTERECTOMY;  Surgeon: Janie Morning, MD;  Location: [facility];  Service: Gynecology;  Laterality: N/A;   SALPINGOOPHORECTOMY Bilateral 06/13/2015   Procedure: SALPINGO OOPHORECTOMY;  Surgeon: Janie Morning, MD;  Location: [facility];  Service: Gynecology;  Laterality: Bilateral;   STAPENDECTOMY     BILATERAL    OB History   No obstetric history on file.      Home Medications    Prior to Admission medications   Medication Sig Start Date End Date Taking? Authorizing Provider  Cholecalciferol 1000 UNITS capsule Take  1,000 Units by mouth 2 (two) times daily.    [provider]  Cyanocobalamin (B-12 PO) Take 1 tablet by mouth daily.     [provider]  Flaxseed, Linseed, (FLAX SEED OIL) 1000 MG CAPS Take 1 capsule by mouth daily.    [provider]  fluticasone (FLONASE) 50 MCG/ACT nasal spray Place 2 sprays into both nostrils daily. 05/23/20   Bedsole, Amy E, MD  losartan-hydrochlorothiazide (HYZAAR) 50-12.5 MG tablet TAKE 1 TABLET BY MOUTH DAILY 11/13/21    Bedsole, Amy E, MD  meloxicam (MOBIC) 15 MG tablet Take 1 tablet (15 mg total) by mouth daily. 04/05/21   Bedsole, Amy E, MD  sodium fluoride-calcium carbonate (FLORICAL) 8.3-364 MG CAPS Take 1 capsule by mouth 2 (two) times daily.    [provider]    Family History Family History  Problem Relation Age of Onset   Stroke Father    Hypertension Mother    Stroke Sister    Cancer Brother        Esophageal cancer   Cancer Brother        Lung smoker   Parkinson's disease Brother    Stroke Brother     Social History Social History   Tobacco Use   Smoking status: Never   Smokeless tobacco: Never  Vaping Use   Vaping Use: Never used  Substance Use Topics   Alcohol use: No   Drug use: No     Allergies   Morphine and related and Penicillins   Review of Systems Review of Systems Per HPI  Physical Exam Triage Vital Signs ED Triage Vitals  Enc Vitals Group     BP 12/01/21 1219 (!) 169/95     Pulse Rate 12/01/21 1219 (!) 122     Resp 12/01/21 1219 17     Temp 12/01/21 1219 98.1 F (36.7 C)     Temp src --      SpO2 12/01/21 1219 96 %     Weight --      Height --      Head Circumference --      Peak Flow --      Pain Score 12/01/21 1221 0     Pain Loc --      Pain Edu? --      Excl. in [place]? --    No data found.  Updated Vital Signs BP (!) 169/95 Comment: Provider is aware and notified  Pulse (!) 122 Comment: Provider notifed and aware  Temp 98.1 F (36.7 C)   Resp 17   SpO2 96%   Visual Acuity Right Eye Distance:   Left Eye Distance:   Bilateral Distance:    Right Eye Near:   Left Eye Near:    Bilateral Near:     Physical Exam Constitutional:      General: She is not in acute distress.    Appearance: Normal appearance. She is not toxic-appearing or diaphoretic.  HENT:     Head: Normocephalic and atraumatic.     Right Ear: Tympanic membrane and ear canal normal.     Left Ear: Tympanic membrane and ear canal normal.     Nose: Congestion  present.     Mouth/Throat:     Mouth: Mucous membranes are moist.     Pharynx: No posterior oropharyngeal erythema.  Eyes:     Extraocular Movements: Extraocular movements intact.     Conjunctiva/sclera: Conjunctivae normal.     Pupils: Pupils are equal, round, and reactive to  light.  Cardiovascular:     Rate and Rhythm: Regular rhythm. Tachycardia present.     Pulses: Normal pulses.     Heart sounds: Normal heart sounds.  Pulmonary:     Effort: Pulmonary effort is normal. No respiratory distress.     Breath sounds: Normal breath sounds. No stridor. No wheezing, rhonchi or rales.  Abdominal:     General: Abdomen is flat. Bowel sounds are normal.     Palpations: Abdomen is soft.  Musculoskeletal:        General: Normal range of motion.     Cervical back: Normal range of motion.  Skin:    General: Skin is warm and dry.  Neurological:     General: No focal deficit present.     Mental Status: She is alert and oriented to person, place, and time. Mental status is at baseline.  Psychiatric:        Mood and Affect: Mood normal.        Behavior: Behavior normal.      UC Treatments / Results  Labs (all labs ordered are listed, but only abnormal results are displayed) Labs Reviewed - No data to display  EKG   Radiology No results found.  Procedures Procedures (including critical care time)  Medications Ordered in UC Medications - No data to display  Initial Impression / Assessment and Plan / UC Course  I have reviewed the triage vital signs and the nursing notes.  Pertinent labs & imaging results that were available during my care of the patient were reviewed by me and considered in my medical decision making (see chart for details).     Patient tested positive for COVID-19 with an at home test.  I am concerned given that patient has tachycardia noted on triage and on auscultation exam.  Recheck was similar.  After further review of patient's chart, she does not have  any history of irregular heart rhythm, tachycardia, palpitations so this is concerning that she could be having systemic complications from [id number]-76.  There is also no fever that could indicate a reason why she would have tachycardia as well.  Also do not have EKG here in urgent care today given technical complications to evaluate elevated heart rate.  Therefore, given patient's age, risk factors, tachycardia, I do think this warrants further evaluation and management at the emergency department given limited resources here in urgent care today.  Patient was advised to go to the emergency department for further evaluation and management and was agreeable with plan.  Patient was stable at discharge.  Agree with patient self transport to the hospital. Final Clinical Impressions(s) / UC Diagnoses   Final diagnoses:  COVID-19  Tachycardia     Discharge Instructions      Go to the emergency department as soon as you leave urgent care for further evaluation and management.    ED Prescriptions   None    PDMP not reviewed this encounter.   Teodora Medici, [place] 12/01/21 1237

## 2021-12-01 NOTE — ED Notes (Signed)
Pt BP is 169/95 with a HR of 112.  Provider [facility] is aware and notified.

## 2021-12-01 NOTE — ED Triage Notes (Signed)
Pt. Presents to UC COVID positive after taking a home test this morning. Pt. States her husband is currently COVID positive as well. Pt. Has been treating herself w/ tylenol.

## 2021-12-03 ENCOUNTER — Encounter: Payer: Self-pay | Admitting: Internal Medicine

## 2021-12-03 ENCOUNTER — Telehealth (INDEPENDENT_AMBULATORY_CARE_PROVIDER_SITE_OTHER): Payer: Medicare Other | Admitting: Internal Medicine

## 2021-12-03 ENCOUNTER — Telehealth: Payer: Self-pay

## 2021-12-03 DIAGNOSIS — U071 COVID-19: Secondary | ICD-10-CM | POA: Insufficient documentation

## 2021-12-03 MED ORDER — NIRMATRELVIR/RITONAVIR (PAXLOVID) TABLET (RENAL DOSING): 2.0000 | ORAL_TABLET | Freq: Two times a day (BID) | ORAL | 0 refills | Status: AC

## 2021-12-03 NOTE — Telephone Encounter (Signed)
[facility] Night - Client TELEPHONE ADVICE RECORD [facility] Patient Name: [name] DS Gender: Female DOB: 1941-01-11 Age: 81 Y 19 M 14 D Return Phone Number: [phone] (Primary) Address: City/ State/ Zip: [place]  [address] Client [facility] Night - Client Client Site [place] Provider Eliezer Lofts - MD Contact Type Call Who Is Calling Patient / Member / Family / Caregiver Call Type Triage / Clinical Caller Name Zaryiah Barz Relationship To Patient Spouse Return Phone Number [phone] (Primary) Chief Complaint Headache Reason for Call Symptomatic / Request for [place] states his wife tested positive for COVID. She has chills, cough and headache. He wants to get a prescription for Paxlovid for her. Translation No Nurse Assessment Nurse: Hassell Done, RN, Joelene Millin Date/Time Eilene Ghazi Time): 12/01/2021 10:39:27 AM Confirm and document reason for call. If symptomatic, describe symptoms. ---caller states his wife tested positive for covid this morning. she has a cough, headache and chills. unsure of temp. Does the patient have any new or worsening symptoms? ---Yes Will a triage be completed? ---Yes Related visit to physician within the last 2 weeks? ---No Does the PT have any chronic conditions? (i.e. diabetes, asthma, this includes High risk factors for pregnancy, etc.) ---No Is this a behavioral health or substance abuse call? ---No Guidelines Guideline Title Affirmed Question Affirmed Notes Nurse Date/Time ([place] Time) COVID-19 - Diagnosed or Suspected [1] HIGH RISK patient (e.g., weak immune system, age > 75 years, obesity with BMI 30 or higher, pregnant, chronic lung disease or other chronic medical condition) AND [2] COVID symptoms (e.g., Hassell Done, RN, Joelene Millin 12/01/2021 10:40:42 AM PLEASE NOTE: All timestamps contained  within this report are represented as [place] Standard Time. CONFIDENTIALTY NOTICE: This fax transmission is intended only for the addressee. It contains information that is legally privileged, confidential or otherwise protected from use or disclosure. If you are not the intended recipient, you are strictly prohibited from reviewing, disclosing, copying using or disseminating any of this information or taking any action in reliance on or regarding this information. If you have received this fax in error, please notify us immediately by telephone so that we can arrange for its return to [place]. Phone: [phone], Toll-Free: [phone], Fax: [phone] Page: 2 of 2 Call Id: [id number] Guidelines Guideline Title Affirmed Question Affirmed Notes Nurse Date/Time Eilene Ghazi Time) cough, fever) (Exceptions: Already seen by PCP and no new or worsening symptoms.) Disp. Time Eilene Ghazi Time) Disposition Final User 12/01/2021 10:45:30 AM Call PCP within 24 Hours Yes Hassell Done RN, Joelene Millin Final Disposition 12/01/2021 10:45:30 AM Call PCP within 24 Hours Yes Hassell Done, RN, Renea Ee Disagree/Comply Comply Caller Understands Yes PreDisposition Call Doctor Care Advice Given Per Guideline CALL PCP WITHIN 24 HOURS: * You need to discuss this with your doctor (or NP/PA) within the next 24 hours. NOTE TO TRIAGER - COVID-19 AND PATIENTS WITH HIGH RISK CONDITIONS: GENERAL CARE ADVICE FOR COVID-19 SYMPTOMS: * The symptoms are generally treated the same whether you have COVID-19, influenza or some other respiratory virus. * Cough: Use cough drops. * Feeling dehydrated: Drink extra liquids. If the air in your home is dry, use a humidifier. * Fever, Chills, and Sweats: For fever over 101 F (38.3 C), take acetaminophen every 4 to 6 hours (Adults 650 mg) OR ibuprofen every 6 to 8 hours (Adults 400 mg). Before taking any medicine, read all the instructions on the package. Do not take aspirin unless your doctor has  prescribed  it for you. Chills can sometimes come before a fever. You may feel cold in your hands and feet. You may have shivering. You may also feel sweaty as your body temperature goes down. FEVER MEDICINES: * For fevers above 101 F (38.3 C) take either acetaminophen or ibuprofen. * They are over-the-counter (OTC) drugs that help treat both fever and pain. You can buy them at the drugstore. CALL BACK IF: * You become worse CARE ADVICE given per COVID-19 - DIAGNOSED OR SUSPECTED (Adult) guideline

## 2021-12-03 NOTE — Progress Notes (Signed)
Subjective:    Patient ID: [name], female    DOB: March 06, 1940, 81 y.o.   MRN: [id number]  HPI Video virtual visit due to ongoing symptoms with COVID infection Identification done Reviewed limitations and billing and she gave consent Participants--patient in her home and I am in my office (husband is there and helped a little with history)  Husband diagnosed with COVID 4 days ago She had "hard chill" 10/27 COVID test was positive Went to urgent care 10/28----they were concerned about tachycardia and urged her to go to ER (and gave no treatment) She decided not to go--and didn't have any palpitations  Bad headache 2 days ago--that was the worst thing Now with cough Not as bad as husband's illness Did have temperature up to almost 100 No chills since the first day. No sweats----using tylenol No SOB  She does feel some better today Took some tylenol cold and fever as well  Current Outpatient Medications on File Prior to Visit  Medication Sig Dispense Refill   Cholecalciferol 1000 UNITS capsule Take 1,000 Units by mouth 2 (two) times daily.     Cyanocobalamin (B-12 PO) Take 1 tablet by mouth daily.      Flaxseed, Linseed, (FLAX SEED OIL) 1000 MG CAPS Take 1 capsule by mouth daily.     fluticasone (FLONASE) 50 MCG/ACT nasal spray Place 2 sprays into both nostrils daily. 16 g 11   losartan-hydrochlorothiazide (HYZAAR) 50-12.5 MG tablet TAKE 1 TABLET BY MOUTH DAILY 90 tablet 3   sodium fluoride-calcium carbonate (FLORICAL) 8.3-364 MG CAPS Take 1 capsule by mouth 2 (two) times daily.     No current facility-administered medications on file prior to visit.    Allergies  Allergen Reactions   Morphine And Related     Patient requested not to have morphine. States it is too strong for her.    Penicillins Rash    Has patient had a PCN reaction causing immediate rash, facial/tongue/throat swelling, SOB or lightheadedness with hypotension: Yes Has patient had a PCN reaction  causing severe rash involving mucus membranes or skin necrosis: Unknown Has patient had a PCN reaction that required hospitalization No Has patient had a PCN reaction occurring within the last 10 years: No If all of the above answers are "NO", then may proceed with Cephalosporin use.    Past Medical History:  Diagnosis Date   Breast cancer ([place]) 04/2003   Invasive ductal carcinoma of the right breast   Complication of anesthesia    "slow to wake up"   Hard of hearing    Hypertension    Osteopenia     Past Surgical History:  Procedure Laterality Date   BREAST SURGERY  2005   LUMPECTOMY   EPISIOTOMY     HYSTEROSCOPY WITH D & C     LYMPH NODE BIOPSY N/A 06/13/2015   Procedure: SENTINEL  LYMPH NODE BIOPSY;  Surgeon: Janie Morning, MD;  Location: [facility];  Service: Gynecology;  Laterality: N/A;   PROCTOSCOPY     PROCTOSOMY REPAIR     ROBOTIC ASSISTED TOTAL HYSTERECTOMY N/A 06/13/2015   Procedure: XI ROBOTIC ASSISTED TOTAL ABDOMINAL LAPAROSCOPIC HYSTERECTOMY;  Surgeon: Janie Morning, MD;  [facility];  Service: Gynecology;  Laterality: N/A;   SALPINGOOPHORECTOMY Bilateral 06/13/2015   Procedure: SALPINGO OOPHORECTOMY;  Surgeon: Janie Morning, MD;  [facility];  Service: Gynecology;  Laterality: Bilateral;   STAPENDECTOMY     BILATERAL    Family History  Problem Relation Age of Onset  Stroke Father    Hypertension Mother    Stroke Sister    Cancer Brother        Esophageal cancer   Cancer Brother        Lung smoker   Parkinson's disease Brother    Stroke Brother     Social History   Socioeconomic History   Marital status: Married    Spouse name: Not on file   Number of children: Not on file   Years of education: Not on file   Highest education level: Not on file  Occupational History   Occupation: retired    Fish farm manager: Retired  Tobacco Use   Smoking status: Never   Smokeless tobacco: Never  Scientific laboratory technician Use: Never used  Substance and Sexual  Activity   Alcohol use: No   Drug use: No   Sexual activity: Yes    Birth control/protection: Post-menopausal  Other Topics Concern   Not on file  Social History Narrative   Regular exercise--no, occ goes to [employer]   Diet: healthy, fruits and veggies   No living will, no HCPOA (reviewed 2013)   Social Determinants of Health   Financial Resource Strain: Low Risk  (05/15/2021)   Overall Financial Resource Strain (CARDIA)    Difficulty of Paying Living Expenses: Not hard at all  Food Insecurity: No Food Insecurity (05/15/2021)   Hunger Vital Sign    Worried About Running Out of Food in the Last Year: Never true    [place] in the Last Year: Never true  Transportation Needs: No Transportation Needs (05/15/2021)   PRAPARE - Hydrologist (Medical): No    Lack of Transportation (Non-Medical): No  Physical Activity: Insufficiently Active (05/15/2021)   Exercise Vital Sign    Days of Exercise per Week: 2 days    Minutes of Exercise per Session: 20 min  Stress: No Stress Concern Present (05/15/2021)   [facility]    Feeling of Stress : Not at all  Social Connections: Moderately Integrated (05/15/2021)   Social Connection and Isolation Panel [NHANES]    Frequency of Communication with Friends and Family: More than three times a week    Frequency of Social Gatherings with Friends and Family: Three times a week    Attends Religious Services: More than 4 times per year    Active Member of Clubs or Organizations: No    Attends Archivist Meetings: Never    Marital Status: Married  Human resources officer Violence: Not At Risk (05/15/2021)   Humiliation, Afraid, Rape, and Kick questionnaire    Fear of Current or Ex-Partner: No    Emotionally Abused: No    Physically Abused: No    Sexually Abused: No   Review of Systems Some nausea  No vomiting Makes herself eat---appetite is off     Objective:   Physical Exam Constitutional:      Appearance: Normal appearance.  Pulmonary:     Effort: Pulmonary effort is normal. No respiratory distress.  Neurological:     Mental Status: She is alert.            Assessment & Plan:

## 2021-12-03 NOTE — Assessment & Plan Note (Signed)
3 days in and still some tachycardia--but doesn't seem to have worsened clinically Discussed supportive care---tylenol and DM cough syrup prn Discussed antivirals--she would like this (send renal dose paxlovid) To ER if develops SOB If GI symptoms worsen, she should stop the paxlovid

## 2021-12-03 NOTE — Telephone Encounter (Signed)
I am sorry we missed the past 2 days of possible anti viral treatment. I will see how she is at the virtual visit

## 2021-12-03 NOTE — Telephone Encounter (Signed)
Per chart review tab pt was seen [facility] on 12/01/21; per UC note pt was to go to ED for fast heart beat but pt did not think she needed to go to ED. Today pulse is BP 111/70  P 100. Pt already has VV scheduled with Dr Silvio Pate today at 2 PM. No CP,or SOB.UC & ED precautions given and pt voiced understanding. Sending note to Dr Silvio Pate and Silvio Pate pool.

## 2021-12-21 ENCOUNTER — Ambulatory Visit
Admission: RE | Admit: 2021-12-21 | Discharge: 2021-12-21 | Disposition: A | Discharge status: Home / Self Care | Payer: Medicare Other | Source: Ambulatory Visit | Attending: Family Medicine | Admitting: Family Medicine

## 2021-12-21 DIAGNOSIS — Z1231 Encounter for screening mammogram for malignant neoplasm of breast: Secondary | ICD-10-CM

## 2022-03-08 ENCOUNTER — Ambulatory Visit (INDEPENDENT_AMBULATORY_CARE_PROVIDER_SITE_OTHER): Payer: Medicare Other

## 2022-03-08 ENCOUNTER — Encounter: Payer: Self-pay | Admitting: Orthopaedic Surgery

## 2022-03-08 ENCOUNTER — Ambulatory Visit: Payer: Medicare Other | Admitting: Orthopaedic Surgery

## 2022-03-08 DIAGNOSIS — M1612 Unilateral primary osteoarthritis, left hip: Secondary | ICD-10-CM

## 2022-03-08 NOTE — Progress Notes (Signed)
Office Visit Note   Patient: [name]           Date of Birth: 1940-03-26           MRN: [id number] Visit Date: 03/08/2022              Requested by: Angela Sanders, MD [place],  [place] [address] PCP: Angela Sanders, MD   Assessment & Plan: Visit Diagnoses:  1. Primary osteoarthritis of left hip     Plan: Impression is severe left hip degenerative joint disease secondary to Osteoarthritis.  Imaging shows bone on bone joint space narrowing.  At this point, conservative treatments fail to provide any significant relief and the pain is severely affecting ADLs and quality of life.  Based on treatment options, the patient has elected to move forward with a hip replacement.  We have discussed the surgical risks that include but are not limited to infection, DVT, leg length discrepancy, numbness, tingling, incomplete relief of pain.  Recovery and prognosis were also reviewed.    Current anticoagulants: No antithrombotic Postop anticoagulation: Aspirin 81 mg Diabetic: No  Prior DVT/PE: No Tobacco use: No Clearances needed for surgery: [name], PCP Anticipate discharge dispo: home   Follow-Up Instructions: No follow-ups on file.   Orders:  Orders Placed This Encounter  Procedures   XR HIP UNILAT W OR W/O PELVIS 2-3 VIEWS LEFT   No orders of the defined types were placed in this encounter.     Procedures: No procedures performed   Clinical Data: No additional findings.   Subjective: Chief Complaint  Patient presents with   Left Hip - Pain   Right Hip - Pain    HPI [name] returns today for follow-up of chronic left hip pain.  We saw her back in March 2023 and she did a cortisone injection which helped tremendously until recently.  She is experiencing significant difficulty with ADLs. Review of Systems  Constitutional: Negative.   HENT: Negative.    Eyes: Negative.   Respiratory: Negative.    Cardiovascular: Negative.   Endocrine:  Negative.   Musculoskeletal: Negative.   Neurological: Negative.   Hematological: Negative.   Psychiatric/Behavioral: Negative.    All other systems reviewed and are negative.    Objective: Vital Signs: There were no vitals taken for this visit.  Physical Exam Vitals and nursing note reviewed.  Constitutional:      Appearance: She is well-developed.  HENT:     Head: Atraumatic.     Nose: Nose normal.  Eyes:     Extraocular Movements: Extraocular movements intact.  Cardiovascular:     Pulses: Normal pulses.  Pulmonary:     Effort: Pulmonary effort is normal.  Abdominal:     Palpations: Abdomen is soft.  Musculoskeletal:     Cervical back: Neck supple.  Skin:    General: Skin is warm.     Capillary Refill: Capillary refill takes less than 2 seconds.  Neurological:     Mental Status: She is alert. Mental status is at baseline.  Psychiatric:        Behavior: Behavior normal.        Thought Content: Thought content normal.        Judgment: Judgment normal.     Ortho Exam Examination of left hip shows pain with hip flexion internal rotation and logroll and Stinchfield. Specialty Comments:  No specialty comments available.  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   Dysfunction of right eustachian tube 10/24/2021   Primary osteoarthritis of left hip 04/17/2021   Left hip pain 04/05/2021   Strain of left groin 02/08/2021   Acute pain of right shoulder 02/08/2021   Neck pain 02/08/2021   Gastroesophageal reflux disease 07/01/2020   Decreased GFR 03/20/2018   Arthritis of carpometacarpal (CMC) joint of left thumb 03/08/2016   Trochanteric bursitis of left hip 03/08/2016    Medicare annual wellness visit, subsequent 03/04/2016   History of endometrial cancer    Lumbar back pain with radiculopathy affecting right lower extremity 03/03/2015   Counseling regarding end of life decision making 12/23/2013   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   Allergic rhinitis 01/14/2012   Vaginal laceration, old 02/01/2011   External hemorrhoids without complication 63/84/6659   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   Hx of malignant neoplasm of female breast 09/09/2007   HYPERCHOLESTEROLEMIA 02/24/2007   Essential hypertension, benign 10/10/2006   HEMORRHOIDS, EXTERNAL W/O COMPLICATION 93/57/0177   Osteopenia 10/10/2006   HX, PERSONAL, MALIGNANCY, BREAST 10/10/2006   Past Medical History:  Diagnosis Date   Breast cancer ([place]) 04/2003   Invasive ductal carcinoma of the right breast   Complication of anesthesia    "slow to wake up"   Hard of hearing    Hypertension    Osteopenia     Family History  Problem Relation Age of Onset   Stroke Father    Hypertension Mother    Stroke Sister    Cancer Brother        Esophageal cancer   Cancer Brother        Lung smoker   Parkinson's disease Brother    Stroke Brother     Past Surgical History:  Procedure Laterality Date   BREAST SURGERY  2005   LUMPECTOMY   EPISIOTOMY     HYSTEROSCOPY WITH D & C     LYMPH NODE BIOPSY N/A 06/13/2015   Procedure: SENTINEL  LYMPH NODE BIOPSY;  Surgeon: Janie Morning, MD;  Location: [facility];  Service: Gynecology;  Laterality: N/A;   PROCTOSCOPY     PROCTOSOMY REPAIR     ROBOTIC ASSISTED TOTAL HYSTERECTOMY N/A 06/13/2015   Procedure: XI ROBOTIC ASSISTED TOTAL ABDOMINAL LAPAROSCOPIC HYSTERECTOMY;  Surgeon: Janie Morning, MD;  Location: [facility];  Service: Gynecology;  Laterality: N/A;   SALPINGOOPHORECTOMY Bilateral 06/13/2015   Procedure: SALPINGO OOPHORECTOMY;  Surgeon: Janie Morning, MD;  Location: [facility];  Service: Gynecology;  Laterality: Bilateral;   STAPENDECTOMY     BILATERAL   Social  History   Occupational History   Occupation: retired    Fish farm manager: Retired  Tobacco Use   Smoking status: Never   Smokeless tobacco: Never  Scientific laboratory technician Use: Never used  Substance and Sexual Activity   Alcohol use: No   Drug use: No   Sexual activity: Yes    Birth control/protection: Post-menopausal

## 2022-03-12 ENCOUNTER — Other Ambulatory Visit: Payer: Self-pay

## 2022-03-19 ENCOUNTER — Ambulatory Visit (INDEPENDENT_AMBULATORY_CARE_PROVIDER_SITE_OTHER): Payer: Medicare Other | Admitting: Family Medicine

## 2022-03-19 ENCOUNTER — Encounter: Payer: Self-pay | Admitting: Family Medicine

## 2022-03-19 VITALS — BP 136/76 | HR 98 | Temp 97.5°F | Ht 60.75 in | Wt 123.0 lb

## 2022-03-19 DIAGNOSIS — Z01818 Encounter for other preprocedural examination: Secondary | ICD-10-CM | POA: Diagnosis not present

## 2022-03-19 NOTE — Assessment & Plan Note (Signed)
Upcoming total hip replacement using spinal anesthesia No past surgical or healing complications. No past anemia, has history of mild renal insufficiency No cardiopulmonary disease or restrictions.  She is able to do approximately 3-4 METS of activity without issue.  She is very healthy for her age.  Low risk for complications with upcoming surgery

## 2022-03-19 NOTE — Progress Notes (Signed)
Patient ID: [name], female    DOB: 01/22/1941, 82 y.o.   MRN: [id number]  This visit was conducted in person.  BP (!) 140/78   Pulse 98   Temp (!) 97.5 F (36.4 C) (Temporal)   Ht 5' 0.75" (1.543 m)   Wt 123 lb (55.8 kg)   SpO2 96%   BMI 23.43 kg/m    CC:  Chief Complaint  Patient presents with   Surgical Clearance    Subjective:   HPI: [name] is a 82 y.o. female presenting on 03/19/2022 for Surgical Clearance  She has upcoming left total hip surgery planned with spinal anesthesia.  Surgeon is Dr. Erlinda Hong.  Overall she is a very healthy 82 year old with history of hypertension, high cholesterol and chronic renal insufficiency. Blood pressure in office today is borderline at 140/78 on losartan hydrochlorothiazide 50/12.5 mg p.o. daily  She is not on any anticoagulation. She has no significant cardiac or pulmonary history. Reviewed last EKG from 2017, normal sinus rhythm, no ST changes, resolved LVH  She reports from a cardio pulmonary standpoint she has no restrictions such as chest pain or shortness of breath.  She is able to walk   extended without fatigue, SOB, chest pain Whent shop[ping all day yesterday..  Able to go up to flights of stairs with some shortness of breath but would not have to stop. ABle to go up 1 flight of stairs without SOB. Last week went outside raking pine cones.   Last surgery was hysterectomy for  endometrial cancer.. no anesthesia complication, no healing complications.  Last cbc  Hg 14.5 on  10/2021 GFR 57.   Has plan to have preop labs, EKG.   At home 133/70-84 at home. HR in nml range.  Relevant past medical, surgical, family and social history reviewed and updated as indicated. Interim medical history since our last visit reviewed. Allergies and medications reviewed and updated. Outpatient Medications Prior to Visit  Medication Sig Dispense Refill   Cholecalciferol 1000 UNITS capsule Take 1,000 Units by mouth 2 (two) times  daily.     Cyanocobalamin (B-12 PO) Take 1 tablet by mouth daily.      Flaxseed, Linseed, (FLAX SEED OIL) 1000 MG CAPS Take 1 capsule by mouth daily.     fluticasone (FLONASE) 50 MCG/ACT nasal spray Place 2 sprays into both nostrils daily. 16 g 11   losartan-hydrochlorothiazide (HYZAAR) 50-12.5 MG tablet TAKE 1 TABLET BY MOUTH DAILY 90 tablet 3   sodium fluoride-calcium carbonate (FLORICAL) 8.3-364 MG CAPS Take 1 capsule by mouth 2 (two) times daily.     No facility-administered medications prior to visit.     Per HPI unless specifically indicated in ROS section below Review of Systems  Constitutional:  Negative for fatigue and fever.  HENT:  Negative for congestion.   Eyes:  Negative for pain.  Respiratory:  Negative for cough and shortness of breath.   Cardiovascular:  Negative for chest pain, palpitations and leg swelling.  Gastrointestinal:  Negative for abdominal pain.  Genitourinary:  Negative for dysuria and vaginal bleeding.  Musculoskeletal:  Negative for back pain.  Neurological:  Negative for syncope, light-headedness and headaches.  Psychiatric/Behavioral:  Negative for dysphoric mood.    Objective:  BP (!) 140/78   Pulse 98   Temp (!) 97.5 F (36.4 C) (Temporal)   Ht 5' 0.75" (1.543 m)   Wt 123 lb (55.8 kg)   SpO2 96%   BMI 23.43 kg/m   Wt Readings  from Last 3 Encounters:  03/19/22 123 lb (55.8 kg)  10/24/21 125 lb (56.7 kg)  04/17/21 129 lb (58.5 kg)      Physical Exam Constitutional:      General: She is not in acute distress.    Appearance: Normal appearance. She is well-developed. She is not ill-appearing or toxic-appearing.  HENT:     Head: Normocephalic.     Right Ear: Hearing, tympanic membrane, ear canal and external ear normal. Tympanic membrane is not erythematous, retracted or bulging.     Left Ear: Hearing, tympanic membrane, ear canal and external ear normal. Tympanic membrane is not erythematous, retracted or bulging.     Nose: No mucosal  edema or rhinorrhea.     Right Sinus: No maxillary sinus tenderness or frontal sinus tenderness.     Left Sinus: No maxillary sinus tenderness or frontal sinus tenderness.     Mouth/Throat:     Pharynx: Uvula midline.  Eyes:     General: Lids are normal. Lids are everted, no foreign bodies appreciated.     Conjunctiva/sclera: Conjunctivae normal.     Pupils: Pupils are equal, round, and reactive to light.  Neck:     Thyroid: No thyroid mass or thyromegaly.     Vascular: No carotid bruit.     Trachea: Trachea normal.  Cardiovascular:     Rate and Rhythm: Normal rate and regular rhythm.     Pulses: Normal pulses.     Heart sounds: Normal heart sounds, S1 normal and S2 normal. No murmur heard.    No friction rub. No gallop.  Pulmonary:     Effort: Pulmonary effort is normal. No tachypnea or respiratory distress.     Breath sounds: Normal breath sounds. No decreased breath sounds, wheezing, rhonchi or rales.  Abdominal:     General: Bowel sounds are normal.     Palpations: Abdomen is soft.     Tenderness: There is no abdominal tenderness.  Musculoskeletal:     Cervical back: Normal range of motion and neck supple.  Skin:    General: Skin is warm and dry.     Findings: No rash.  Neurological:     Mental Status: She is alert.  Psychiatric:        Mood and Affect: Mood is not anxious or depressed.        Speech: Speech normal.        Behavior: Behavior normal. Behavior is cooperative.        Thought Content: Thought content normal.        Judgment: Judgment normal.       Results for orders placed or performed in visit on 10/17/21  VITAMIN D 25 Hydroxy (Vit-D Deficiency, Fractures)  Result Value Ref Range   VITD 80.33 30.00 - 100.00 ng/mL  Lipid panel  Result Value Ref Range   Cholesterol 226 (H) 0 - 200 mg/dL   Triglycerides 106.0 0.0 - 149.0 mg/dL   HDL 52.40 >39.00 mg/dL   VLDL 21.2 0.0 - 40.0 mg/dL   LDL Cholesterol 152 (H) 0 - 99 mg/dL   Total CHOL/HDL Ratio 4     NonHDL 173.18   Comprehensive metabolic panel  Result Value Ref Range   Sodium 140 135 - 145 mEq/L   Potassium 4.0 3.5 - 5.1 mEq/L   Chloride 103 96 - 112 mEq/L   CO2 27 19 - 32 mEq/L   Glucose, Bld 83 70 - 99 mg/dL   BUN 32 (H) 6 - 23 mg/dL  Creatinine, Ser 0.92 0.40 - 1.20 mg/dL   Total Bilirubin 0.7 0.2 - 1.2 mg/dL   Alkaline Phosphatase 44 39 - 117 U/L   AST 17 0 - 37 U/L   ALT 11 0 - 35 U/L   Total Protein 6.6 6.0 - 8.3 g/dL   Albumin 4.0 3.5 - 5.2 g/dL   GFR 57.58 (L) >60.00 mL/min   Calcium 9.7 8.4 - 10.5 mg/dL  CBC with Differential/Platelet  Result Value Ref Range   WBC 7.2 4.0 - 10.5 K/uL   RBC 5.09 3.87 - 5.11 Mil/uL   Hemoglobin 14.5 12.0 - 15.0 g/dL   HCT 43.5 36.0 - 46.0 %   MCV 85.5 78.0 - 100.0 fl   MCHC 33.5 30.0 - 36.0 g/dL   RDW 13.2 11.5 - 15.5 %   Platelets 192.0 150.0 - 400.0 K/uL   Neutrophils Relative % 52.6 43.0 - 77.0 %   Lymphocytes Relative 34.4 12.0 - 46.0 %   Monocytes Relative 9.4 3.0 - 12.0 %   Eosinophils Relative 2.9 0.0 - 5.0 %   Basophils Relative 0.7 0.0 - 3.0 %   Neutro Abs 3.8 1.4 - 7.7 K/uL   Lymphs Abs 2.5 0.7 - 4.0 K/uL   Monocytes Absolute 0.7 0.1 - 1.0 K/uL   Eosinophils Absolute 0.2 0.0 - 0.7 K/uL   Basophils Absolute 0.0 0.0 - 0.1 K/uL    Assessment and Plan  Pre-op examination Assessment & Plan: Upcoming total hip replacement using spinal anesthesia No past surgical or healing complications. No past anemia, has history of mild renal insufficiency No cardiopulmonary disease or restrictions.  She is able to do approximately 3-4 METS of activity without issue.  She is very healthy for her age.  Low risk for complications with upcoming surgery     No follow-ups on file.   Eliezer Lofts, MD

## 2022-04-03 ENCOUNTER — Telehealth: Payer: Self-pay | Admitting: Orthopaedic Surgery

## 2022-04-03 NOTE — Telephone Encounter (Signed)
Patient is requesting an appointment to speak to Dr. Erlinda Hong prior to her surgery she states she has several questions sand need to see him. Please advise

## 2022-04-05 ENCOUNTER — Telehealth: Payer: Self-pay | Admitting: Physician Assistant

## 2022-04-05 ENCOUNTER — Other Ambulatory Visit: Payer: Self-pay | Admitting: Physician Assistant

## 2022-04-05 MED ORDER — OXYCODONE-ACETAMINOPHEN 5-325 MG PO TABS: 1.0000 | ORAL_TABLET | Freq: Four times a day (QID) | ORAL | 0 refills | Status: DC | PRN

## 2022-04-05 MED ORDER — RIVAROXABAN 10 MG PO TABS: 10.0000 mg | ORAL_TABLET | Freq: Every day | ORAL | 0 refills | Status: DC

## 2022-04-05 MED ORDER — METHOCARBAMOL 750 MG PO TABS: 750.0000 mg | ORAL_TABLET | Freq: Two times a day (BID) | ORAL | 2 refills | Status: DC | PRN

## 2022-04-05 MED ORDER — DOCUSATE SODIUM 100 MG PO CAPS: 100.0000 mg | ORAL_CAPSULE | Freq: Every day | ORAL | 2 refills | Status: AC | PRN

## 2022-04-05 MED ORDER — ONDANSETRON HCL 4 MG PO TABS: 4.0000 mg | ORAL_TABLET | Freq: Three times a day (TID) | ORAL | 0 refills | Status: DC | PRN

## 2022-04-05 NOTE — Telephone Encounter (Signed)
Can you see where patient would like post-op pain meds sent to?  I sent other po meds to [facility] in [place], but I cannot erx the percocet

## 2022-04-08 ENCOUNTER — Other Ambulatory Visit: Payer: Self-pay | Admitting: Physician Assistant

## 2022-04-08 MED ORDER — OXYCODONE-ACETAMINOPHEN 5-325 MG PO TABS: 1.0000 | ORAL_TABLET | Freq: Four times a day (QID) | ORAL | 0 refills | Status: DC | PRN

## 2022-04-08 NOTE — Telephone Encounter (Signed)
Sent [facility] med[facility].  Can you let her know that her other po meds are at [facility]?

## 2022-04-10 NOTE — Pre-Procedure Instructions (Signed)
Surgical Instructions    Your procedure is scheduled on April 15, 2022.  Report to [facility] Main Entrance "A" at 8:00 A.M., then check in with the Admitting office.  Call this number if you have problems the morning of surgery:  [phone]  If you have any questions prior to your surgery date call [phone]: Open Monday-Friday 8am-4pm If you experience any cold or flu symptoms such as cough, fever, chills, shortness of breath, etc. between now and your scheduled surgery, please notify us at the above number.     Remember:  Do not eat after midnight the night before your surgery  You may drink clear liquids until 7:30 AM the morning of your surgery.   Clear liquids allowed are: Water, Non-Citrus Juices (without pulp), Carbonated Beverages, Clear Tea, Black Coffee Only (NO MILK, CREAM OR POWDERED CREAMER of any kind), and Gatorade.  Patient Instructions  The night before surgery:  No food after midnight. ONLY clear liquids after midnight  The day of surgery (if you do NOT have diabetes):  Drink ONE (1) Pre-Surgery Clear Ensure by 7:30 AM the morning of surgery. Drink in one sitting. Do not sip.  This drink was given to you during your hospital  pre-op appointment visit.  Nothing else to drink after completing the  Pre-Surgery Clear Ensure.         If you have questions, please contact your surgeon's office.     Take these medicines the morning of surgery with A SIP OF WATER IF NEEDED:  docusate sodium (COLACE)   fluticasone (FLONASE) nasal spray      As of today, STOP taking any Aspirin (unless otherwise instructed by your surgeon) Aleve, Naproxen, Ibuprofen, Motrin, Advil, Goody's, BC's, all herbal medications, fish oil, and all vitamins. This includes your medication: diclofenac Sodium (VOLTAREN) GEL                      Do NOT Smoke (Tobacco/Vaping) for 24 hours prior to your procedure.  If you use a CPAP at night, you may bring your mask/headgear for your  overnight stay.   Contacts, glasses, piercing's, hearing aid's, dentures or partials may not be worn into surgery, please bring cases for these belongings.    For patients admitted to the hospital, discharge time will be determined by your treatment team.   Patients discharged the day of surgery will not be allowed to drive home, and someone needs to stay with them for 24 hours.  SURGICAL WAITING ROOM VISITATION Patients having surgery or a procedure may have no more than 2 support people in the waiting area - these visitors may rotate.   Children under the age of 57 must have an adult with them who is not the patient. If the patient needs to stay at the hospital during part of their recovery, the visitor guidelines for inpatient rooms apply. Pre-op nurse will coordinate an appropriate time for 1 support person to accompany patient in pre-op.  This support person may not rotate.   Please refer to the [facility] website for the visitor guidelines for Inpatients (after your surgery is over and you are in a regular room).    Special instructions:   [facility]- Preparing For Surgery  Before surgery, you can play an important role. Because skin is not sterile, your skin needs to be as free of germs as possible. You can reduce the number of germs on your skin by washing with CHG (chlorahexidine gluconate) Soap before surgery.  CHG is an antiseptic cleaner which kills germs and bonds with the skin to continue killing germs even after washing.    Oral Hygiene is also important to reduce your risk of infection.  Remember - BRUSH YOUR TEETH THE MORNING OF SURGERY WITH YOUR REGULAR TOOTHPASTE  Please do not use if you have an allergy to CHG or antibacterial soaps. If your skin becomes reddened/irritated stop using the CHG.  Do not shave (including legs and underarms) for at least 48 hours prior to first CHG shower. It is OK to shave your face.  Please follow these instructions carefully.   Shower  the NIGHT BEFORE SURGERY and the MORNING OF SURGERY  If you chose to wash your hair, wash your hair first as usual with your normal shampoo.  After you shampoo, rinse your hair and body thoroughly to remove the shampoo.  Use CHG Soap as you would any other liquid soap. You can apply CHG directly to the skin and wash gently with a scrungie or a clean washcloth.   Apply the CHG Soap to your body ONLY FROM THE NECK DOWN.  Do not use on open wounds or open sores. Avoid contact with your eyes, ears, mouth and genitals (private parts). Wash Face and genitals (private parts)  with your normal soap.   Wash thoroughly, paying special attention to the area where your surgery will be performed.  Thoroughly rinse your body with warm water from the neck down.  DO NOT shower/wash with your normal soap after using and rinsing off the CHG Soap.  Pat yourself dry with a CLEAN TOWEL.  Wear CLEAN PAJAMAS to bed the night before surgery  Place CLEAN SHEETS on your bed the night before your surgery  DO NOT SLEEP WITH PETS.   Day of Surgery: Take a shower with CHG soap. Do not wear jewelry or makeup Do not wear lotions, powders, perfumes/colognes, or deodorant. Do not shave 48 hours prior to surgery.  Men may shave face and neck. Do not bring valuables to the hospital.  [facility] is not responsible for any belongings or valuables. Do not wear nail polish, gel polish, artificial nails, or any other type of covering on natural nails (fingers and toes) If you have artificial nails or gel coating that need to be removed by a nail salon, please have this removed prior to surgery. Artificial nails or gel coating may interfere with anesthesia's ability to adequately monitor your vital signs.  Wear Clean/Comfortable clothing the morning of surgery Remember to brush your teeth WITH YOUR REGULAR TOOTHPASTE.   Please read over the following fact sheets that you were given.    If you received a COVID test  during your pre-op visit  it is requested that you wear a mask when out in public, stay away from anyone that may not be feeling well and notify your surgeon if you develop symptoms. If you have been in contact with anyone that has tested positive in the last 10 days please notify you surgeon.

## 2022-04-11 ENCOUNTER — Other Ambulatory Visit: Payer: Self-pay

## 2022-04-11 ENCOUNTER — Encounter (HOSPITAL_COMMUNITY)
Admission: RE | Admit: 2022-04-11 | Discharge: 2022-04-11 | Disposition: A | Discharge status: Home / Self Care | Payer: Medicare Other | Source: Ambulatory Visit | Attending: Orthopaedic Surgery | Admitting: Orthopaedic Surgery

## 2022-04-11 ENCOUNTER — Encounter (HOSPITAL_COMMUNITY): Payer: Self-pay

## 2022-04-11 ENCOUNTER — Ambulatory Visit: Payer: Medicare Other | Admitting: Orthopaedic Surgery

## 2022-04-11 ENCOUNTER — Telehealth: Payer: Self-pay | Admitting: Family Medicine

## 2022-04-11 ENCOUNTER — Encounter: Payer: Self-pay | Admitting: Orthopaedic Surgery

## 2022-04-11 VITALS — BP 154/82 | HR 87 | Temp 97.5°F | Resp 17 | Ht 61.0 in | Wt 124.6 lb

## 2022-04-11 DIAGNOSIS — Z01818 Encounter for other preprocedural examination: Secondary | ICD-10-CM | POA: Diagnosis not present

## 2022-04-11 DIAGNOSIS — I2510 Atherosclerotic heart disease of native coronary artery without angina pectoris: Secondary | ICD-10-CM

## 2022-04-11 DIAGNOSIS — M1612 Unilateral primary osteoarthritis, left hip: Secondary | ICD-10-CM

## 2022-04-11 DIAGNOSIS — R9431 Abnormal electrocardiogram [ECG] [EKG]: Secondary | ICD-10-CM | POA: Diagnosis not present

## 2022-04-11 HISTORY — DX: Headache, unspecified: R51.9

## 2022-04-11 HISTORY — DX: Unspecified osteoarthritis, unspecified site: M19.90

## 2022-04-11 LAB — TYPE AND SCREEN
ABO/RH(D): O POS
Antibody Screen: NEGATIVE

## 2022-04-11 LAB — CBC
HCT: 44 % (ref 36.0–46.0)
Hemoglobin: 14.8 g/dL (ref 12.0–15.0)
MCH: 29 pg (ref 26.0–34.0)
MCHC: 33.6 g/dL (ref 30.0–36.0)
MCV: 86.1 fL (ref 80.0–100.0)
Platelets: 207 10*3/uL (ref 150–400)
RBC: 5.11 MIL/uL (ref 3.87–5.11)
RDW: 12.2 % (ref 11.5–15.5)
WBC: 9.7 10*3/uL (ref 4.0–10.5)
nRBC: 0 % (ref 0.0–0.2)

## 2022-04-11 LAB — BASIC METABOLIC PANEL
Anion gap: 11 (ref 5–15)
BUN: 28 mg/dL — ABNORMAL HIGH (ref 8–23)
CO2: 27 mmol/L (ref 22–32)
Calcium: 9.9 mg/dL (ref 8.9–10.3)
Chloride: 101 mmol/L (ref 98–111)
Creatinine, Ser: 0.92 mg/dL (ref 0.44–1.00)
GFR, Estimated: >60 mL/min (ref >60)
Glucose, Bld: 84 mg/dL (ref 70–99)
Potassium: 4.1 mmol/L (ref 3.5–5.1)
Sodium: 139 mmol/L (ref 135–145)

## 2022-04-11 LAB — SURGICAL PCR SCREEN
MRSA, PCR: NEGATIVE
Staphylococcus aureus: NEGATIVE

## 2022-04-11 NOTE — Progress Notes (Signed)
PCP - Dr. Eliezer Lofts Cardiologist - Denies (per pt saw one many years ago, but does not remember who)  PPM/ICD - Denies Device Orders - n/a Rep Notified - n/a  Chest x-ray - n/a EKG - 04/11/2022 Stress Test - Denies ECHO - Denies Cardiac Cath - Denies  Sleep Study - Denies CPAP - n/a  No DM  Last dose of GLP1 agonist- n/a   GLP1 instructions: n/a  Blood Thinner Instructions: n/a Aspirin Instructions: n/a  ERAS Protcol - Clear liquids until 0730 morning of surgery PRE-SURGERY Ensure or G2- Ensure given to pt with instructions  COVID TEST- n/a   Anesthesia review: Yes. Medical Clearance obtained 03/19/2022   Patient denies shortness of breath, fever, cough and chest pain at PAT appointment. Pt denies any respiratory illness/infection in the last two months.   All instructions explained to the patient, with a verbal understanding of the material. Patient agrees to go over the instructions while at home for a better understanding. Patient also instructed to self quarantine after being tested for COVID-19. The opportunity to ask questions was provided.

## 2022-04-11 NOTE — Telephone Encounter (Signed)
Contacted [name] to schedule their annual wellness visit. Appointment made for 07/03/2022.  [place] Direct Dial: [phone]

## 2022-04-11 NOTE — Progress Notes (Signed)
Office Visit Note   Patient: [name]           Date of Birth: 1940/08/03           MRN: [id number] Visit Date: 04/11/2022              Requested by: Jinny Sanders, MD [place],  [place] [id number] PCP: Jinny Sanders, MD   Assessment & Plan: Visit Diagnoses:  1. Primary osteoarthritis of left hip     Plan: All questions answered to her satisfaction regarding the spinal and the postoperative recovery.  We look forward to seeing her in the operating theater  Follow-Up Instructions: No follow-ups on file.   Orders:  No orders of the defined types were placed in this encounter.  No orders of the defined types were placed in this encounter.     Procedures: No procedures performed   Clinical Data: No additional findings.   Subjective: Chief Complaint  Patient presents with   Left Hip - Pain    HPI  Patient is here today with more questions regarding her upcoming hip replacement.  Review of Systems   Objective: Vital Signs: There were no vitals taken for this visit.  Physical Exam  Ortho Exam  Exam is unchanged.  Specialty Comments:  No specialty comments available.  Imaging: No results found.   PMFS History: Patient Active Problem List   Diagnosis Date Noted   Pre-op examination 03/19/2022   COVID-19 virus infection 12/03/2021   Bilateral sensorineural hearing loss 11/08/2021   Dysfunction of right eustachian tube 10/24/2021   Primary osteoarthritis of left hip 04/17/2021   Left hip pain 04/05/2021   Strain of left groin 02/08/2021   Acute pain of right shoulder 02/08/2021   Neck pain 02/08/2021   Gastroesophageal reflux disease 07/01/2020   Decreased GFR 03/20/2018   Arthritis of carpometacarpal (CMC) joint of left thumb 03/08/2016   Trochanteric bursitis of left hip 03/08/2016   Medicare annual wellness visit, subsequent 03/04/2016   History of endometrial cancer    Lumbar back pain with radiculopathy affecting  right lower extremity 03/03/2015   Counseling regarding end of life decision making 12/23/2013   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   Allergic rhinitis 01/14/2012   Vaginal laceration, old 02/01/2011   External hemorrhoids without complication [id number]   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   Hx of malignant neoplasm of female breast 09/09/2007   HYPERCHOLESTEROLEMIA 02/24/2007   Essential hypertension, benign 10/10/2006   HEMORRHOIDS, EXTERNAL W/O COMPLICATION 0000000   Osteopenia 10/10/2006   HX, PERSONAL, MALIGNANCY, BREAST 10/10/2006   Past Medical History:  Diagnosis Date   Breast cancer ([place]) 04/2003   Invasive ductal carcinoma of the right breast   Complication of anesthesia    "slow to wake up"   Hard of hearing    Hypertension    Osteopenia     Family History  Problem Relation Age of Onset   Stroke Father    Hypertension Mother    Stroke Sister    Cancer Brother        Esophageal cancer   Cancer Brother        Lung smoker   Parkinson's disease Brother    Stroke Brother     Past Surgical History:  Procedure Laterality Date   BREAST SURGERY  2005   LUMPECTOMY   EPISIOTOMY     HYSTEROSCOPY WITH D & C     LYMPH NODE BIOPSY N/A 06/13/2015   Procedure:  SENTINEL  LYMPH NODE BIOPSY;  Surgeon: Janie Morning, MD;  Location: [facility];  Service: Gynecology;  Laterality: N/A;   PROCTOSCOPY     PROCTOSOMY REPAIR     ROBOTIC ASSISTED TOTAL HYSTERECTOMY N/A 06/13/2015   Procedure: XI ROBOTIC ASSISTED TOTAL ABDOMINAL LAPAROSCOPIC HYSTERECTOMY;  Surgeon: Janie Morning, MD;  Location: [facility];  Service: Gynecology;  Laterality: N/A;   SALPINGOOPHORECTOMY Bilateral 06/13/2015   Procedure: SALPINGO OOPHORECTOMY;  Surgeon: Janie Morning, MD;  Location: [facility];  Service: Gynecology;  Laterality: Bilateral;   STAPENDECTOMY     BILATERAL   Social History   Occupational History   Occupation: retired    Fish farm manager: Retired  Tobacco Use   Smoking status: Never   Smokeless tobacco:  Never  Scientific laboratory technician Use: Never used  Substance and Sexual Activity   Alcohol use: No   Drug use: No   Sexual activity: Yes    Birth control/protection: Post-menopausal

## 2022-04-15 ENCOUNTER — Ambulatory Visit (HOSPITAL_COMMUNITY): Payer: Medicare Other | Admitting: Physician Assistant

## 2022-04-15 ENCOUNTER — Observation Stay (HOSPITAL_COMMUNITY): Payer: Medicare Other

## 2022-04-15 ENCOUNTER — Ambulatory Visit (HOSPITAL_COMMUNITY): Payer: Medicare Other

## 2022-04-15 ENCOUNTER — Ambulatory Visit (HOSPITAL_BASED_OUTPATIENT_CLINIC_OR_DEPARTMENT_OTHER): Payer: Medicare Other | Admitting: Anesthesiology

## 2022-04-15 ENCOUNTER — Observation Stay (HOSPITAL_COMMUNITY)
Admission: RE | Admit: 2022-04-15 | Discharge: 2022-04-16 | Disposition: A | Discharge status: Home Health | Payer: Medicare Other | Attending: Orthopaedic Surgery | Admitting: Orthopaedic Surgery

## 2022-04-15 ENCOUNTER — Encounter (HOSPITAL_COMMUNITY)
Admission: RE | Disposition: A | Discharge status: Home Health | Payer: Self-pay | Source: Home / Self Care | Attending: Orthopaedic Surgery

## 2022-04-15 ENCOUNTER — Other Ambulatory Visit: Payer: Self-pay

## 2022-04-15 ENCOUNTER — Encounter (HOSPITAL_COMMUNITY): Payer: Self-pay | Admitting: Orthopaedic Surgery

## 2022-04-15 DIAGNOSIS — Z853 Personal history of malignant neoplasm of breast: Secondary | ICD-10-CM | POA: Insufficient documentation

## 2022-04-15 DIAGNOSIS — M1612 Unilateral primary osteoarthritis, left hip: Secondary | ICD-10-CM

## 2022-04-15 DIAGNOSIS — I10 Essential (primary) hypertension: Secondary | ICD-10-CM | POA: Diagnosis not present

## 2022-04-15 DIAGNOSIS — Z79899 Other long term (current) drug therapy: Secondary | ICD-10-CM | POA: Insufficient documentation

## 2022-04-15 DIAGNOSIS — Z96642 Presence of left artificial hip joint: Secondary | ICD-10-CM

## 2022-04-15 HISTORY — PX: TOTAL HIP ARTHROPLASTY: SHX124

## 2022-04-15 SURGERY — ARTHROPLASTY, HIP, TOTAL, ANTERIOR APPROACH
Anesthesia: Spinal | Site: Hip | Laterality: Left

## 2022-04-15 MED ORDER — RIVAROXABAN 10 MG PO TABS
10.0000 mg | ORAL_TABLET | Freq: Every day | ORAL | Status: DC
  Administered 2022-04-16: 10 mg via ORAL
  Filled 2022-04-15: qty 1

## 2022-04-15 MED ORDER — POLYETHYLENE GLYCOL 3350 17 G PO PACK
17.0000 g | PACK | Freq: Every day | ORAL | Status: DC
  Administered 2022-04-15 – 2022-04-16 (×2): 17 g via ORAL
  Filled 2022-04-15 (×2): qty 1

## 2022-04-15 MED ORDER — BUPIVACAINE IN DEXTROSE 0.75-8.25 % IT SOLN
INTRATHECAL | Status: DC | PRN
  Administered 2022-04-15: 1.6 mL via INTRATHECAL

## 2022-04-15 MED ORDER — TRANEXAMIC ACID-NACL 1000-0.7 MG/100ML-% IV SOLN
1000.0000 mg | INTRAVENOUS | Status: AC
  Administered 2022-04-15: 1000 mg via INTRAVENOUS
  Filled 2022-04-15: qty 100

## 2022-04-15 MED ORDER — ACETAMINOPHEN 500 MG PO TABS
1000.0000 mg | ORAL_TABLET | Freq: Four times a day (QID) | ORAL | Status: AC
  Administered 2022-04-15 – 2022-04-16 (×3): 1000 mg via ORAL
  Filled 2022-04-15 (×4): qty 2

## 2022-04-15 MED ORDER — OXYCODONE HCL 5 MG PO TABS
5.0000 mg | ORAL_TABLET | ORAL | Status: DC | PRN
  Administered 2022-04-15: 5 mg via ORAL
  Filled 2022-04-15 (×2): qty 1

## 2022-04-15 MED ORDER — ONDANSETRON HCL 4 MG/2ML IJ SOLN
INTRAMUSCULAR | Status: AC
  Administered 2022-04-15: 4 mg
  Filled 2022-04-15: qty 2

## 2022-04-15 MED ORDER — BUPIVACAINE-MELOXICAM ER 400-12 MG/14ML IJ SOLN
INTRAMUSCULAR | Status: AC
  Filled 2022-04-15: qty 1

## 2022-04-15 MED ORDER — PRONTOSAN WOUND IRRIGATION OPTIME
TOPICAL | Status: DC | PRN
  Administered 2022-04-15: 350 mL

## 2022-04-15 MED ORDER — ONDANSETRON HCL 4 MG PO TABS: 4.0000 mg | ORAL_TABLET | Freq: Four times a day (QID) | ORAL | Status: DC | PRN

## 2022-04-15 MED ORDER — TRANEXAMIC ACID-NACL 1000-0.7 MG/100ML-% IV SOLN
1000.0000 mg | Freq: Once | INTRAVENOUS | Status: AC
  Administered 2022-04-15: 1000 mg via INTRAVENOUS
  Filled 2022-04-15: qty 100

## 2022-04-15 MED ORDER — ACETAMINOPHEN 325 MG PO TABS
325.0000 mg | ORAL_TABLET | Freq: Four times a day (QID) | ORAL | Status: DC | PRN
  Filled 2022-04-15: qty 2

## 2022-04-15 MED ORDER — ONDANSETRON HCL 4 MG/2ML IJ SOLN: 4.0000 mg | Freq: Once | INTRAMUSCULAR | Status: DC

## 2022-04-15 MED ORDER — ALUM & MAG HYDROXIDE-SIMETH 200-200-20 MG/5ML PO SUSP: 30.0000 mL | ORAL | Status: DC | PRN

## 2022-04-15 MED ORDER — FENTANYL CITRATE (PF) 100 MCG/2ML IJ SOLN
25.0000 ug | INTRAMUSCULAR | Status: DC | PRN
  Administered 2022-04-15: 50 ug via INTRAVENOUS
  Administered 2022-04-15: 25 ug via INTRAVENOUS
  Administered 2022-04-15: 50 ug via INTRAVENOUS
  Administered 2022-04-15: 25 ug via INTRAVENOUS

## 2022-04-15 MED ORDER — ONDANSETRON HCL 4 MG/2ML IJ SOLN
4.0000 mg | Freq: Four times a day (QID) | INTRAMUSCULAR | Status: DC | PRN
  Administered 2022-04-16: 4 mg via INTRAVENOUS
  Filled 2022-04-15 (×2): qty 2

## 2022-04-15 MED ORDER — PROPOFOL 500 MG/50ML IV EMUL
INTRAVENOUS | Status: DC | PRN
  Administered 2022-04-15: 50 ug/kg/min via INTRAVENOUS

## 2022-04-15 MED ORDER — VANCOMYCIN HCL 1000 MG IV SOLR
INTRAVENOUS | Status: AC
  Filled 2022-04-15: qty 20

## 2022-04-15 MED ORDER — PROPOFOL 10 MG/ML IV BOLUS
INTRAVENOUS | Status: DC | PRN
  Administered 2022-04-15: 50 mg via INTRAVENOUS

## 2022-04-15 MED ORDER — CEFAZOLIN SODIUM-DEXTROSE 2-4 GM/100ML-% IV SOLN
2.0000 g | INTRAVENOUS | Status: AC
  Administered 2022-04-15: 2 g via INTRAVENOUS
  Filled 2022-04-15: qty 100

## 2022-04-15 MED ORDER — PANTOPRAZOLE SODIUM 40 MG PO TBEC
40.0000 mg | DELAYED_RELEASE_TABLET | Freq: Every day | ORAL | Status: DC
  Administered 2022-04-15 – 2022-04-16 (×2): 40 mg via ORAL
  Filled 2022-04-15 (×2): qty 1

## 2022-04-15 MED ORDER — PHENOL 1.4 % MT LIQD: 1.0000 | OROMUCOSAL | Status: DC | PRN

## 2022-04-15 MED ORDER — BUPIVACAINE-MELOXICAM ER 400-12 MG/14ML IJ SOLN
INTRAMUSCULAR | Status: DC | PRN
  Administered 2022-04-15: 400 mg

## 2022-04-15 MED ORDER — OXYCODONE HCL ER 10 MG PO T12A
10.0000 mg | EXTENDED_RELEASE_TABLET | Freq: Two times a day (BID) | ORAL | Status: DC
  Administered 2022-04-15: 10 mg via ORAL
  Filled 2022-04-15 (×2): qty 1

## 2022-04-15 MED ORDER — METHOCARBAMOL 500 MG PO TABS
500.0000 mg | ORAL_TABLET | Freq: Four times a day (QID) | ORAL | Status: DC | PRN
  Administered 2022-04-15 – 2022-04-16 (×2): 500 mg via ORAL
  Filled 2022-04-15 (×2): qty 1

## 2022-04-15 MED ORDER — LOSARTAN POTASSIUM-HCTZ 50-12.5 MG PO TABS: 1.0000 | ORAL_TABLET | Freq: Every day | ORAL | Status: DC

## 2022-04-15 MED ORDER — HYDROMORPHONE HCL 1 MG/ML IJ SOLN
0.5000 mg | INTRAMUSCULAR | Status: DC | PRN
  Filled 2022-04-15: qty 1

## 2022-04-15 MED ORDER — TRANEXAMIC ACID 1000 MG/10ML IV SOLN
2000.0000 mg | INTRAVENOUS | Status: DC
  Filled 2022-04-15: qty 20

## 2022-04-15 MED ORDER — SODIUM CHLORIDE 0.9 % IV SOLN: INTRAVENOUS | Status: DC

## 2022-04-15 MED ORDER — DEXAMETHASONE SODIUM PHOSPHATE 10 MG/ML IJ SOLN
10.0000 mg | Freq: Once | INTRAMUSCULAR | Status: AC
  Administered 2022-04-16: 10 mg via INTRAVENOUS
  Filled 2022-04-15: qty 1

## 2022-04-15 MED ORDER — SODIUM CHLORIDE 0.9 % IR SOLN
Status: DC | PRN
  Administered 2022-04-15: 1000 mL

## 2022-04-15 MED ORDER — ACETAMINOPHEN 500 MG PO TABS
1000.0000 mg | ORAL_TABLET | Freq: Once | ORAL | Status: AC
  Administered 2022-04-15: 1000 mg via ORAL
  Filled 2022-04-15: qty 2

## 2022-04-15 MED ORDER — ORAL CARE MOUTH RINSE: 15.0000 mL | Freq: Once | OROMUCOSAL | Status: AC

## 2022-04-15 MED ORDER — OXYCODONE HCL 5 MG PO TABS: 10.0000 mg | ORAL_TABLET | ORAL | Status: DC | PRN

## 2022-04-15 MED ORDER — METOCLOPRAMIDE HCL 5 MG/ML IJ SOLN: 5.0000 mg | Freq: Three times a day (TID) | INTRAMUSCULAR | Status: DC | PRN

## 2022-04-15 MED ORDER — VANCOMYCIN HCL 1 G IV SOLR
INTRAVENOUS | Status: DC | PRN
  Administered 2022-04-15: 1000 mg

## 2022-04-15 MED ORDER — METHOCARBAMOL 1000 MG/10ML IJ SOLN: 500.0000 mg | Freq: Four times a day (QID) | INTRAVENOUS | Status: DC | PRN

## 2022-04-15 MED ORDER — DIPHENHYDRAMINE HCL 12.5 MG/5ML PO ELIX: 25.0000 mg | ORAL_SOLUTION | ORAL | Status: DC | PRN

## 2022-04-15 MED ORDER — FERROUS SULFATE 325 (65 FE) MG PO TABS
325.0000 mg | ORAL_TABLET | Freq: Three times a day (TID) | ORAL | Status: DC
  Administered 2022-04-15 – 2022-04-16 (×2): 325 mg via ORAL
  Filled 2022-04-15 (×2): qty 1

## 2022-04-15 MED ORDER — LACTATED RINGERS IV SOLN: INTRAVENOUS | Status: DC

## 2022-04-15 MED ORDER — MENTHOL 3 MG MT LOZG: 1.0000 | LOZENGE | OROMUCOSAL | Status: DC | PRN

## 2022-04-15 MED ORDER — LOSARTAN POTASSIUM 50 MG PO TABS
50.0000 mg | ORAL_TABLET | Freq: Every day | ORAL | Status: DC
  Administered 2022-04-15 – 2022-04-16 (×2): 50 mg via ORAL
  Filled 2022-04-15 (×2): qty 1

## 2022-04-15 MED ORDER — PHENYLEPHRINE HCL-NACL 20-0.9 MG/250ML-% IV SOLN
INTRAVENOUS | Status: DC | PRN
  Administered 2022-04-15: 20 ug/min via INTRAVENOUS

## 2022-04-15 MED ORDER — 0.9 % SODIUM CHLORIDE (POUR BTL) OPTIME
TOPICAL | Status: DC | PRN
  Administered 2022-04-15: 1000 mL

## 2022-04-15 MED ORDER — FENTANYL CITRATE (PF) 100 MCG/2ML IJ SOLN
INTRAMUSCULAR | Status: AC
  Filled 2022-04-15: qty 2

## 2022-04-15 MED ORDER — MAGNESIUM CITRATE PO SOLN: 1.0000 | Freq: Once | ORAL | Status: DC | PRN

## 2022-04-15 MED ORDER — HYDROCHLOROTHIAZIDE 12.5 MG PO TABS
12.5000 mg | ORAL_TABLET | Freq: Every day | ORAL | Status: DC
  Administered 2022-04-15 – 2022-04-16 (×2): 12.5 mg via ORAL
  Filled 2022-04-15 (×2): qty 1

## 2022-04-15 MED ORDER — TRANEXAMIC ACID 1000 MG/10ML IV SOLN
INTRAVENOUS | Status: DC | PRN
  Administered 2022-04-15: 2000 mg via TOPICAL

## 2022-04-15 MED ORDER — METOCLOPRAMIDE HCL 5 MG PO TABS: 5.0000 mg | ORAL_TABLET | Freq: Three times a day (TID) | ORAL | Status: DC | PRN

## 2022-04-15 MED ORDER — SORBITOL 70 % SOLN: 30.0000 mL | Freq: Every day | Status: DC | PRN

## 2022-04-15 MED ORDER — DOCUSATE SODIUM 100 MG PO CAPS
100.0000 mg | ORAL_CAPSULE | Freq: Two times a day (BID) | ORAL | Status: DC
  Administered 2022-04-15 – 2022-04-16 (×2): 100 mg via ORAL
  Filled 2022-04-15 (×2): qty 1

## 2022-04-15 MED ORDER — CEFAZOLIN SODIUM-DEXTROSE 2-4 GM/100ML-% IV SOLN
2.0000 g | Freq: Two times a day (BID) | INTRAVENOUS | Status: AC
  Administered 2022-04-15: 2 g via INTRAVENOUS
  Filled 2022-04-15: qty 100

## 2022-04-15 MED ORDER — CHLORHEXIDINE GLUCONATE 0.12 % MT SOLN
15.0000 mL | Freq: Once | OROMUCOSAL | Status: AC
  Administered 2022-04-15: 15 mL via OROMUCOSAL
  Filled 2022-04-15: qty 15

## 2022-04-15 MED ORDER — POVIDONE-IODINE 10 % EX SWAB
2.0000 | Freq: Once | CUTANEOUS | Status: AC
  Administered 2022-04-15: 2 via TOPICAL

## 2022-04-15 SURGICAL SUPPLY — 69 items
ADH SKN CLS APL DERMABOND .7 (GAUZE/BANDAGES/DRESSINGS) ×1
BAG COUNTER SPONGE SURGICOUNT (BAG) ×1 IMPLANT
BAG DECANTER FOR FLEXI CONT (MISCELLANEOUS) ×1 IMPLANT
BAG SPNG CNTER NS LX DISP (BAG) ×1
BLADE SAG 18X100X1.27 (BLADE) ×1 IMPLANT
COOLER ICEMAN CLASSIC (MISCELLANEOUS) IMPLANT
COVER PERINEAL POST (MISCELLANEOUS) ×1 IMPLANT
COVER SURGICAL LIGHT HANDLE (MISCELLANEOUS) ×1 IMPLANT
CUP ACET PINNACLE SECTR 48MM (Joint) IMPLANT
DERMABOND ADVANCED .7 DNX12 (GAUZE/BANDAGES/DRESSINGS) IMPLANT
DRAPE C-ARM 42X72 X-RAY (DRAPES) ×1 IMPLANT
DRAPE POUCH INSTRU U-SHP 10X18 (DRAPES) ×1 IMPLANT
DRAPE STERI IOBAN 125X83 (DRAPES) ×1 IMPLANT
DRAPE U-SHAPE 47X51 STRL (DRAPES) ×2 IMPLANT
DRSG AQUACEL AG ADV 3.5X10 (GAUZE/BANDAGES/DRESSINGS) ×1 IMPLANT
DURAPREP 26ML APPLICATOR (WOUND CARE) ×2 IMPLANT
ELECT BLADE 4.0 EZ CLEAN MEGAD (MISCELLANEOUS) ×1
ELECT REM PT RETURN 9FT ADLT (ELECTROSURGICAL) ×1
ELECTRODE BLDE 4.0 EZ CLN MEGD (MISCELLANEOUS) ×1 IMPLANT
ELECTRODE REM PT RTRN 9FT ADLT (ELECTROSURGICAL) ×1 IMPLANT
GLOVE BIOGEL PI IND STRL 7.0 (GLOVE) ×2 IMPLANT
GLOVE BIOGEL PI IND STRL 7.5 (GLOVE) ×5 IMPLANT
GLOVE ECLIPSE 7.0 STRL STRAW (GLOVE) ×2 IMPLANT
GLOVE SKINSENSE STRL SZ7.5 (GLOVE) ×1 IMPLANT
GLOVE SURG SYN 7.5  E (GLOVE) ×2
GLOVE SURG SYN 7.5 E (GLOVE) ×2 IMPLANT
GLOVE SURG SYN 7.5 PF PI (GLOVE) ×2 IMPLANT
GLOVE SURG UNDER POLY LF SZ7 (GLOVE) ×3 IMPLANT
GLOVE SURG UNDER POLY LF SZ7.5 (GLOVE) ×2 IMPLANT
GOWN STRL REUS W/ TWL LRG LVL3 (GOWN DISPOSABLE) IMPLANT
GOWN STRL REUS W/ TWL XL LVL3 (GOWN DISPOSABLE) ×1 IMPLANT
GOWN STRL REUS W/TWL LRG LVL3 (GOWN DISPOSABLE)
GOWN STRL REUS W/TWL XL LVL3 (GOWN DISPOSABLE) ×1
GOWN STRL SURGICAL XL XLNG (GOWN DISPOSABLE) ×1 IMPLANT
GOWN TOGA ZIPPER T7+ PEEL AWAY (MISCELLANEOUS) ×2 IMPLANT
HANDPIECE INTERPULSE COAX TIP (DISPOSABLE) ×1
HEAD FEM STD 32X+5 STRL (Hips) IMPLANT
HOOD PEEL AWAY T7 (MISCELLANEOUS) ×1 IMPLANT
IV NS IRRIG 3000ML ARTHROMATIC (IV SOLUTION) ×1 IMPLANT
KIT BASIN OR (CUSTOM PROCEDURE TRAY) ×1 IMPLANT
MARKER SKIN DUAL TIP RULER LAB (MISCELLANEOUS) ×1 IMPLANT
NDL SPNL 18GX3.5 QUINCKE PK (NEEDLE) ×1 IMPLANT
NEEDLE SPNL 18GX3.5 QUINCKE PK (NEEDLE) ×1 IMPLANT
PACK TOTAL JOINT (CUSTOM PROCEDURE TRAY) ×1 IMPLANT
PACK UNIVERSAL I (CUSTOM PROCEDURE TRAY) ×1 IMPLANT
PINN ALTRX NEUT ID X OD 32X48 IMPLANT
PINNSECTOR W/GRIP ACE CUP 48MM (Joint) ×1 IMPLANT
SCREW 6.5MMX25MM (Screw) IMPLANT
SET HNDPC FAN SPRY TIP SCT (DISPOSABLE) ×1 IMPLANT
SOLUTION PRONTOSAN WOUND 350ML (IRRIGATION / IRRIGATOR) ×1 IMPLANT
STAPLER VISISTAT 35W (STAPLE) IMPLANT
STEM FEM SZ3 STD ACTIS (Stem) IMPLANT
SUT ETHIBOND 2 V 37 (SUTURE) ×1 IMPLANT
SUT ETHIBOND NAB CT1 #1 30IN (SUTURE) IMPLANT
SUT ETHILON 4 0 PS 2 18 (SUTURE) IMPLANT
SUT VIC AB 0 CT1 27 (SUTURE) ×1
SUT VIC AB 0 CT1 27XBRD ANBCTR (SUTURE) ×1 IMPLANT
SUT VIC AB 1 CT1 36 (SUTURE) IMPLANT
SUT VIC AB 1 CTX 36 (SUTURE) ×1
SUT VIC AB 1 CTX36XBRD ANBCTR (SUTURE) ×1 IMPLANT
SUT VIC AB 2-0 CT1 27 (SUTURE) ×3
SUT VIC AB 2-0 CT1 TAPERPNT 27 (SUTURE) ×2 IMPLANT
SYR 50ML LL SCALE MARK (SYRINGE) ×1 IMPLANT
TOWEL GREEN STERILE (TOWEL DISPOSABLE) ×1 IMPLANT
TRAY CATH INTERMITTENT SS 16FR (CATHETERS) IMPLANT
TRAY FOLEY W/BAG SLVR 16FR (SET/KITS/TRAYS/PACK) ×1
TRAY FOLEY W/BAG SLVR 16FR ST (SET/KITS/TRAYS/PACK) IMPLANT
TUBE SUCT ARGYLE STRL (TUBING) ×1 IMPLANT
YANKAUER SUCT BULB TIP NO VENT (SUCTIONS) ×1 IMPLANT

## 2022-04-15 NOTE — Progress Notes (Signed)
PHARMACY NOTE:  ANTIMICROBIAL RENAL DOSAGE ADJUSTMENT  Current antimicrobial regimen includes a mismatch between antimicrobial dosage and estimated renal function.  As per policy approved by the Pharmacy & Therapeutics and Medical Executive Committees, the antimicrobial dosage will be adjusted accordingly.  Current antimicrobial dosage:  Cefazolin 2gm IV q6h x 2  Indication: Surgical prophylaxis  Renal Function:  Estimated Creatinine Clearance: 35 mL/min (by C-G formula based on SCr of 0.92 mg/dL). '[]'$      On intermittent HD, scheduled: '[]'$      On CRRT    Antimicrobial dosage has been changed to:  Cefazolin 2gm IV q12h x 1  Additional comments:   [name] A. Levada Dy, PharmD, BCPS, FNKF Clinical Pharmacist [place] Please utilize Amion for appropriate phone number to reach the unit pharmacist ([place])  04/15/2022 3:21 PM

## 2022-04-15 NOTE — Anesthesia Postprocedure Evaluation (Signed)
Anesthesia Post Note  Patient: Kailynn Rougeau [place]  Procedure(s) Performed: LEFT TOTAL HIP ARTHROPLASTY ANTERIOR APPROACH (Left: Hip)     Patient location during evaluation: PACU Anesthesia Type: Spinal Level of consciousness: oriented and awake and alert Pain management: pain level controlled Vital Signs Assessment: post-procedure vital signs reviewed and stable Respiratory status: spontaneous breathing, respiratory function stable and patient connected to nasal cannula oxygen Cardiovascular status: blood pressure returned to baseline and stable Postop Assessment: no headache, no backache and no apparent nausea or vomiting Anesthetic complications: no  No notable events documented.  Last Vitals:  Vitals:   04/15/22 1415 04/15/22 1430  BP: (!) 138/97 (!) 157/83  Pulse: 64 61  Resp: 18 (!) 9  Temp:    SpO2: 97% 93%    Last Pain:  Vitals:   04/15/22 1300  TempSrc:   PainSc: Asleep                 Joshuan Bolander L Byrne [place]

## 2022-04-15 NOTE — Anesthesia Procedure Notes (Signed)
Procedure Name: MAC Date/Time: 04/15/2022 10:31 AM  Performed by: Darletta Moll, CRNAPre-anesthesia Checklist: Patient identified, Emergency Drugs available, Suction available and Patient being monitored Patient Re-evaluated:Patient Re-evaluated prior to induction Oxygen Delivery Method: Nasal cannula

## 2022-04-15 NOTE — Op Note (Signed)
LEFT TOTAL HIP ARTHROPLASTY ANTERIOR APPROACH  Procedure Note [name]   [id number]  Pre-op Diagnosis: left hip osteoarthritis     Post-op Diagnosis: same  Operative Findings Severe DJD left hip Large periacetabular ostephytes   Operative Procedures  1. Total hip replacement; Left hip; uncemented [id number]   Surgeon: Frankey Shown, M.D.  Assist: Madalyn Rob, PA-C   Anesthesia: spinal  Prosthesis: Depuy Acetabulum: Pinnacle 48 mm Femur: Actis 3 STD Head: 32 mm size: +5 Liner: +4 Bearing Type: metal/poly  Total Hip Arthroplasty (Anterior Approach) Op Note:  After informed consent was obtained and the operative extremity marked in the holding area, the patient was brought back to the operating room and placed supine on the HANA table. Next, the operative extremity was prepped and draped in normal sterile fashion. Surgical timeout occurred verifying patient identification, surgical site, surgical procedure and administration of antibiotics.  A bikini incision was made in the groin crease.  A Hueter approach to the hip was performed, using the interval between tensor fascia lata and sartorius.  Dissection was carried bluntly down onto the anterior hip capsule. The lateral femoral circumflex vessels were identified and coagulated. A capsulotomy was performed and the capsular flaps tagged for later repair.  The neck osteotomy was performed. The femoral head was removed which showed severe wear, the acetabular rim was cleared of soft tissue and osteophytes and attention was turned to reaming the acetabulum.  Sequential reaming was performed under fluoroscopic guidance down to the floor of the cotyloid fossa. We reamed to a size 47 mm, and then impacted the acetabular shell. A 25 mm cancellous screw was placed through the shell for added fixation.  The liner was then placed after irrigation and attention turned to the femur.  After placing the femoral hook, the leg was taken to  externally rotated, extended and adducted position taking care to perform soft tissue releases to allow for adequate mobilization of the femur. Soft tissue was cleared from the shoulder of the greater trochanter and the hook elevator used to improve exposure of the proximal femur. Sequential broaching performed up to a size 3. Trial neck and head were placed. The leg was brought back up to neutral and the construct reduced.  Antibiotic irrigation was placed in the surgical wound.  The position and sizing of components, offset and leg lengths were checked using fluoroscopy. Stability of the construct was checked in extension and external rotation without any subluxation, shuck or impingement of prosthesis. We dislocated the prosthesis, dropped the leg back into position, removed trial components, and irrigated copiously. The final stem and head was then placed, the leg brought back up, the system reduced and fluoroscopy used to verify positioning.  We irrigated, obtained hemostasis and closed the capsule using #2 ethibond suture.  One gram of vancomycin powder was placed in the surgical bed.   One gram of topical tranexamic acid was injected into the joint.  The fascia was closed with #1 vicryl plus, the deep fat layer was closed with 0 vicryl, the subcutaneous layers closed with 2.0 Vicryl Plus and the skin closed with 2.0 nylon and dermabond. A sterile dressing was applied. The patient was awakened in the operating room and taken to recovery in stable condition.  All sponge, needle, and instrument counts were correct at the end of the case.   [name], my PA, was a medical necessity for opening, closing, limb positioning, retracting, exposing, and overall facilitation and timely completion of the surgery.  Position: supine  Complications: see description of procedure.  Time Out: performed   Drains/Packing: none  Estimated blood loss: see anesthesia record  Returned to Recovery Room: in good  condition.   Antibiotics: yes   Mechanical VTE (DVT) Prophylaxis: sequential compression devices, TED thigh-high  Chemical VTE (DVT) Prophylaxis: xarelto   Fluid Replacement: see anesthesia record  Specimens Removed: 1 to pathology   Sponge and Instrument Count Correct? yes   PACU: portable radiograph - low AP   Plan/RTC: Return in 2 weeks for staple removal. Weight Bearing/Load Lower Extremity: full  Hip precautions: none Suture Removal: 2 weeks   [name], MD [facility] 11:57 AM   Implant Name Type Inv. Item Serial No. Manufacturer Lot No. LRB No. Used Action  PINN ALTRX NEUT ID X OD [id number] - [id number]  PINN ALTRX NEUT ID X OD [id number]  [employer] [id number] Left 1 Implanted  PINNSECTOR W/GRIP ACE CUP 48MM - [id number] Joint PINNSECTOR W/GRIP ACE CUP 48MM  [employer] [id number] Left 1 Implanted  SCREW 6.5MMX25MM - [id number] Screw SCREW 6.5MMX25MM  [employer] [id number] Left 1 Implanted  STEM FEM SZ3 STD ACTIS - [id number] Stem STEM FEM SZ3 STD ACTIS  [employer] [id number] Left 1 Implanted  HEAD FEM STD 32X+5 STRL - [id number] Hips HEAD FEM STD 32X+5 STRL  [employer] [id number] Left 1 Implanted

## 2022-04-15 NOTE — Anesthesia Preprocedure Evaluation (Addendum)
Anesthesia Evaluation  Patient identified by MRN, date of birth, ID band Patient awake    Reviewed: Allergy & Precautions, NPO status , Patient's Chart, lab work & pertinent test results  Airway Mallampati: III  TM Distance: >3 FB Neck ROM: Full    Dental  (+) Edentulous Upper, Partial Lower   Pulmonary neg pulmonary ROS   Pulmonary exam normal breath sounds clear to auscultation       Cardiovascular hypertension, Pt. on medications Normal cardiovascular exam Rhythm:Regular Rate:Normal     Neuro/Psych  Headaches  negative psych ROS   GI/Hepatic Neg liver ROS,GERD  ,,  Endo/Other  negative endocrine ROS    Renal/GU negative Renal ROS  negative genitourinary   Musculoskeletal  (+) Arthritis ,    Abdominal   Peds  Hematology negative hematology ROS (+)   Anesthesia Other Findings   Reproductive/Obstetrics                             Anesthesia Physical Anesthesia Plan  ASA: 2  Anesthesia Plan: Spinal   Post-op Pain Management: Tylenol PO (pre-op)*   Induction:   PONV Risk Score and Plan: 1 and Treatment may vary due to age or medical condition, Propofol infusion, Dexamethasone and Ondansetron  Airway Management Planned: Natural Airway  Additional Equipment:   Intra-op Plan:   Post-operative Plan:   Informed Consent: I have reviewed the patients History and Physical, chart, labs and discussed the procedure including the risks, benefits and alternatives for the proposed anesthesia with the patient or authorized representative who has indicated his/her understanding and acceptance.     Dental advisory given  Plan Discussed with: CRNA  Anesthesia Plan Comments:        Anesthesia Quick Evaluation

## 2022-04-15 NOTE — Evaluation (Signed)
Physical Therapy Evaluation Patient Details Name: [name] MRN: [id number] DOB: 08/18/1940 Today's Date: 04/15/2022  History of Present Illness  Pt is 82 year old presented [facility]  [facility] for lt THA. PMH - Arthritis, Breast CA, HTN  Clinical Impression  Pt admitted with above diagnosis and presents to PT with functional limitations due to deficits listed below (See PT problem list). Pt needs skilled PT to maximize independence and safety to allow discharge to home with husband. Pt typically very active and is motivated to return to independence.         Recommendations for follow up therapy are one component of a multi-disciplinary discharge planning process, led by the attending physician.  Recommendations may be updated based on patient status, additional functional criteria and insurance authorization.  Follow Up Recommendations Follow physician's recommendations for discharge plan and follow up therapies      Assistance Recommended at Discharge Intermittent Supervision/Assistance  Patient can return home with the following  A little help with walking and/or transfers;Assist for transportation;Help with stairs or ramp for entrance;A little help with bathing/dressing/bathroom    Equipment Recommendations None recommended by PT  Recommendations for Other Services       Functional Status Assessment Patient has had a recent decline in their functional status and demonstrates the ability to make significant improvements in function in a reasonable and predictable amount of time.     Precautions / Restrictions Restrictions Weight Bearing Restrictions: Yes LLE Weight Bearing: Weight bearing as tolerated      Mobility  Bed Mobility Overal bed mobility: Needs Assistance Bed Mobility: Supine to Sit     Supine to sit: Supervision, HOB elevated     General bed mobility comments: Incr time. No physical assistance    Transfers Overall transfer level: Needs  assistance Equipment used: Rolling walker (2 wheels) Transfers: Sit to/from Stand Sit to Stand: Min guard           General transfer comment: Assist for safety    Ambulation/Gait Ambulation/Gait assistance: Min guard Gait Distance (Feet): 30 Feet Assistive device: Rolling walker (2 wheels) Gait Pattern/deviations: Step-through pattern, Decreased stance time - left, Antalgic Gait velocity: decr Gait velocity interpretation: <1.31 ft/sec, indicative of household ambulator   General Gait Details: Assist for safety.  Stairs            Wheelchair Mobility    Modified Rankin (Stroke Patients Only)       Balance Overall balance assessment: Mild deficits observed, not formally tested                                           Pertinent Vitals/Pain Pain Assessment Pain Assessment: 0-10 Pain Score: 8  Pain Location: lt hip Pain Descriptors / Indicators: Throbbing Pain Intervention(s): Limited activity within patient's tolerance, Monitored during session, Premedicated before session, Repositioned    Home Living Family/patient expects to be discharged to:: Private residence Living Arrangements: Spouse/significant other Available Help at Discharge: Family;Available 24 hours/day Type of Home: House Home Access: Stairs to enter   [employer] of Steps: 1   Home Layout: One level Home Equipment: Conservation officer, nature (2 wheels);Shower seat;Hand held shower head      Prior Function Prior Level of Function : Independent/Modified Independent;Driving             Mobility Comments: No assistive device       Hand  Dominance        Extremity/Trunk Assessment   Upper Extremity Assessment Upper Extremity Assessment: Overall WFL for tasks assessed    Lower Extremity Assessment Lower Extremity Assessment: LLE deficits/detail LLE Deficits / Details: limited by post op pain       Communication   Communication: HOH  Cognition  Arousal/Alertness: Awake/alert Behavior During Therapy: WFL for tasks assessed/performed Overall Cognitive Status: Within Functional Limits for tasks assessed                                          General Comments General comments (skin integrity, edema, etc.): SpO2 98% on RA with activity    Exercises     Assessment/Plan    PT Assessment Patient needs continued PT services  PT Problem List Decreased strength;Decreased activity tolerance;Decreased mobility;Pain       PT Treatment Interventions DME instruction;Gait training;Stair training;Functional mobility training;Therapeutic activities;Therapeutic exercise;Patient/family education    PT Goals (Current goals can be found in the Care Plan section)  Acute Rehab PT Goals Patient Stated Goal: return home PT Goal Formulation: With patient Time For Goal Achievement: 04/22/22 Potential to Achieve Goals: Good    Frequency 7X/week     Co-evaluation               AM-PAC PT "6 Clicks" Mobility  Outcome Measure Help needed turning from your back to your side while in a flat bed without using bedrails?: A Little Help needed moving from lying on your back to sitting on the side of a flat bed without using bedrails?: A Little Help needed moving to and from a bed to a chair (including a wheelchair)?: A Little Help needed standing up from a chair using your arms (e.g., wheelchair or bedside chair)?: A Little Help needed to walk in hospital room?: A Little Help needed climbing 3-5 steps with a railing? : A Little 6 Click Score: 18    End of Session Equipment Utilized During Treatment: Gait belt Activity Tolerance: Patient tolerated treatment well Patient left: in chair;with call bell/phone within reach;with chair alarm set;with family/visitor present Nurse Communication: Mobility status;Other (comment) (Left O2 off) PT Visit Diagnosis: Other abnormalities of gait and mobility (R26.89);Pain Pain -  Right/Left: Left Pain - part of body: Hip    Time: [id number] PT Time Calculation (min) (ACUTE ONLY): 33 min   Charges:   PT Evaluation $PT Eval Low Complexity: 1 Low PT Treatments $Gait Training: 8-22 mins        [place] 04/15/2022, 5:55 PM

## 2022-04-15 NOTE — Anesthesia Procedure Notes (Signed)
Spinal  Patient location during procedure: OR Start time: 04/15/2022 10:30 AM End time: 04/15/2022 10:33 AM Reason for block: surgical anesthesia Staffing Performed: anesthesiologist  Anesthesiologist: Freddrick March, MD Performed by: Freddrick March, MD Authorized by: Freddrick March, MD   Preanesthetic Checklist Completed: patient identified, IV checked, risks and benefits discussed, surgical consent, monitors and equipment checked, pre-op evaluation and timeout performed Spinal Block Patient position: sitting Prep: DuraPrep and site prepped and draped Patient monitoring: cardiac monitor, continuous pulse ox and blood pressure Approach: midline Location: L3-4 Injection technique: single-shot Needle Needle type: Pencan  Needle gauge: 24 G Needle length: 9 cm Assessment Sensory level: T6 Events: CSF return Additional Notes Functioning IV was confirmed and monitors were applied. Sterile prep and drape, including hand hygiene and sterile gloves were used. The patient was positioned and the spine was prepped. The skin was anesthetized with lidocaine.  Free flow of clear CSF was obtained prior to injecting local anesthetic into the CSF.  The spinal needle aspirated freely following injection.  The needle was carefully withdrawn.  The patient tolerated the procedure well.

## 2022-04-15 NOTE — Discharge Instructions (Signed)

## 2022-04-15 NOTE — Plan of Care (Signed)
?  Problem: Education: ?Goal: Knowledge of the prescribed therapeutic regimen will improve ?Outcome: Progressing ?  ?Problem: Activity: ?Goal: Ability to avoid complications of mobility impairment will improve ?Outcome: Progressing ?  ?Problem: Pain Management: ?Goal: Pain level will decrease with appropriate interventions ?Outcome: Progressing ?  ?Problem: Skin Integrity: ?Goal: Will show signs of wound healing ?Outcome: Progressing ?  ?

## 2022-04-15 NOTE — Transfer of Care (Signed)
Immediate Anesthesia Transfer of Care Note  Patient: [name] [place]  Procedure(s) Performed: LEFT TOTAL HIP ARTHROPLASTY ANTERIOR APPROACH (Left: Hip)  Patient Location: PACU  Anesthesia Type:Spinal  Level of Consciousness: drowsy and patient cooperative  Airway & Oxygen Therapy: Patient Spontanous Breathing and Patient connected to nasal cannula oxygen  Post-op Assessment: Report given to RN and Post -op Vital signs reviewed and stable  Post vital signs: Reviewed and stable  Last Vitals:  Vitals Value Taken Time  BP 116/87 04/15/22 1234  Temp    Pulse 74 04/15/22 1236  Resp 21 04/15/22 1236  SpO2 95 % 04/15/22 1236  Vitals shown include unvalidated device data.  Last Pain:  Vitals:   04/15/22 0855  TempSrc:   PainSc: 0-No pain         Complications: No notable events documented.

## 2022-04-15 NOTE — H&P (Signed)
PREOPERATIVE H&P  Chief Complaint: left hip osteoarthritis  HPI: [name] is a 82 y.o. female who presents for surgical treatment of left hip osteoarthritis.  She denies any changes in medical history.  Past Medical History:  Diagnosis Date   Arthritis    Breast cancer ([place]) 04/2003   Invasive ductal carcinoma of the right breast   Complication of anesthesia    "slow to wake up"   Hard of hearing    Headache    Migraines years ago   Hypertension    Osteopenia    Past Surgical History:  Procedure Laterality Date   BREAST SURGERY  02/05/2003   LUMPECTOMY   CATARACT EXTRACTION     COLONOSCOPY     EPISIOTOMY     HYSTEROSCOPY WITH D & C     LYMPH NODE BIOPSY N/A 06/13/2015   Procedure: SENTINEL  LYMPH NODE BIOPSY;  Surgeon: Janie Morning, MD;  Location: [facility];  Service: Gynecology;  Laterality: N/A;   PROCTOSCOPY     PROCTOSOMY REPAIR     ROBOTIC ASSISTED TOTAL HYSTERECTOMY N/A 06/13/2015   Procedure: XI ROBOTIC ASSISTED TOTAL ABDOMINAL LAPAROSCOPIC HYSTERECTOMY;  Surgeon: Janie Morning, MD;  Location: [facility];  Service: Gynecology;  Laterality: N/A;   SALPINGOOPHORECTOMY Bilateral 06/13/2015   Procedure: SALPINGO OOPHORECTOMY;  Surgeon: Janie Morning, MD;  Location: [facility];  Service: Gynecology;  Laterality: Bilateral;   STAPENDECTOMY     BILATERAL   Social History   Socioeconomic History   Marital status: Married    Spouse name: Not on file   Number of children: Not on file   Years of education: Not on file   Highest education level: Not on file  Occupational History   Occupation: retired    Fish farm manager: Retired  Tobacco Use   Smoking status: Never   Smokeless tobacco: Never  Scientific laboratory technician Use: Never used  Substance and Sexual Activity   Alcohol use: No   Drug use: No   Sexual activity: Yes    Birth control/protection: Post-menopausal  Other Topics Concern   Not on file  Social History Narrative   Regular exercise--no, occ goes to [employer]    Diet: healthy, fruits and veggies   No living will, no HCPOA (reviewed 2013)   Social Determinants of Health   Financial Resource Strain: Low Risk  (05/15/2021)   Overall Financial Resource Strain (CARDIA)    Difficulty of Paying Living Expenses: Not hard at all  Food Insecurity: No Food Insecurity (05/15/2021)   Hunger Vital Sign    Worried About Running Out of Food in the Last Year: Never true    [place] in the Last Year: Never true  Transportation Needs: No Transportation Needs (05/15/2021)   PRAPARE - Hydrologist (Medical): No    Lack of Transportation (Non-Medical): No  Physical Activity: Insufficiently Active (05/15/2021)   Exercise Vital Sign    Days of Exercise per Week: 2 days    Minutes of Exercise per Session: 20 min  Stress: No Stress Concern Present (05/15/2021)   [place]    Feeling of Stress : Not at all  Social Connections: Moderately Integrated (05/15/2021)   Social Connection and Isolation Panel [NHANES]    Frequency of Communication with Friends and Family: More than three times a week    Frequency of Social Gatherings with Friends and Family: Three times a week  Attends Religious Services: More than 4 times per year    Active Member of Clubs or Organizations: No    Attends Archivist Meetings: Never    Marital Status: Married   Family History  Problem Relation Age of Onset   Stroke Father    Hypertension Mother    Stroke Sister    Cancer Brother        Esophageal cancer   Cancer Brother        Lung smoker   Parkinson's disease Brother    Stroke Brother    Allergies  Allergen Reactions   Morphine And Related     Patient requested not to have morphine. States it is too strong for her.    Penicillins Rash    Has patient had a PCN reaction causing immediate rash, facial/tongue/throat swelling, SOB or lightheadedness with hypotension: Yes Has  patient had a PCN reaction causing severe rash involving mucus membranes or skin necrosis: Unknown Has patient had a PCN reaction that required hospitalization No Has patient had a PCN reaction occurring within the last 10 years: No If all of the above answers are "NO", then may proceed with Cephalosporin use.   Prior to Admission medications   Medication Sig Start Date End Date Taking? Authorizing Provider  Cholecalciferol 1000 UNITS capsule Take 1,000 Units by mouth 2 (two) times daily.   Yes [provider]  Cyanocobalamin (B-12 PO) Take 1 tablet by mouth daily.    Yes [provider]  diclofenac Sodium (VOLTAREN) 1 % GEL Apply 2 g topically daily as needed (pain).   Yes [provider]  Flaxseed, Linseed, (FLAX SEED OIL) 1000 MG CAPS Take 1,000 mg by mouth in the morning and at bedtime.   Yes [provider]  fluticasone (FLONASE) 50 MCG/ACT nasal spray Place 2 sprays into both nostrils daily. Patient taking differently: Place 2 sprays into both nostrils daily as needed for allergies. 05/23/20  Yes Bedsole, Amy E, MD  losartan-hydrochlorothiazide (HYZAAR) 50-12.5 MG tablet TAKE 1 TABLET BY MOUTH DAILY 11/13/21  Yes Bedsole, Amy E, MD  sodium fluoride-calcium carbonate (FLORICAL) 8.3-364 MG CAPS Take 1 capsule by mouth 2 (two) times daily.   Yes [provider]  docusate sodium (COLACE) 100 MG capsule Take 1 capsule (100 mg total) by mouth daily as needed. 04/05/22 04/05/23  Aundra Dubin, PA-C  methocarbamol (ROBAXIN-750) 750 MG tablet Take 1 tablet (750 mg total) by mouth 2 (two) times daily as needed for muscle spasms. 04/05/22   Aundra Dubin, PA-C  ondansetron (ZOFRAN) 4 MG tablet Take 1 tablet (4 mg total) by mouth every 8 (eight) hours as needed for nausea or vomiting. 04/05/22   Aundra Dubin, PA-C  oxyCODONE-acetaminophen (PERCOCET) 5-325 MG tablet Take 1-2 tablets by mouth every 6 (six) hours as needed. To be taken after surgery 04/08/22    Aundra Dubin, PA-C  rivaroxaban (XARELTO) 10 MG TABS tablet Take 1 tablet (10 mg total) by mouth daily. To be taken after surgery to prevent blood clots 04/05/22   Aundra Dubin, PA-C     Positive ROS: All other systems have been reviewed and were otherwise negative with the exception of those mentioned in the HPI and as above.  Physical Exam: General: Alert, no acute distress Cardiovascular: No pedal edema Respiratory: No cyanosis, no use of accessory musculature GI: abdomen soft Skin: No lesions in the area of chief complaint Neurologic: Sensation intact distally Psychiatric: Patient is competent for consent with  normal mood and affect Lymphatic: no lymphedema  MUSCULOSKELETAL: exam stable  Assessment: left hip osteoarthritis  Plan: Plan for Procedure(s): LEFT TOTAL HIP ARTHROPLASTY ANTERIOR APPROACH  The risks benefits and alternatives were discussed with the patient including but not limited to the risks of nonoperative treatment, versus surgical intervention including infection, bleeding, nerve injury,  blood clots, cardiopulmonary complications, morbidity, mortality, among others, and they were willing to proceed.   Eduard Roux, MD 04/15/2022 8:53 AM

## 2022-04-16 ENCOUNTER — Encounter (HOSPITAL_COMMUNITY): Payer: Self-pay | Admitting: Orthopaedic Surgery

## 2022-04-16 DIAGNOSIS — M1612 Unilateral primary osteoarthritis, left hip: Secondary | ICD-10-CM | POA: Diagnosis not present

## 2022-04-16 LAB — CBC
HCT: 36.6 % (ref 36.0–46.0)
Hemoglobin: 12.1 g/dL (ref 12.0–15.0)
MCH: 28.6 pg (ref 26.0–34.0)
MCHC: 33.1 g/dL (ref 30.0–36.0)
MCV: 86.5 fL (ref 80.0–100.0)
Platelets: 176 10*3/uL (ref 150–400)
RBC: 4.23 MIL/uL (ref 3.87–5.11)
RDW: 12.4 % (ref 11.5–15.5)
WBC: 13.3 10*3/uL — ABNORMAL HIGH (ref 4.0–10.5)
nRBC: 0 % (ref 0.0–0.2)

## 2022-04-16 NOTE — TOC Transition Note (Addendum)
Transition of Care [facility]) - CM/SW Discharge Note   Patient Details  Name: [name] MRN: [id number] Date of Birth: [birth year]  [facility] CM/SW Contact:  Carles Collet, RN Phone Number: 04/16/2022, 11:32 AM   Clinical Narrative:    Damaris Schooner w patient and spouse at bedside.  They have shower seat and RW at home. Declined 3/1.  No preference for [facility] provider.  Referral pending with Annamaria Boots accepted with start of care on Thursday   Final next level of care: [place] Barriers to Discharge: No Barriers Identified   Patient Goals and CMS Choice CMS Medicare.gov Compare Post Acute Care list provided to:: Patient Choice offered to / list presented to : Patient  Discharge Placement                         Discharge Plan and Services Additional resources added to the After Visit Summary for                            [facility] Arranged: PT HH Agency: [place] Date Gate: 04/16/22 Time [place]: 1132 Representative spoke with at [place]: Angie  Social Determinants of Health ([place]) Interventions SDOH Screenings   Food Insecurity: No Food Insecurity (05/15/2021)  Housing: Low Risk  (05/15/2021)  Transportation Needs: No Transportation Needs (05/15/2021)  Alcohol Screen: Low Risk  (05/15/2021)  Depression (PHQ2-9): Low Risk  (05/15/2021)  Financial Resource Strain: Low Risk  (05/15/2021)  Physical Activity: Insufficiently Active (05/15/2021)  Social Connections: Moderately Integrated (05/15/2021)  Stress: No Stress Concern Present (05/15/2021)  Tobacco Use: Low Risk  (04/16/2022)     Readmission Risk Interventions     No data to display

## 2022-04-16 NOTE — Progress Notes (Addendum)
Subjective: 1 Day Post-Op Procedure(s) (LRB): LEFT TOTAL HIP ARTHROPLASTY ANTERIOR APPROACH (Left) Patient reports pain as mild.  Experiencing some nausea this am.    Objective: Vital signs in last 24 hours: Temp:  [97.4 F (36.3 C)-97.9 F (36.6 C)] 97.5 F (36.4 C) (03/12 0758) Pulse Rate:  [58-81] 76 (03/12 0758) Resp:  [11-19] 18 (03/12 0442) BP: (116-165)/(66-114) 124/76 (03/12 0758) SpO2:  [92 %-100 %] 97 % (03/12 0758)  Intake/Output from previous day: 03/11 0701 - 03/12 0700 In: 1740 [P.O.:240; I.V.:1300; IV Piggyback:200] Out: 150 [Blood:150] Intake/Output this shift: No intake/output data recorded.  Recent Labs    04/16/22 0203  HGB 12.1   Recent Labs    04/16/22 0203  WBC 13.3*  RBC 4.23  HCT 36.6  PLT 176   No results for input(s): "NA", "K", "CL", "CO2", "BUN", "CREATININE", "GLUCOSE", "CALCIUM" in the last 72 hours. No results for input(s): "LABPT", "INR" in the last 72 hours.  Neurologically intact Neurovascular intact Sensation intact distally Intact pulses distally Dorsiflexion/Plantar flexion intact Incision: dressing C/D/I No cellulitis present Compartment soft   Assessment/Plan: 1 Day Post-Op Procedure(s) (LRB): LEFT TOTAL HIP ARTHROPLASTY ANTERIOR APPROACH (Left) Advance diet Up with therapy D/C IV fluids Discharge home with home health later today vs tomorrow as long as the nausea improves, she has voided and is cleared by PT WBAT LLE       [name] 04/16/2022, 10:02 AM

## 2022-04-16 NOTE — Progress Notes (Signed)
Physical Therapy Treatment Patient Details Name: [name] MRN: [id number] DOB: 04-May-1940 Today's Date: 04/16/2022   History of Present Illness Pt is 82 year old presented to [facility] on  04/15/22 for lt THA. PMH - Arthritis, Breast CA, HTN    PT Comments    Patient not progressing towards PT goals this morning secondary to nausea with movement. Requires Min guard-Min A for standing from different surfaces. Tolerated short distance ambulation in room with use of RW and Min guard assist. Pt became nauseous with mobility, so provided emesis bag. RN present and aware. Instructed pt in there ex in chair. Will provide HEP handout next session and work on progressing ambulation in PM when pt feeling better. Will follow acutely.    Recommendations for follow up therapy are one component of a multi-disciplinary discharge planning process, led by the attending physician.  Recommendations may be updated based on patient status, additional functional criteria and insurance authorization.  Follow Up Recommendations  Follow physician's recommendations for discharge plan and follow up therapies     Assistance Recommended at Discharge Intermittent Supervision/Assistance  Patient can return home with the following A little help with walking and/or transfers;Assist for transportation;Help with stairs or ramp for entrance;A little help with bathing/dressing/bathroom   Equipment Recommendations  None recommended by PT    Recommendations for Other Services       Precautions / Restrictions Precautions Precautions: Fall Restrictions Weight Bearing Restrictions: Yes LLE Weight Bearing: Weight bearing as tolerated     Mobility  Bed Mobility               General bed mobility comments: Up in chair upon PT arrival. SLept in recliner last night.    Transfers Overall transfer level: Needs assistance Equipment used: Rolling walker (2 wheels) Transfers: Sit to/from Stand Sit to Stand: Min  guard, Min assist           General transfer comment: Stood from chair x2 with Min guard assist with armr ests and Min A without arm rests. Nausea limiting mobility this session.    Ambulation/Gait Ambulation/Gait assistance: Min guard Gait Distance (Feet): 16 Feet Assistive device: Rolling walker (2 wheels) Gait Pattern/deviations: Step-through pattern, Decreased stance time - left, Antalgic Gait velocity: decr Gait velocity interpretation: <1.31 ft/sec, indicative of household ambulator   General Gait Details: Slow, antalgic like gait but limited due to nausea.   Stairs             Wheelchair Mobility    Modified Rankin (Stroke Patients Only)       Balance Overall balance assessment: Needs assistance Sitting-balance support: Feet supported, No upper extremity supported Sitting balance-Leahy Scale: Good     Standing balance support: During functional activity, Bilateral upper extremity supported Standing balance-Leahy Scale: Poor Standing balance comment: UE support needed                            Cognition Arousal/Alertness: Awake/alert Behavior During Therapy: WFL for tasks assessed/performed Overall Cognitive Status: Within Functional Limits for tasks assessed                                          Exercises Total Joint Exercises Ankle Circles/Pumps: AROM, Both, 10 reps, Seated Quad Sets: AROM, Both, 10 reps, Seated Other Exercises Other Exercises: heel raises and toe raises sitting inc hair x10  bilaterally    General Comments General comments (skin integrity, edema, etc.): Buddy prsent during session.      Pertinent Vitals/Pain Pain Assessment Pain Assessment: 0-10 Pain Score: 6  Pain Location: lt hip Pain Descriptors / Indicators: Throbbing, Sore, Operative site guarding Pain Intervention(s): Monitored during session, Repositioned, Patient requesting pain meds-RN notified, Limited activity within patient's  tolerance    Home Living                          Prior Function            PT Goals (current goals can now be found in the care plan section) Progress towards PT goals: Not progressing toward goals - comment (due to nausea)    Frequency    7X/week      PT Plan Current plan remains appropriate    Co-evaluation              AM-PAC PT "6 Clicks" Mobility   Outcome Measure  Help needed turning from your back to your side while in a flat bed without using bedrails?: A Little Help needed moving from lying on your back to sitting on the side of a flat bed without using bedrails?: A Little Help needed moving to and from a bed to a chair (including a wheelchair)?: A Little Help needed standing up from a chair using your arms (e.g., wheelchair or bedside chair)?: A Little Help needed to walk in hospital room?: A Little Help needed climbing 3-5 steps with a railing? : A Little 6 Click Score: 18    End of Session Equipment Utilized During Treatment: Gait belt Activity Tolerance: Other (comment) (nausea) Patient left: in chair;with call bell/phone within reach;with chair alarm set;with family/visitor present;with nursing/sitter in room Nurse Communication: Mobility status PT Visit Diagnosis: Other abnormalities of gait and mobility (R26.89);Pain Pain - Right/Left: Left Pain - part of body: Hip     Time: [id number] PT Time Calculation (min) (ACUTE ONLY): 23 min  Charges:  $Therapeutic Exercise: 8-22 mins $Therapeutic Activity: 8-22 mins                     Marisa Severin, PT, DPT Acute Rehabilitation Services Secure chat preferred Office [phone]      [name] 04/16/2022, 8:45 AM

## 2022-04-16 NOTE — Progress Notes (Signed)
Physical Therapy Treatment Patient Details Name: [name] MRN: [id number] DOB: [birth year] Today's Date: 04/16/2022   History of Present Illness Pt is 82 year old presented to [facility] on  04/15/22 for lt THA. PMH - Arthritis, Breast CA, HTN    PT Comments    Patient progressing well this afternoon. Reports nausea has improved. Requires Min guard assist-supervision for transfers and ambulation. Improved ambulation distance this session without difficulty. Provided HEP handout and reviewed. Pt reports feeling tired and eager for a nap. Pt safe to d/c home from a mobility stand point with family support. Will follow if still in the hospital.    Recommendations for follow up therapy are one component of a multi-disciplinary discharge planning process, led by the attending physician.  Recommendations may be updated based on patient status, additional functional criteria and insurance authorization.  Follow Up Recommendations  Follow physician's recommendations for discharge plan and follow up therapies     Assistance Recommended at Discharge Intermittent Supervision/Assistance  Patient can return home with the following A little help with walking and/or transfers;Assist for transportation;Help with stairs or ramp for entrance;A little help with bathing/dressing/bathroom   Equipment Recommendations  None recommended by PT    Recommendations for Other Services       Precautions / Restrictions Precautions Precautions: Fall Restrictions Weight Bearing Restrictions: Yes LLE Weight Bearing: Weight bearing as tolerated     Mobility  Bed Mobility Overal bed mobility: Needs Assistance Bed Mobility: Sit to Supine       Sit to supine: Supervision   General bed mobility comments: No assist needed, able to bring LLE into bed without support.    Transfers Overall transfer level: Needs assistance Equipment used: Rolling walker (2 wheels) Transfers: Sit to/from Stand Sit to Stand: Min  guard           General transfer comment: Min guard for safety. Stood from Youth worker.    Ambulation/Gait Ambulation/Gait assistance: Supervision Gait Distance (Feet): 250 Feet Assistive device: Rolling walker (2 wheels) Gait Pattern/deviations: Step-through pattern, Decreased stance time - left, Antalgic Gait velocity: decr Gait velocity interpretation: <1.31 ft/sec, indicative of household ambulator   General Gait Details: Slow, antalgic like gait with use of RW, no evidence of imbalane or nausea.   Stairs             Wheelchair Mobility    Modified Rankin (Stroke Patients Only)       Balance Overall balance assessment: Needs assistance Sitting-balance support: Feet supported, No upper extremity supported Sitting balance-Leahy Scale: Good     Standing balance support: During functional activity Standing balance-Leahy Scale: Poor Standing balance comment: UE support needed                            Cognition Arousal/Alertness: Awake/alert Behavior During Therapy: WFL for tasks assessed/performed Overall Cognitive Status: Within Functional Limits for tasks assessed                                          Exercises Total Joint Exercises Ankle Circles/Pumps: AROM, Both, 10 reps, Seated Quad Sets: AROM, Both, 10 reps, Seated Other Exercises Other Exercises: heel raises and toe raises sitting inc hair x10 bilaterally    General Comments General comments (skin integrity, edema, etc.): Provided HEP handout and reviewed.      Pertinent Vitals/Pain Pain Assessment  Pain Assessment: 0-10 Pain Score: 7  Pain Location: lt hip Pain Descriptors / Indicators: Throbbing, Sore, Operative site guarding Pain Intervention(s): Monitored during session, Repositioned    Home Living                          Prior Function            PT Goals (current goals can now be found in the care plan section) Progress towards PT  goals: Progressing toward goals    Frequency    7X/week      PT Plan Current plan remains appropriate    Co-evaluation              AM-PAC PT "6 Clicks" Mobility   Outcome Measure  Help needed turning from your back to your side while in a flat bed without using bedrails?: A Little Help needed moving from lying on your back to sitting on the side of a flat bed without using bedrails?: A Little Help needed moving to and from a bed to a chair (including a wheelchair)?: A Little Help needed standing up from a chair using your arms (e.g., wheelchair or bedside chair)?: A Little Help needed to walk in hospital room?: A Little Help needed climbing 3-5 steps with a railing? : A Little 6 Click Score: 18    End of Session Equipment Utilized During Treatment: Gait belt Activity Tolerance: Patient tolerated treatment well Patient left: in bed;with call bell/phone within reach;with bed alarm set Nurse Communication: Mobility status PT Visit Diagnosis: Other abnormalities of gait and mobility (R26.89);Pain Pain - Right/Left: Left Pain - part of body: Hip     Time: [id number] PT Time Calculation (min) (ACUTE ONLY): 17 min  Charges:  $Gait Training: 8-22 mins                     Marisa Severin, PT, DPT Acute Rehabilitation Services Secure chat preferred Office [place] 04/16/2022, 1:00 PM

## 2022-04-16 NOTE — Discharge Summary (Signed)
Patient ID: [name] MRN: [id number] DOB/AGE: Feb 21, 1940 82 y.o.  Admit date: 04/15/2022 Discharge date: 04/16/2022  Admission Diagnoses:  Principal Problem:   Primary osteoarthritis of left hip Active Problems:   Status post total replacement of left hip   Discharge Diagnoses:  Same  Past Medical History:  Diagnosis Date   Arthritis    Breast cancer ([place]) 04/2003   Invasive ductal carcinoma of the right breast   Complication of anesthesia    "slow to wake up"   Hard of hearing    Headache    Migraines years ago   Hypertension    Osteopenia     Surgeries: Procedure(s): LEFT TOTAL HIP ARTHROPLASTY ANTERIOR APPROACH on 04/15/2022   Consultants:   Discharged Condition: Improved  Hospital Course: [name] is an 82 y.o. female who was admitted 04/15/2022 for operative treatment ofPrimary osteoarthritis of left hip. Patient has severe unremitting pain that affects sleep, daily activities, and work/hobbies. After pre-op clearance the patient was taken to the operating room on 04/15/2022 and underwent  Procedure(s): LEFT TOTAL HIP ARTHROPLASTY ANTERIOR APPROACH.    Patient was given perioperative antibiotics:  Anti-infectives (From admission, onward)    Start     Dose/Rate Route Frequency Ordered Stop   04/15/22 2200  ceFAZolin (ANCEF) IVPB 2g/100 mL premix        2 g 200 mL/hr over 30 Minutes Intravenous Every 12 hours 04/15/22 1517 04/15/22 2308   04/15/22 1107  vancomycin (VANCOCIN) powder  Status:  Discontinued          As needed 04/15/22 1107 04/15/22 1232   04/15/22 0830  ceFAZolin (ANCEF) IVPB 2g/100 mL premix        2 g 200 mL/hr over 30 Minutes Intravenous On call to O.R. 04/15/22 0816 04/15/22 1058        Patient was given sequential compression devices, early ambulation, and chemoprophylaxis to prevent DVT.  Patient benefited maximally from hospital stay and there were no complications.    Recent vital signs: Patient Vitals for the past 24 hrs:   BP Temp Temp src Pulse Resp SpO2  04/16/22 0758 124/76 (!) 97.5 F (36.4 C) Oral 76 -- 97 %  04/16/22 0442 123/67 -- -- 78 18 93 %  04/16/22 0030 125/73 -- -- 79 18 96 %  04/15/22 2031 (!) 143/74 97.9 F (36.6 C) Oral 80 18 92 %  04/15/22 1652 (!) 165/80 -- -- -- -- --  04/15/22 1511 (!) 125/114 (!) 97.5 F (36.4 C) Oral 65 -- 100 %  04/15/22 1457 (!) 140/101 (!) 97.4 F (36.3 C) -- 69 15 98 %  04/15/22 1445 134/66 -- -- 66 14 98 %  04/15/22 1430 (!) 157/83 -- -- 61 12 93 %  04/15/22 1415 (!) 138/97 -- -- 64 18 97 %  04/15/22 1400 (!) 154/79 -- -- (!) 59 11 94 %  04/15/22 1345 (!) 162/88 -- -- 81 19 98 %  04/15/22 1330 134/76 -- -- (!) 58 12 98 %  04/15/22 1315 126/72 -- -- 60 14 96 %  04/15/22 1300 (!) 142/82 -- -- 63 15 98 %  04/15/22 1245 132/75 -- -- 70 16 96 %  04/15/22 1234 116/87 (!) 97.4 F (36.3 C) -- 77 15 92 %     Recent laboratory studies:  Recent Labs    04/16/22 0203  WBC 13.3*  HGB 12.1  HCT 36.6  PLT 176     Discharge Medications:   Allergies as of 04/16/2022  Reactions   Morphine And Related    Patient requested not to have morphine. States it is too strong for her.    Penicillins Rash   Has patient had a PCN reaction causing immediate rash, facial/tongue/throat swelling, SOB or lightheadedness with hypotension: Yes Has patient had a PCN reaction causing severe rash involving mucus membranes or skin necrosis: Unknown Has patient had a PCN reaction that required hospitalization No Has patient had a PCN reaction occurring within the last 10 years: No If all of the above answers are "NO", then may proceed with Cephalosporin use.        Medication List     STOP taking these medications    Flax Seed Oil 1000 MG Caps       TAKE these medications    B-12 PO Take 1 tablet by mouth daily.   Cholecalciferol 25 MCG (1000 UT) capsule Take 1,000 Units by mouth 2 (two) times daily.   docusate sodium 100 MG capsule Commonly known as:  Colace Take 1 capsule (100 mg total) by mouth daily as needed.   fluticasone 50 MCG/ACT nasal spray Commonly known as: FLONASE Place 2 sprays into both nostrils daily. What changed:  when to take this reasons to take this   losartan-hydrochlorothiazide 50-12.5 MG tablet Commonly known as: HYZAAR TAKE 1 TABLET BY MOUTH DAILY   methocarbamol 750 MG tablet Commonly known as: Robaxin-750 Take 1 tablet (750 mg total) by mouth 2 (two) times daily as needed for muscle spasms.   ondansetron 4 MG tablet Commonly known as: Zofran Take 1 tablet (4 mg total) by mouth every 8 (eight) hours as needed for nausea or vomiting.   oxyCODONE-acetaminophen 5-325 MG tablet Commonly known as: Percocet Take 1-2 tablets by mouth every 6 (six) hours as needed. To be taken after surgery   rivaroxaban 10 MG Tabs tablet Commonly known as: XARELTO Take 1 tablet (10 mg total) by mouth daily. To be taken after surgery to prevent blood clots   sodium fluoride-calcium carbonate 8.3-364 MG Caps capsule Commonly known as: FLORICAL Take 1 capsule by mouth 2 (two) times daily.   Voltaren 1 % Gel Generic drug: diclofenac Sodium Apply 2 g topically daily as needed (pain).               Durable Medical Equipment  (From admission, onward)           Start     Ordered   04/15/22 1518  DME Walker rolling  Once       Question:  Patient needs a walker to treat with the following condition  Answer:  History of hip replacement   04/15/22 1517   04/15/22 1518  DME 3 n 1  Once        04/15/22 1517   04/15/22 1518  DME Bedside commode  Once       Question:  Patient needs a bedside commode to treat with the following condition  Answer:  History of hip replacement   04/15/22 1517            Diagnostic Studies: DG Pelvis Portable  Result Date: 04/15/2022 CLINICAL DATA:  Post left hip arthroplasty anterior approach EXAM: PORTABLE PELVIS 1-2 VIEWS COMPARISON:  04/05/2021 FINDINGS: Components of left  hip arthroplasty project in expected location. No fracture or dislocation. Bony pelvis intact. Patchy iliac arterial calcifications. IMPRESSION: Left hip arthroplasty without apparent complication. Electronically Signed   By: Lucrezia Europe M.D.   On: 04/15/2022 14:33   DG HIP  UNILAT WITH PELVIS 1V LEFT  Result Date: 04/15/2022 CLINICAL DATA:  [id number] Elective surgery [id number] EXAM: DG HIP (WITH OR WITHOUT PELVIS) 1V*L* COMPARISON:  None Available. FINDINGS: Intraoperative images during left total hip arthroplasty. Normal alignment. No evidence of immediate complication. Expected soft tissue changes. IMPRESSION: Intraoperative images during left total hip arthroplasty. Normal alignment. No evidence of immediate complication. Electronically Signed   By: Maurine Simmering M.D.   On: 04/15/2022 13:18   DG C-Arm 1-60 Min-No Report  Result Date: 04/15/2022 Fluoroscopy was utilized by the requesting physician.  No radiographic interpretation.   DG C-Arm 1-60 Min-No Report  Result Date: 04/15/2022 Fluoroscopy was utilized by the requesting physician.  No radiographic interpretation.    Disposition: Discharge disposition: 01-Home or Self Care          Follow-up Information     Leandrew Koyanagi, MD. Schedule an appointment as soon as possible for a visit in 2 week(s).   Specialty: Orthopedic Surgery Contact information: [address] [place] [address] [phone]                  Signed: Aundra Dubin 04/16/2022, 10:06 AM

## 2022-04-17 ENCOUNTER — Telehealth: Payer: Self-pay

## 2022-04-17 NOTE — Transitions of Care (Post Inpatient/ED Visit) (Signed)
   04/17/2022  Name: [name] MRN: [id number] DOB: 1940/09/25  Today's TOC FU Call Status: Today's TOC FU Call Status:: Successful TOC FU Call Competed TOC FU Call Complete Date: 04/17/22  Transition Care Management Follow-up Telephone Call Date of Discharge: 04/16/22 Discharge Facility: [facility]) Type of Discharge: Inpatient Admission Primary Inpatient Discharge Diagnosis:: Primary osteoarthritis of left hip How have you been since you were released from the hospital?: Better Any questions or concerns?: No  Items Reviewed: Did you receive and understand the discharge instructions provided?: No Medications obtained and verified?: Yes (Medications Reviewed) Any new allergies since your discharge?: No Dietary orders reviewed?: Yes Do you have support at home?: Yes  Home Care and Equipment/Supplies: [place] Ordered?: Yes Name of [place]:: unsure of name Has Agency set up a time to come to your home?: No Any new equipment or medical supplies ordered?: Yes Name of Medical supply agency?: pt had RW and BSC at home already Were you able to get the equipment/medical supplies?: Yes Do you have any questions related to the use of the equipment/supplies?: No  Functional Questionnaire: Do you need assistance with bathing/showering or dressing?: Yes Do you need assistance with meal preparation?: Yes Do you need assistance with eating?: No Do you have difficulty maintaining continence: No Do you need assistance with getting out of bed/getting out of a chair/moving?: Yes Do you have difficulty managing or taking your medications?: No  Folllow up appointments reviewed: PCP Follow-up appointment confirmed?: No (specialist) MD Provider Line Number:[phone] Given: Yes [facility] Follow-up appointment confirmed?: Yes Date of Specialist follow-up appointment?: 04/26/22 Follow-Up Specialty Provider:: Dr Erlinda Hong Do you need transportation to your  follow-up appointment?: No Do you understand care options if your condition(s) worsen?: Yes-patient verbalized understanding    [place] LPN [place] Direct Dial [phone]

## 2022-04-18 ENCOUNTER — Telehealth: Payer: Self-pay

## 2022-04-18 NOTE — Telephone Encounter (Signed)
Called and gave verbal

## 2022-04-18 NOTE — Telephone Encounter (Signed)
[name] with [facility] called to let us know the patient has been admitted today for home health PT post THA. He is requesting verbal orders for 2 week 2, then 1 week 2. His number is [phone].

## 2022-04-26 ENCOUNTER — Ambulatory Visit (INDEPENDENT_AMBULATORY_CARE_PROVIDER_SITE_OTHER): Payer: Medicare Other | Admitting: Orthopaedic Surgery

## 2022-04-26 ENCOUNTER — Encounter: Payer: Self-pay | Admitting: Orthopaedic Surgery

## 2022-04-26 DIAGNOSIS — Z96642 Presence of left artificial hip joint: Secondary | ICD-10-CM

## 2022-04-26 MED ORDER — OXYCODONE-ACETAMINOPHEN 5-325 MG PO TABS: 1.0000 | ORAL_TABLET | Freq: Every day | ORAL | 0 refills | Status: DC | PRN

## 2022-04-26 NOTE — Progress Notes (Signed)
Post-Op Visit Note   Patient: [name]           Date of Birth: 05-06-1940           MRN: [id number] Visit Date: 04/26/2022 PCP: Jinny Sanders, MD   Assessment & Plan:  Chief Complaint:  Chief Complaint  Patient presents with   Left Hip - Follow-up    Left total hip arthroplasty 04/15/2022   Visit Diagnoses:  1. Status post total replacement of left hip     Plan: Arda is 11 days status post left total hip replacement.  She is doing well overall.  She is getting home health PT twice a week.  Examination of left hip shows healed bikini incision.  No signs of infection or drainage.  Equal leg lengths.  Ambulates with a rolling walker.  Sutures removed Steri-Strips applied.  Tegaderm with 4 x 4 applied to the incision.  Instructions reviewed.  Handicap placard implant card provided.  Continue home health PT.  Percocet refilled for nighttime pain.  Recheck in 4 weeks with standing AP pelvis x-rays.  Follow-Up Instructions: Return in about 4 weeks (around 05/24/2022).   Orders:  No orders of the defined types were placed in this encounter.  Meds ordered this encounter  Medications   oxyCODONE-acetaminophen (PERCOCET) 5-325 MG tablet    Sig: Take 1-2 tablets by mouth daily as needed. To be taken after surgery    Dispense:  20 tablet    Refill:  0    Imaging: No results found.  PMFS History: Patient Active Problem List   Diagnosis Date Noted   Status post total replacement of left hip 04/15/2022   Pre-op examination 03/19/2022   COVID-19 virus infection 12/03/2021   Bilateral sensorineural hearing loss 11/08/2021   Dysfunction of right eustachian tube 10/24/2021   Primary osteoarthritis of left hip 04/17/2021   Left hip pain 04/05/2021   Strain of left groin 02/08/2021   Acute pain of right shoulder 02/08/2021   Neck pain 02/08/2021   Gastroesophageal reflux disease 07/01/2020   Decreased GFR 03/20/2018   Arthritis of carpometacarpal (CMC) joint of left thumb  03/08/2016   Trochanteric bursitis of left hip 03/08/2016   Medicare annual wellness visit, subsequent 03/04/2016   History of endometrial cancer    Lumbar back pain with radiculopathy affecting right lower extremity 03/03/2015   Counseling regarding end of life decision making 12/23/2013   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   Allergic rhinitis 01/14/2012   Vaginal laceration, old 02/01/2011   External hemorrhoids without complication [id number]   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   Hx of malignant neoplasm of female breast 09/09/2007   HYPERCHOLESTEROLEMIA 02/24/2007   Essential hypertension, benign 10/10/2006   HEMORRHOIDS, EXTERNAL W/O COMPLICATION 0000000   Osteopenia 10/10/2006   HX, PERSONAL, MALIGNANCY, BREAST 10/10/2006   Past Medical History:  Diagnosis Date   Arthritis    Breast cancer ([place]) 04/2003   Invasive ductal carcinoma of the right breast   Complication of anesthesia    "slow to wake up"   Hard of hearing    Headache    Migraines years ago   Hypertension    Osteopenia     Family History  Problem Relation Age of Onset   Stroke Father    Hypertension Mother    Stroke Sister    Cancer Brother        Esophageal cancer   Cancer Brother        Lung smoker   Parkinson's disease  Brother    Stroke Brother     Past Surgical History:  Procedure Laterality Date   BREAST SURGERY  02/05/2003   LUMPECTOMY   CATARACT EXTRACTION     COLONOSCOPY     EPISIOTOMY     HYSTEROSCOPY WITH D & C     LYMPH NODE BIOPSY N/A 06/13/2015   Procedure: SENTINEL  LYMPH NODE BIOPSY;  Surgeon: Janie Morning, MD;  Location: [facility];  Service: Gynecology;  Laterality: N/A;   PROCTOSCOPY     PROCTOSOMY REPAIR     ROBOTIC ASSISTED TOTAL HYSTERECTOMY N/A 06/13/2015   Procedure: XI ROBOTIC ASSISTED TOTAL ABDOMINAL LAPAROSCOPIC HYSTERECTOMY;  Surgeon: Janie Morning, MD;  Location: [facility];  Service: Gynecology;  Laterality: N/A;   SALPINGOOPHORECTOMY Bilateral 06/13/2015    Procedure: SALPINGO OOPHORECTOMY;  Surgeon: Janie Morning, MD;  Location: [facility];  Service: Gynecology;  Laterality: Bilateral;   STAPENDECTOMY     BILATERAL   TOTAL HIP ARTHROPLASTY Left 04/15/2022   Procedure: LEFT TOTAL HIP ARTHROPLASTY ANTERIOR APPROACH;  Surgeon: Leandrew Koyanagi, MD;  Location: [facility];  Service: Orthopedics;  Laterality: Left;  3-C   Social History   Occupational History   Occupation: retired    Fish farm manager: Retired  Tobacco Use   Smoking status: Never   Smokeless tobacco: Never  Scientific laboratory technician Use: Never used  Substance and Sexual Activity   Alcohol use: No   Drug use: No   Sexual activity: Yes    Birth control/protection: Post-menopausal

## 2022-04-30 ENCOUNTER — Encounter: Payer: Medicare Other | Admitting: Physician Assistant

## 2022-05-01 ENCOUNTER — Encounter: Payer: Medicare Other | Admitting: Physician Assistant

## 2022-05-15 ENCOUNTER — Encounter: Payer: Self-pay | Admitting: Orthopaedic Surgery

## 2022-05-15 ENCOUNTER — Other Ambulatory Visit (INDEPENDENT_AMBULATORY_CARE_PROVIDER_SITE_OTHER): Payer: Medicare Other

## 2022-05-15 ENCOUNTER — Ambulatory Visit (INDEPENDENT_AMBULATORY_CARE_PROVIDER_SITE_OTHER): Payer: Medicare Other | Admitting: Physician Assistant

## 2022-05-15 DIAGNOSIS — Z96642 Presence of left artificial hip joint: Secondary | ICD-10-CM

## 2022-05-15 NOTE — Progress Notes (Signed)
Post-Op Visit Note   Patient: [name]           Date of Birth: [birth year]           MRN: [id number] Visit Date: 05/15/2022 PCP: Excell Seltzer, MD   Assessment & Plan:  Chief Complaint:  Chief Complaint  Patient presents with   Left Hip - Follow-up    Left total hip arthroplasty 04/15/2022   Visit Diagnoses:  1. Status post total replacement of left hip     Plan: Patient is a pleasant 82 year old female who comes in today approximately 4 weeks status post left total hip replacement 04/15/2022.  She has been doing fairly well.  She is ambulating occasionally with a cane but most of the time unassisted.  She is taking Tylenol for pain but does note she has been taking a half oxycodone at night.  She has been compliant taking her Xarelto for DVT prophylaxis.  Examination of her left hip reveals a fully healed surgical scar without complication.  Painless hip flexion and logroll.  She is neurovascularly intact distally.  At this point, she will finish her remaining Xarelto.  She does not need a refill on oxycodone.  She will continue with her home exercise program.  Dental prophylaxis reinforced.  Follow-up in 8 weeks for repeat evaluation.  Call with concerns or questions in the meantime.  Follow-Up Instructions: Return in about 8 weeks (around 07/10/2022).   Orders:  Orders Placed This Encounter  Procedures   XR Pelvis 1-2 Views   No orders of the defined types were placed in this encounter.   Imaging: No results found.  PMFS History: Patient Active Problem List   Diagnosis Date Noted   Status post total replacement of left hip 04/15/2022   Pre-op examination 03/19/2022   COVID-19 virus infection 12/03/2021   Bilateral sensorineural hearing loss 11/08/2021   Dysfunction of right eustachian tube 10/24/2021   Primary osteoarthritis of left hip 04/17/2021   Left hip pain 04/05/2021   Strain of left groin 02/08/2021   Acute pain of right shoulder 02/08/2021   Neck pain  02/08/2021   Gastroesophageal reflux disease 07/01/2020   Decreased GFR 03/20/2018   Arthritis of carpometacarpal (CMC) joint of left thumb 03/08/2016   Trochanteric bursitis of left hip 03/08/2016   Medicare annual wellness visit, subsequent 03/04/2016   History of endometrial cancer    Lumbar back pain with radiculopathy affecting right lower extremity 03/03/2015   Counseling regarding end of life decision making 12/23/2013   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   Allergic rhinitis 01/14/2012   Vaginal laceration, old 02/01/2011   External hemorrhoids without complication 02/01/2011   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   Hx of malignant neoplasm of female breast 09/09/2007   HYPERCHOLESTEROLEMIA 02/24/2007   Essential hypertension, benign 10/10/2006   HEMORRHOIDS, EXTERNAL W/O COMPLICATION 10/10/2006   Osteopenia 10/10/2006   HX, PERSONAL, MALIGNANCY, BREAST 10/10/2006   Past Medical History:  Diagnosis Date   Arthritis    Breast cancer 04/2003   Invasive ductal carcinoma of the right breast   Complication of anesthesia    "slow to wake up"   Hard of hearing    Headache    Migraines years ago   Hypertension    Osteopenia     Family History  Problem Relation Age of Onset   Stroke Father    Hypertension Mother    Stroke Sister    Cancer Brother        Esophageal cancer  Cancer Brother        Lung smoker   Parkinson's disease Brother    Stroke Brother     Past Surgical History:  Procedure Laterality Date   BREAST SURGERY  02/05/2003   LUMPECTOMY   CATARACT EXTRACTION     COLONOSCOPY     EPISIOTOMY     HYSTEROSCOPY WITH D & C     LYMPH NODE BIOPSY N/A 06/13/2015   Procedure: SENTINEL  LYMPH NODE BIOPSY;  Surgeon: Laurette Schimke, MD;  Location: [facility];  Service: Gynecology;  Laterality: N/A;   PROCTOSCOPY     PROCTOSOMY REPAIR     ROBOTIC ASSISTED TOTAL HYSTERECTOMY N/A 06/13/2015   Procedure: XI ROBOTIC ASSISTED TOTAL ABDOMINAL LAPAROSCOPIC HYSTERECTOMY;   Surgeon: Laurette Schimke, MD;  Location: [facility];  Service: Gynecology;  Laterality: N/A;   SALPINGOOPHORECTOMY Bilateral 06/13/2015   Procedure: SALPINGO OOPHORECTOMY;  Surgeon: Laurette Schimke, MD;  Location: [facility];  Service: Gynecology;  Laterality: Bilateral;   STAPENDECTOMY     BILATERAL   TOTAL HIP ARTHROPLASTY Left 04/15/2022   Procedure: LEFT TOTAL HIP ARTHROPLASTY ANTERIOR APPROACH;  Surgeon: Tarry Kos, MD;  Location: [facility];  Service: Orthopedics;  Laterality: Left;  3-C   Social History   Occupational History   Occupation: retired    Associate Professor: Retired  Tobacco Use   Smoking status: Never   Smokeless tobacco: Never  Building services engineer Use: Never used  Substance and Sexual Activity   Alcohol use: No   Drug use: No   Sexual activity: Yes    Birth control/protection: Post-menopausal

## 2022-07-03 ENCOUNTER — Ambulatory Visit (INDEPENDENT_AMBULATORY_CARE_PROVIDER_SITE_OTHER): Payer: Medicare Other

## 2022-07-03 VITALS — Ht 61.0 in | Wt 125.0 lb

## 2022-07-03 DIAGNOSIS — Z0000 Encounter for general adult medical examination without abnormal findings: Secondary | ICD-10-CM

## 2022-07-03 NOTE — Patient Instructions (Signed)
[name] , Thank you for taking time to come for your Medicare Wellness Visit. I appreciate your ongoing commitment to your health goals. Please review the following plan we discussed and let me know if I can assist you in the future.   These are the goals we discussed:  Goals      Increase physical activity     Starting 03/04/2016, I will continue to walk at least 15-20 min 5 days per week.      Increase physical activity     Starting 03/05/2017, I will continue to walk at least 15 minutes 5 days per week.      Patient Stated     04/15/2019, I will continue to walk everyday for about 30 minutes.     Patient Stated     05/10/2020, I will continue to walk daily for about 10 minutes.      Patient Stated     Would like to get rid of hip pain     Patient Stated     No new goals        This is a list of the screening recommended for you and due dates:  Health Maintenance  Topic Date Due   DTaP/Tdap/Td vaccine (3 - Tdap) 02/16/2017   Zoster (Shingles) Vaccine (2 of 2) 01/25/2019   COVID-19 Vaccine (4 - 2023-24 season) 10/05/2021   Flu Shot  09/05/2022   Mammogram  12/22/2022   Medicare Annual Wellness Visit  07/03/2023   DEXA scan (bone density measurement)  08/13/2023   Pneumonia Vaccine  Completed   HPV Vaccine  Aged Out    Advanced directives: none  Conditions/risks identified: Aim for 30 minutes of exercise or brisk walking, 6-8 glasses of water, and 5 servings of fruits and vegetables each day.   Next appointment: Follow up in one year for your annual wellness visit 07/07/23 @ 9:45 televisit   Preventive Care 65 Years and Older, Female Preventive care refers to lifestyle choices and visits with your health care provider that can promote health and wellness. What does preventive care include? A yearly physical exam. This is also called an annual well check. Dental exams once or twice a year. Routine eye exams. Ask your health care provider how often you should have  your eyes checked. Personal lifestyle choices, including: Daily care of your teeth and gums. Regular physical activity. Eating a healthy diet. Avoiding tobacco and drug use. Limiting alcohol use. Practicing safe sex. Taking low-dose aspirin every day. Taking vitamin and mineral supplements as recommended by your health care provider. What happens during an annual well check? The services and screenings done by your health care provider during your annual well check will depend on your age, overall health, lifestyle risk factors, and family history of disease. Counseling  Your health care provider may ask you questions about your: Alcohol use. Tobacco use. Drug use. Emotional well-being. Home and relationship well-being. Sexual activity. Eating habits. History of falls. Memory and ability to understand (cognition). Work and work Astronomer. Reproductive health. Screening  You may have the following tests or measurements: Height, weight, and BMI. Blood pressure. Lipid and cholesterol levels. These may be checked every 5 years, or more frequently if you are over 67 years old. Skin check. Lung cancer screening. You may have this screening every year starting at age 83 if you have a 30-pack-year history of smoking and currently smoke or have quit within the past 15 years. Fecal occult blood test (FOBT) of the stool.  You may have this test every year starting at age 23. Flexible sigmoidoscopy or colonoscopy. You may have a sigmoidoscopy every 5 years or a colonoscopy every 10 years starting at age 76. Hepatitis C blood test. Hepatitis B blood test. Sexually transmitted disease (STD) testing. Diabetes screening. This is done by checking your blood sugar (glucose) after you have not eaten for a while (fasting). You may have this done every 1-3 years. Bone density scan. This is done to screen for osteoporosis. You may have this done starting at age 71. Mammogram. This may be done every  1-2 years. Talk to your health care provider about how often you should have regular mammograms. Talk with your health care provider about your test results, treatment options, and if necessary, the need for more tests. Vaccines  Your health care provider may recommend certain vaccines, such as: Influenza vaccine. This is recommended every year. Tetanus, diphtheria, and acellular pertussis (Tdap, Td) vaccine. You may need a Td booster every 10 years. Zoster vaccine. You may need this after age 3. Pneumococcal 13-valent conjugate (PCV13) vaccine. One dose is recommended after age 21. Pneumococcal polysaccharide (PPSV23) vaccine. One dose is recommended after age 83. Talk to your health care provider about which screenings and vaccines you need and how often you need them. This information is not intended to replace advice given to you by your health care provider. Make sure you discuss any questions you have with your health care provider. Document Released: 02/17/2015 Document Revised: 10/11/2015 Document Reviewed: 11/22/2014 Elsevier Interactive Patient Education  2017 [employer].  Fall Prevention in the Home Falls can cause injuries. They can happen to people of all ages. There are many things you can do to make your home safe and to help prevent falls. What can I do on the outside of my home? Regularly fix the edges of walkways and driveways and fix any cracks. Remove anything that might make you trip as you walk through a door, such as a raised step or threshold. Trim any bushes or trees on the path to your home. Use bright outdoor lighting. Clear any walking paths of anything that might make someone trip, such as rocks or tools. Regularly check to see if handrails are loose or broken. Make sure that both sides of any steps have handrails. Any raised decks and porches should have guardrails on the edges. Have any leaves, snow, or ice cleared regularly. Use sand or salt on walking  paths during winter. Clean up any spills in your garage right away. This includes oil or grease spills. What can I do in the bathroom? Use night lights. Install grab bars by the toilet and in the tub and shower. Do not use towel bars as grab bars. Use non-skid mats or decals in the tub or shower. If you need to sit down in the shower, use a plastic, non-slip stool. Keep the floor dry. Clean up any water that spills on the floor as soon as it happens. Remove soap buildup in the tub or shower regularly. Attach bath mats securely with double-sided non-slip rug tape. Do not have throw rugs and other things on the floor that can make you trip. What can I do in the bedroom? Use night lights. Make sure that you have a light by your bed that is easy to reach. Do not use any sheets or blankets that are too big for your bed. They should not hang down onto the floor. Have a firm chair that has  side arms. You can use this for support while you get dressed. Do not have throw rugs and other things on the floor that can make you trip. What can I do in the kitchen? Clean up any spills right away. Avoid walking on wet floors. Keep items that you use a lot in easy-to-reach places. If you need to reach something above you, use a strong step stool that has a grab bar. Keep electrical cords out of the way. Do not use floor polish or wax that makes floors slippery. If you must use wax, use non-skid floor wax. Do not have throw rugs and other things on the floor that can make you trip. What can I do with my stairs? Do not leave any items on the stairs. Make sure that there are handrails on both sides of the stairs and use them. Fix handrails that are broken or loose. Make sure that handrails are as long as the stairways. Check any carpeting to make sure that it is firmly attached to the stairs. Fix any carpet that is loose or worn. Avoid having throw rugs at the top or bottom of the stairs. If you do have  throw rugs, attach them to the floor with carpet tape. Make sure that you have a light switch at the top of the stairs and the bottom of the stairs. If you do not have them, ask someone to add them for you. What else can I do to help prevent falls? Wear shoes that: Do not have high heels. Have rubber bottoms. Are comfortable and fit you well. Are closed at the toe. Do not wear sandals. If you use a stepladder: Make sure that it is fully opened. Do not climb a closed stepladder. Make sure that both sides of the stepladder are locked into place. Ask someone to hold it for you, if possible. Clearly mark and make sure that you can see: Any grab bars or handrails. First and last steps. Where the edge of each step is. Use tools that help you move around (mobility aids) if they are needed. These include: Canes. Walkers. Scooters. Crutches. Turn on the lights when you go into a dark area. Replace any light bulbs as soon as they burn out. Set up your furniture so you have a clear path. Avoid moving your furniture around. If any of your floors are uneven, fix them. If there are any pets around you, be aware of where they are. Review your medicines with your doctor. Some medicines can make you feel dizzy. This can increase your chance of falling. Ask your doctor what other things that you can do to help prevent falls. This information is not intended to replace advice given to you by your health care provider. Make sure you discuss any questions you have with your health care provider. Document Released: 11/17/2008 Document Revised: 06/29/2015 Document Reviewed: 02/25/2014 Elsevier Interactive Patient Education  2017 [employer].

## 2022-07-03 NOTE — Progress Notes (Signed)
I connected with  Latoi Draffen Lender on 07/03/22 by a audio enabled telemedicine application and verified that I am speaking with the correct person using two identifiers.  Patient Location: Home  Provider Location: Home Office  I discussed the limitations of evaluation and management by telemedicine. The patient expressed understanding and agreed to proceed.  Subjective:   [name] is a 82 y.o. female who presents for Medicare Annual (Subsequent) preventive examination.  Review of Systems      Cardiac Risk Factors include: advanced age (>5men, >58 women);hypertension;sedentary lifestyle     Objective:    Today's Vitals   07/03/22 1037  Weight: 125 lb (56.7 kg)  Height: 5\' 1"  (1.549 m)   Body mass index is 23.62 kg/m.     07/03/2022   10:49 AM 04/11/2022    1:15 PM 05/15/2021   11:11 AM 05/10/2020    9:48 AM 04/15/2019    2:46 PM 03/05/2017    9:40 AM 03/04/2016    8:55 AM  Advanced Directives  Does Patient Have a Medical Advance Directive? No No No No No No Yes  Type of Building surveyor of Healthcare Power of Attorney in Chart?       No - copy requested  Would patient like information on creating a medical advance directive? No - Patient declined No - Patient declined No - Patient declined No - Patient declined No - Patient declined No - Patient declined     Current Medications (verified) Outpatient Encounter Medications as of 07/03/2022  Medication Sig   Cholecalciferol 1000 UNITS capsule Take 1,000 Units by mouth 2 (two) times daily.   Cyanocobalamin (B-12 PO) Take 1 tablet by mouth daily.    diclofenac Sodium (VOLTAREN) 1 % GEL Apply 2 g topically daily as needed (pain).   docusate sodium (COLACE) 100 MG capsule Take 1 capsule (100 mg total) by mouth daily as needed.   fluticasone (FLONASE) 50 MCG/ACT nasal spray Place 2 sprays into both nostrils daily. (Patient taking differently: Place 2 sprays into both nostrils daily as  needed for allergies.)   losartan-hydrochlorothiazide (HYZAAR) 50-12.5 MG tablet TAKE 1 TABLET BY MOUTH DAILY   sodium fluoride-calcium carbonate (FLORICAL) 8.3-364 MG CAPS Take 1 capsule by mouth 2 (two) times daily.   methocarbamol (ROBAXIN-750) 750 MG tablet Take 1 tablet (750 mg total) by mouth 2 (two) times daily as needed for muscle spasms. (Patient not taking: Reported on 07/03/2022)   ondansetron (ZOFRAN) 4 MG tablet Take 1 tablet (4 mg total) by mouth every 8 (eight) hours as needed for nausea or vomiting. (Patient not taking: Reported on 07/03/2022)   oxyCODONE-acetaminophen (PERCOCET) 5-325 MG tablet Take 1-2 tablets by mouth daily as needed. To be taken after surgery (Patient not taking: Reported on 07/03/2022)   rivaroxaban (XARELTO) 10 MG TABS tablet Take 1 tablet (10 mg total) by mouth daily. To be taken after surgery to prevent blood clots (Patient not taking: Reported on 07/03/2022)   No facility-administered encounter medications on file as of 07/03/2022.    Allergies (verified) Morphine and codeine and Penicillins   History: Past Medical History:  Diagnosis Date   Arthritis    Breast cancer (HCC) 04/2003   Invasive ductal carcinoma of the right breast   Complication of anesthesia    "slow to wake up"   Hard of hearing    Headache    Migraines years ago   Hypertension  Osteopenia    Past Surgical History:  Procedure Laterality Date   BREAST SURGERY  02/05/2003   LUMPECTOMY   CATARACT EXTRACTION     COLONOSCOPY     EPISIOTOMY     HYSTEROSCOPY WITH D & C     LYMPH NODE BIOPSY N/A 06/13/2015   Procedure: SENTINEL  LYMPH NODE BIOPSY;  Surgeon: Laurette Schimke, MD;  Location: [facility];  Service: Gynecology;  Laterality: N/A;   PROCTOSCOPY     PROCTOSOMY REPAIR     ROBOTIC ASSISTED TOTAL HYSTERECTOMY N/A 06/13/2015   Procedure: XI ROBOTIC ASSISTED TOTAL ABDOMINAL LAPAROSCOPIC HYSTERECTOMY;  Surgeon: Laurette Schimke, MD;  Location: [facility];  Service: Gynecology;   Laterality: N/A;   SALPINGOOPHORECTOMY Bilateral 06/13/2015   Procedure: SALPINGO OOPHORECTOMY;  Surgeon: Laurette Schimke, MD;  Location: [facility];  Service: Gynecology;  Laterality: Bilateral;   STAPENDECTOMY     BILATERAL   TOTAL HIP ARTHROPLASTY Left 04/15/2022   Procedure: LEFT TOTAL HIP ARTHROPLASTY ANTERIOR APPROACH;  Surgeon: Tarry Kos, MD;  Location: [facility];  Service: Orthopedics;  Laterality: Left;  3-C   Family History  Problem Relation Age of Onset   Stroke Father    Hypertension Mother    Stroke Sister    Cancer Brother        Esophageal cancer   Cancer Brother        Lung smoker   Parkinson's disease Brother    Stroke Brother    Social History   Socioeconomic History   Marital status: Married    Spouse name: Not on file   Number of children: Not on file   Years of education: Not on file   Highest education level: Not on file  Occupational History   Occupation: retired    Associate Professor: Retired  Tobacco Use   Smoking status: Never   Smokeless tobacco: Never  Building services engineer Use: Never used  Substance and Sexual Activity   Alcohol use: No   Drug use: No   Sexual activity: Yes    Birth control/protection: Post-menopausal  Other Topics Concern   Not on file  Social History Narrative   Regular exercise--no, occ goes to [employer]   Diet: healthy, fruits and veggies   No living will, no HCPOA (reviewed 2013)   Social Determinants of Health   Financial Resource Strain: Low Risk  (07/03/2022)   Overall Financial Resource Strain (CARDIA)    Difficulty of Paying Living Expenses: Not hard at all  Food Insecurity: No Food Insecurity (07/03/2022)   Hunger Vital Sign    Worried About Running Out of Food in the Last Year: Never true    Ran Out of Food in the Last Year: Never true  Transportation Needs: No Transportation Needs (07/03/2022)   PRAPARE - Administrator, Civil Service (Medical): No    Lack of Transportation (Non-Medical): No  Physical Activity:  Inactive (07/03/2022)   Exercise Vital Sign    Days of Exercise per Week: 0 days    Minutes of Exercise per Session: 0 min  Stress: No Stress Concern Present (07/03/2022)   [employer] of Occupational Health - Occupational Stress Questionnaire    Feeling of Stress : Not at all  Social Connections: Moderately Integrated (07/03/2022)   Social Connection and Isolation Panel [NHANES]    Frequency of Communication with Friends and Family: More than three times a week    Frequency of Social Gatherings with Friends and Family: More than three times a week  Attends Religious Services: More than 4 times per year    Active Member of Clubs or Organizations: No    Attends Banker Meetings: Never    Marital Status: Married    Tobacco Counseling Counseling given: Not Answered   Clinical Intake:  Pre-visit preparation completed: Yes  Pain : No/denies pain     Nutritional Risks: None Diabetes: No  How often do you need to have someone help you when you read instructions, pamphlets, or other written materials from your doctor or pharmacy?: 1 - Never  Diabetic? no  Interpreter Needed?: No  Information entered by :: C.Emilie Carp LPN   Activities of Daily Living    07/03/2022   10:50 AM 04/11/2022    1:17 PM  In your present state of health, do you have any difficulty performing the following activities:  Hearing? 1   Comment wears aids   Vision? 0   Difficulty concentrating or making decisions? 1   Comment occasionally   Walking or climbing stairs? 0   Dressing or bathing? 0   Doing errands, shopping? 0 0  Preparing Food and eating ? N   Using the Toilet? N   In the past six months, have you accidently leaked urine? N   Do you have problems with loss of bowel control? N   Managing your Medications? N   Managing your Finances? N   Housekeeping or managing your Housekeeping? N     Patient Care Team: Excell Seltzer, MD as PCP - General Blair Promise, OD as  Consulting Physician (Optometry) Adolphus Birchwood, MD as Consulting Physician (Obstetrics and Gynecology) Nita Sells, MD as Consulting Physician (Dermatology) Carrington Clamp, MD as Consulting Physician (Obstetrics and Gynecology)  Indicate any recent Medical Services you may have received from other than [facility] providers in the past year (date may be approximate).     Assessment:   This is a routine wellness examination for [name].  Hearing/Vision screen Hearing Screening - Comments:: Hearing aids  Vision Screening - Comments:: [facility]  Dietary issues and exercise activities discussed: Current Exercise Habits: The patient does not participate in regular exercise at present, Exercise limited by: None identified   Goals Addressed             This Visit's Progress    Patient Stated       No new goals       Depression Screen    07/03/2022   10:47 AM 05/15/2021   11:17 AM 05/10/2020    9:52 AM 04/15/2019    2:47 PM 03/20/2018   10:15 AM 03/05/2017    9:40 AM 03/04/2016    8:55 AM  PHQ 2/9 Scores  PHQ - 2 Score 0 0 0 0 0 0 0  PHQ- 9 Score   0 0  0     Fall Risk    07/03/2022   10:50 AM 03/19/2022    9:43 AM 05/15/2021   11:11 AM 05/10/2020    9:51 AM 04/15/2019    2:47 PM  Fall Risk   Falls in the past year? 0 0 0 0 0  Number falls in past yr: 0 0 0 0 0  Injury with Fall? 0 0 0 0 0  Risk for fall due to : No Fall Risks No Fall Risks  Medication side effect Medication side effect  Follow up Falls prevention discussed;Falls evaluation completed Falls evaluation completed Falls evaluation completed;Education provided;Falls prevention discussed Falls evaluation completed;Falls prevention discussed Falls evaluation completed;Falls prevention  discussed    FALL RISK PREVENTION PERTAINING TO THE HOME:  Any stairs in or around the home? Yes  If so, are there any without handrails? No  Home free of loose throw rugs in walkways, pet beds, electrical cords, etc? Yes   Adequate lighting in your home to reduce risk of falls? Yes   ASSISTIVE DEVICES UTILIZED TO PREVENT FALLS:  Life alert? No  Use of a cane, walker or w/c? No  Grab bars in the bathroom? No  Shower chair or bench in shower? Yes  Elevated toilet seat or a handicapped toilet? Yes    Cognitive Function:    05/10/2020    9:54 AM 04/15/2019    2:50 PM 03/05/2017    9:25 AM 03/04/2016    8:55 AM  MMSE - Mini Mental State Exam  Orientation to time 5 5 5 5   Orientation to Place 5 5 5 5   Registration 3 3 3 3   Attention/ Calculation 5 5 0 0  Recall 3 3 3 3   Language- name 2 objects   0 0  Language- repeat 1 1 1 1   Language- follow 3 step command   3 3  Language- read & follow direction   0 0  Write a sentence   0 0  Copy design   0 0  Total score   20 20        07/03/2022   10:51 AM 05/15/2021   11:13 AM  6CIT Screen  What Year? 0 points 0 points  What month? 0 points 0 points  What time? 0 points 0 points  Count back from 20 0 points 0 points  Months in reverse 0 points 0 points  Repeat phrase 2 points 0 points  Total Score 2 points 0 points    Immunizations Immunization History  Administered Date(s) Administered   Fluad Quad(high Dose 65+) 10/07/2018, 10/12/2020, 10/24/2021   Influenza, High Dose Seasonal PF 11/20/2016, 11/30/2019   Influenza, Seasonal, Injecte, Preservative Fre 01/14/2012, 11/14/2015   Influenza,inj,Quad PF,6+ Mos 11/24/2012, 12/23/2013, 11/24/2014, 12/18/2017   PFIZER(Purple Top)SARS-COV-2 Vaccination 03/08/2019, 04/02/2019, 11/02/2019   Pneumococcal Conjugate-13 12/23/2013   Pneumococcal Polysaccharide-23 03/31/2003, 03/03/2015   Td 02/04/1973, 02/17/2007   Zoster Recombinat (Shingrix) 11/18/2018, 11/30/2018   Zoster, Live 03/24/2007    TDAP status: Due, Education has been provided regarding the importance of this vaccine. Advised may receive this vaccine at local pharmacy or Health Dept. Aware to provide a copy of the vaccination record if obtained  from local pharmacy or Health Dept. Verbalized acceptance and understanding.  Flu Vaccine status: Up to date  Pneumococcal vaccine status: Up to date  Covid-19 vaccine status: Information provided on how to obtain vaccines.   Qualifies for Shingles Vaccine? Yes   Zostavax completed Yes   Shingrix Completed?: No.    Education has been provided regarding the importance of this vaccine. Patient has been advised to call insurance company to determine out of pocket expense if they have not yet received this vaccine. Advised may also receive vaccine at local pharmacy or Health Dept. Verbalized acceptance and understanding.  Screening Tests Health Maintenance  Topic Date Due   DTaP/Tdap/Td (3 - Tdap) 02/16/2017   Zoster Vaccines- Shingrix (2 of 2) 01/25/2019   COVID-19 Vaccine (4 - 2023-24 season) 10/05/2021   INFLUENZA VACCINE  09/05/2022   MAMMOGRAM  12/22/2022   Medicare Annual Wellness (AWV)  07/03/2023   DEXA SCAN  08/13/2023   Pneumonia Vaccine 75+ Years old  Completed   HPV  VACCINES  Aged Out    Health Maintenance  Health Maintenance Due  Topic Date Due   DTaP/Tdap/Td (3 - Tdap) 02/16/2017   Zoster Vaccines- Shingrix (2 of 2) 01/25/2019   COVID-19 Vaccine (4 - 2023-24 season) 10/05/2021    Colorectal cancer screening: No longer required.   Mammogram status: Completed 12/21/21. Repeat every year Pt will call for appointment.  Bone Density - Declined  Lung Cancer Screening: (Low Dose CT Chest recommended if Age 37-80 years, 30 pack-year currently smoking OR have quit w/in 15years.) does not qualify.   Lung Cancer Screening Referral: no  Additional Screening:  Hepatitis C Screening: does not qualify; Completed no  Vision Screening: Recommended annual ophthalmology exams for early detection of glaucoma and other disorders of the eye. Is the patient up to date with their annual eye exam?  Yes  Who is the provider or what is the name of the office in which the patient  attends annual eye exams? [facility] If pt is not established with a provider, would they like to be referred to a provider to establish care? Yes .   Dental Screening: Recommended annual dental exams for proper oral hygiene  Community Resource Referral / Chronic Care Management: CRR required this visit?  No   CCM required this visit?  No      Plan:     I have personally reviewed and noted the following in the patient's chart:   Medical and social history Use of alcohol, tobacco or illicit drugs  Current medications and supplements including opioid prescriptions. Patient is not currently taking opioid prescriptions. Functional ability and status Nutritional status Physical activity Advanced directives List of other physicians Hospitalizations, surgeries, and ER visits in previous 12 months Vitals Screenings to include cognitive, depression, and falls Referrals and appointments  In addition, I have reviewed and discussed with patient certain preventive protocols, quality metrics, and best practice recommendations. A written personalized care plan for preventive services as well as general preventive health recommendations were provided to patient.     Maryan Puls, LPN   [id number]/61/0960   Nurse Notes: no concerns

## 2022-07-23 ENCOUNTER — Ambulatory Visit: Payer: Medicare Other | Admitting: Orthopaedic Surgery

## 2022-09-30 DIAGNOSIS — Z9841 Cataract extraction status, right eye: Secondary | ICD-10-CM | POA: Diagnosis not present

## 2022-09-30 DIAGNOSIS — H52223 Regular astigmatism, bilateral: Secondary | ICD-10-CM | POA: Diagnosis not present

## 2022-09-30 DIAGNOSIS — Z9842 Cataract extraction status, left eye: Secondary | ICD-10-CM | POA: Diagnosis not present

## 2022-11-14 ENCOUNTER — Other Ambulatory Visit: Payer: Self-pay | Admitting: Family Medicine

## 2023-02-13 ENCOUNTER — Other Ambulatory Visit: Payer: Self-pay | Admitting: Family Medicine

## 2023-02-13 NOTE — Telephone Encounter (Signed)
Please schedule CPE with fasting labs prior with Dr. Bedsole.  

## 2023-02-13 NOTE — Telephone Encounter (Signed)
 Spoke to pt's husband, scheduled cpe for 02/27/23

## 2023-02-25 DIAGNOSIS — C44319 Basal cell carcinoma of skin of other parts of face: Secondary | ICD-10-CM | POA: Diagnosis not present

## 2023-02-27 ENCOUNTER — Encounter: Payer: Self-pay | Admitting: Family Medicine

## 2023-02-27 ENCOUNTER — Ambulatory Visit: Payer: Medicare Other | Admitting: Family Medicine

## 2023-02-27 VITALS — BP 150/82 | HR 93 | Temp 97.8°F | Ht 60.25 in | Wt 120.5 lb

## 2023-02-27 DIAGNOSIS — E559 Vitamin D deficiency, unspecified: Secondary | ICD-10-CM

## 2023-02-27 DIAGNOSIS — Z0000 Encounter for general adult medical examination without abnormal findings: Secondary | ICD-10-CM

## 2023-02-27 DIAGNOSIS — I10 Essential (primary) hypertension: Secondary | ICD-10-CM

## 2023-02-27 DIAGNOSIS — M8580 Other specified disorders of bone density and structure, unspecified site: Secondary | ICD-10-CM

## 2023-02-27 DIAGNOSIS — E7800 Pure hypercholesterolemia, unspecified: Secondary | ICD-10-CM

## 2023-02-27 NOTE — Assessment & Plan Note (Signed)
Chronic, inadequate control in office today.  We will continue losartan hydrochlorothiazide 50/12.5 mg p.o. daily.  She will follow her blood pressure at home and call me with update in 2 weeks.  We may need to increase her dose.

## 2023-02-27 NOTE — Patient Instructions (Addendum)
Follow BP at home a few times a week.. call if > 140/90 consistently.  Work on regular exercise.  Plan to call to schedule mammogram and bone density every 2 years.. next in 12/2023.

## 2023-02-27 NOTE — Progress Notes (Signed)
Patient ID: [name], female    DOB: [birth year], 83 y.o.   MRN: [id number]  This visit was conducted in person.  BP (!) 150/82   Pulse 93   Temp 97.8 F (36.6 C) (Oral)   Ht 5' 0.25" (1.53 m)   Wt 120 lb 8 oz (54.7 kg)   SpO2 94%   BMI 23.34 kg/m    CC:  Chief Complaint  Patient presents with   Annual Exam    MCR prt 2 [AWV- 07/03/22].    Subjective:   HPI: [name] is a 83 y.o. female presenting on 02/27/2023 for Annual Exam [facility] prt 2 [AWV- 07/03/22].)  The patient presents for  complete physical and review of chronic health problems. He/She also has the following acute concerns today:   Feeling better since left hip surgery... feeling much better.  The patient saw a LPN or RN for medicare wellness visit. 07/03/2022  Prevention and wellness was reviewed in detail. Note reviewed and important notes copied below.  Hypertension:  Previously well controlled on losartan hydrochlorothiazide 50/12.5 mg p.o. daily. BP Readings from Last 3 Encounters:  02/27/23 (!) 150/82  04/16/22 124/76  04/11/22 (!) 154/82  Using medication without problems or lightheadedness:  none Chest pain with exertion: none Edema:none Short of breath: none Average home BPs: not checking lately Other issues:  Elevated Cholesterol: Poor control in past , patient refuses trial of statin.   She feels red yeast rice may have casue body ache.. so stopped. Lab Results  Component Value Date   CHOL 190 03/05/2023   HDL 54.00 03/05/2023   LDLCALC 121 (H) 03/05/2023   LDLDIRECT 148.1 11/17/2012   TRIG 74.0 03/05/2023   CHOLHDL 4 03/05/2023  Using medications without problems: Muscle aches:  Diet compliance: poor Exercise: walking some Other complaints:     Hx of endometrial cancer Dx 2017: Stage 1A Grade 2 endometrial cancer S/p robotic assisted total hysterectomy, BSO, sentinel lymph node mapping on 06/13/15. no LVSI, 40% myometrial invasion, negative lymph nodes.  Followed in past  by  Dr. Andrey Farmer. Surgeon Dr. Nelly Rout. Released.  History of breast cancer,   2005  Relevant past medical, surgical, family and social history reviewed and updated as indicated. Interim medical history since our last visit reviewed. Allergies and medications reviewed and updated. Outpatient Medications Prior to Visit  Medication Sig Dispense Refill   Cholecalciferol 1000 UNITS capsule Take 1,000 Units by mouth 2 (two) times daily.     Cyanocobalamin (B-12 PO) Take 1 tablet by mouth daily.      docusate sodium (COLACE) 100 MG capsule Take 1 capsule (100 mg total) by mouth daily as needed. 30 capsule 2   fluticasone (FLONASE) 50 MCG/ACT nasal spray Place 2 sprays into both nostrils daily. (Patient taking differently: Place 2 sprays into both nostrils daily as needed for allergies.) 16 g 11   losartan-hydrochlorothiazide (HYZAAR) 50-12.5 MG tablet TAKE 1 TABLET BY MOUTH DAILY 90 tablet 1   sodium fluoride-calcium carbonate (FLORICAL) 8.3-364 MG CAPS Take 1 capsule by mouth 2 (two) times daily.     diclofenac Sodium (VOLTAREN) 1 % GEL Apply 2 g topically daily as needed (pain).     methocarbamol (ROBAXIN-750) 750 MG tablet Take 1 tablet (750 mg total) by mouth 2 (two) times daily as needed for muscle spasms. (Patient not taking: Reported on 07/03/2022) 20 tablet 2   ondansetron (ZOFRAN) 4 MG tablet Take 1 tablet (4 mg total) by mouth every 8 (eight) hours  as needed for nausea or vomiting. (Patient not taking: Reported on 07/03/2022) 40 tablet 0   oxyCODONE-acetaminophen (PERCOCET) 5-325 MG tablet Take 1-2 tablets by mouth daily as needed. To be taken after surgery (Patient not taking: Reported on 07/03/2022) 20 tablet 0   rivaroxaban (XARELTO) 10 MG TABS tablet Take 1 tablet (10 mg total) by mouth daily. To be taken after surgery to prevent blood clots (Patient not taking: Reported on 07/03/2022) 35 tablet 0   No facility-administered medications prior to visit.     Per HPI unless specifically indicated in  ROS section below Review of Systems  Constitutional:  Negative for fatigue and fever.  HENT:  Positive for postnasal drip. Negative for congestion.   Eyes:  Negative for pain.  Respiratory:  Negative for cough and shortness of breath.   Cardiovascular:  Negative for chest pain, palpitations and leg swelling.  Gastrointestinal:  Negative for abdominal pain.  Genitourinary:  Negative for dysuria and vaginal bleeding.  Musculoskeletal:  Negative for back pain.  Neurological:  Negative for syncope, light-headedness and headaches.  Psychiatric/Behavioral:  Negative for dysphoric mood.    Objective:  BP (!) 150/82   Pulse 93   Temp 97.8 F (36.6 C) (Oral)   Ht 5' 0.25" (1.53 m)   Wt 120 lb 8 oz (54.7 kg)   SpO2 94%   BMI 23.34 kg/m   Wt Readings from Last 3 Encounters:  02/27/23 120 lb 8 oz (54.7 kg)  07/03/22 125 lb (56.7 kg)  04/15/22 125 lb 10.6 oz (57 kg)      Physical Exam Vitals and nursing note reviewed.  Constitutional:      General: She is not in acute distress.    Appearance: Normal appearance. She is well-developed. She is not ill-appearing or toxic-appearing.  HENT:     Head: Normocephalic.     Right Ear: Hearing, ear canal and external ear normal. A middle ear effusion is present. There is no impacted cerumen.     Left Ear: Hearing, tympanic membrane, ear canal and external ear normal.  No middle ear effusion. There is no impacted cerumen.     Nose: Nose normal.  Eyes:     General: Lids are normal. Lids are everted, no foreign bodies appreciated.     Conjunctiva/sclera: Conjunctivae normal.     Pupils: Pupils are equal, round, and reactive to light.  Neck:     Thyroid: No thyroid mass or thyromegaly.     Vascular: No carotid bruit.     Trachea: Trachea normal.  Cardiovascular:     Rate and Rhythm: Normal rate and regular rhythm.     Heart sounds: Normal heart sounds, S1 normal and S2 normal. No murmur heard.    No gallop.  Pulmonary:     Effort: Pulmonary  effort is normal. No respiratory distress.     Breath sounds: Normal breath sounds. No wheezing, rhonchi or rales.  Abdominal:     General: Bowel sounds are normal. There is no distension or abdominal bruit.     Palpations: Abdomen is soft. There is no fluid wave or mass.     Tenderness: There is no abdominal tenderness. There is no guarding or rebound.     Hernia: No hernia is present.  Musculoskeletal:     Cervical back: Normal range of motion and neck supple.  Lymphadenopathy:     Cervical: No cervical adenopathy.  Skin:    General: Skin is warm and dry.     Findings: No rash.  Neurological:     Mental Status: She is alert.     Cranial Nerves: No cranial nerve deficit.     Sensory: No sensory deficit.  Psychiatric:        Mood and Affect: Mood is not anxious or depressed.        Speech: Speech normal.        Behavior: Behavior normal. Behavior is cooperative.        Judgment: Judgment normal.       Results for orders placed or performed during the hospital encounter of 04/15/22  CBC   Collection Time: 04/16/22  2:03 AM  Result Value Ref Range   WBC 13.3 (H) 4.0 - 10.5 K/uL   RBC 4.23 3.87 - 5.11 MIL/uL   Hemoglobin 12.1 12.0 - 15.0 g/dL   HCT 13.0 86.5 - 78.4 %   MCV 86.5 80.0 - 100.0 fL   MCH 28.6 26.0 - 34.0 pg   MCHC 33.1 30.0 - 36.0 g/dL   RDW 69.6 29.5 - 28.4 %   Platelets 176 150 - 400 K/uL   nRBC 0.0 0.0 - 0.2 %     COVID 19 screen:  No recent travel or known exposure to COVID19 The patient denies respiratory symptoms of COVID 19 at this time. The importance of social distancing was discussed today.   Assessment and Plan The patient's preventative maintenance and recommended screening tests for an annual wellness exam were reviewed in full today. Brought up to date unless services declined.  Counselled on the importance of diet, exercise, and its role in overall health and mortality. The patient's FH and SH was reviewed, including their home life, tobacco  status, and drug and alcohol status.    TAH Hx of endometrial cancer Vaccines: uptodate Shingrix. COVID vaccine x 3 Flu vaccine given 12/2022, consider Tdap Colonoscopy: 06/2005,  not indicated due to age Mammo:  negative 12/2021, hx of breast cancer 20 years ago... plan q 2 years. DEXA:  08/2018:stable osteopenia, plan repeat in 2025... order placed Non smoker.  Problem List Items Addressed This Visit     Essential hypertension, benign (Chronic)   Chronic, inadequate control in office today.  We will continue losartan hydrochlorothiazide 50/12.5 mg p.o. daily.  She will follow her blood pressure at home and call me with update in 2 weeks.  We may need to increase her dose.      HYPERCHOLESTEROLEMIA (Chronic)    Due for re-eval. Patient refuses trial of statin.  She  Is not keen on restarting  Red yeast rice. Provided additional information on herbal treatments for cholesterol.  She will try to get back on track with low-cholesterol, low-fat diet and regular exercise.      Relevant Orders   Lipid panel (Completed)   Comprehensive metabolic panel (Completed)   Osteopenia   Relevant Orders   DG Bone Density   Other Visit Diagnoses       Routine general medical examination at a health care facility    -  Primary     Vitamin D deficiency       Relevant Orders   VITAMIN D 25 Hydroxy (Vit-D Deficiency, Fractures) (Completed)        .  Kerby Nora, MD

## 2023-02-27 NOTE — Assessment & Plan Note (Addendum)
Due for re-eval. Patient refuses trial of statin.  She  Is not keen on restarting  Red yeast rice. Provided additional information on herbal treatments for cholesterol.  She will try to get back on track with low-cholesterol, low-fat diet and regular exercise.

## 2023-03-05 ENCOUNTER — Other Ambulatory Visit (INDEPENDENT_AMBULATORY_CARE_PROVIDER_SITE_OTHER): Payer: Medicare Other

## 2023-03-05 DIAGNOSIS — E559 Vitamin D deficiency, unspecified: Secondary | ICD-10-CM | POA: Diagnosis not present

## 2023-03-05 DIAGNOSIS — E7800 Pure hypercholesterolemia, unspecified: Secondary | ICD-10-CM | POA: Diagnosis not present

## 2023-03-05 LAB — LIPID PANEL
Cholesterol: 190 mg/dL (ref 0–200)
HDL: 54 mg/dL (ref >39.00)
LDL Cholesterol: 121 mg/dL — ABNORMAL HIGH (ref 0–99)
NonHDL: 136.25
Total CHOL/HDL Ratio: 4
Triglycerides: 74 mg/dL (ref 0.0–149.0)
VLDL: 14.8 mg/dL (ref 0.0–40.0)

## 2023-03-05 LAB — COMPREHENSIVE METABOLIC PANEL
ALT: 17 U/L (ref 0–35)
AST: 20 U/L (ref 0–37)
Albumin: 3.9 g/dL (ref 3.5–5.2)
Alkaline Phosphatase: 47 U/L (ref 39–117)
BUN: 27 mg/dL — ABNORMAL HIGH (ref 6–23)
CO2: 28 meq/L (ref 19–32)
Calcium: 9.4 mg/dL (ref 8.4–10.5)
Chloride: 103 meq/L (ref 96–112)
Creatinine, Ser: 0.93 mg/dL (ref 0.40–1.20)
GFR: 56.29 mL/min — ABNORMAL LOW (ref >60.00)
Glucose, Bld: 80 mg/dL (ref 70–99)
Potassium: 3.8 meq/L (ref 3.5–5.1)
Sodium: 140 meq/L (ref 135–145)
Total Bilirubin: 0.7 mg/dL (ref 0.2–1.2)
Total Protein: 6.4 g/dL (ref 6.0–8.3)

## 2023-03-05 LAB — VITAMIN D 25 HYDROXY (VIT D DEFICIENCY, FRACTURES): VITD: 85.61 ng/mL (ref 30.00–100.00)

## 2023-03-06 ENCOUNTER — Encounter: Payer: Self-pay | Admitting: Family Medicine

## 2023-04-08 DIAGNOSIS — Z08 Encounter for follow-up examination after completed treatment for malignant neoplasm: Secondary | ICD-10-CM | POA: Diagnosis not present

## 2023-04-08 DIAGNOSIS — X32XXXA Exposure to sunlight, initial encounter: Secondary | ICD-10-CM | POA: Diagnosis not present

## 2023-04-08 DIAGNOSIS — Z85828 Personal history of other malignant neoplasm of skin: Secondary | ICD-10-CM | POA: Diagnosis not present

## 2023-04-08 DIAGNOSIS — L570 Actinic keratosis: Secondary | ICD-10-CM | POA: Diagnosis not present

## 2023-05-14 ENCOUNTER — Other Ambulatory Visit: Payer: Self-pay | Admitting: Family Medicine

## 2023-07-07 ENCOUNTER — Ambulatory Visit (INDEPENDENT_AMBULATORY_CARE_PROVIDER_SITE_OTHER)

## 2023-07-07 VITALS — Ht 60.25 in | Wt 120.0 lb

## 2023-07-07 DIAGNOSIS — Z0000 Encounter for general adult medical examination without abnormal findings: Secondary | ICD-10-CM

## 2023-07-07 NOTE — Progress Notes (Signed)
 Subjective:   [name] is a 83 y.o. who presents for a Medicare Wellness preventive visit.  As a reminder, Annual Wellness Visits don't include a physical exam, and some assessments may be limited, especially if this visit is performed virtually. We may recommend an in-person follow-up visit with your provider if needed.  Visit Complete: Virtual I connected with  [name] Overall on 07/07/23 by a audio enabled telemedicine application and verified that I am speaking with the correct person using two identifiers.  Patient Location: Home  Provider Location: Home Office  I discussed the limitations of evaluation and management by telemedicine. The patient expressed understanding and agreed to proceed.  Vital Signs: Because this visit was a virtual/telehealth visit, some criteria may be missing or patient reported. Any vitals not documented were not able to be obtained and vitals that have been documented are patient reported.  VideoDeclined- This patient declined Librarian, academic. Therefore the visit was completed with audio only.  Persons Participating in Visit: Patient.  AWV Questionnaire: No: Patient Medicare AWV questionnaire was not completed prior to this visit.  Cardiac Risk Factors include: advanced age (>18men, >24 women);hypertension     Objective:     Today's Vitals   07/07/23 1034  Weight: 120 lb (54.4 kg)  Height: 5' 0.25" (1.53 m)   Body mass index is 23.24 kg/m.     07/07/2023   10:37 AM 07/03/2022   10:49 AM 04/11/2022    1:15 PM 05/15/2021   11:11 AM 05/10/2020    9:48 AM 04/15/2019    2:46 PM 03/05/2017    9:40 AM  Advanced Directives  Does Patient Have a Medical Advance Directive? No No No No No No No  Would patient like information on creating a medical advance directive? Yes (MAU/Ambulatory/Procedural Areas - Information given) No - Patient declined No - Patient declined No - Patient declined No - Patient declined No -  Patient declined No - Patient declined    Current Medications (verified) Outpatient Encounter Medications as of 07/07/2023  Medication Sig   Cholecalciferol 1000 UNITS capsule Take 1,000 Units by mouth 2 (two) times daily.   Cyanocobalamin (B-12 PO) Take 1 tablet by mouth daily.    fluticasone  (FLONASE ) 50 MCG/ACT nasal spray Place 2 sprays into both nostrils daily. (Patient taking differently: Place 2 sprays into both nostrils daily as needed for allergies.)   losartan -hydrochlorothiazide  (HYZAAR) 50-12.5 MG tablet TAKE 1 TABLET BY MOUTH DAILY   sodium fluoride-calcium carbonate (FLORICAL) 8.3-364 MG CAPS Take 1 capsule by mouth 2 (two) times daily.   No facility-administered encounter medications on file as of 07/07/2023.    Allergies (verified) Morphine and codeine and Penicillins   History: Past Medical History:  Diagnosis Date   Arthritis    Breast cancer (HCC) 04/2003   Invasive ductal carcinoma of the right breast   Complication of anesthesia    "slow to wake up"   Hard of hearing    Headache    Migraines years ago   Hypertension    Osteopenia    Past Surgical History:  Procedure Laterality Date   BREAST SURGERY  02/05/2003   LUMPECTOMY   CATARACT EXTRACTION     COLONOSCOPY     EPISIOTOMY     HYSTEROSCOPY WITH D & C     LYMPH NODE BIOPSY N/A 06/13/2015   Procedure: SENTINEL  LYMPH NODE BIOPSY;  Surgeon: Terry Ficks, MD;  Location: [facility];  Service: Gynecology;  Laterality: N/A;  PROCTOSCOPY     PROCTOSOMY REPAIR     ROBOTIC ASSISTED TOTAL HYSTERECTOMY N/A 06/13/2015   Procedure: XI ROBOTIC ASSISTED TOTAL ABDOMINAL LAPAROSCOPIC HYSTERECTOMY;  Surgeon: Terry Ficks, MD;  Location: [facility];  Service: Gynecology;  Laterality: N/A;   SALPINGOOPHORECTOMY Bilateral 06/13/2015   Procedure: SALPINGO OOPHORECTOMY;  Surgeon: Terry Ficks, MD;  Location: [facility];  Service: Gynecology;  Laterality: Bilateral;   STAPENDECTOMY     BILATERAL   TOTAL HIP ARTHROPLASTY Left  04/15/2022   Procedure: LEFT TOTAL HIP ARTHROPLASTY ANTERIOR APPROACH;  Surgeon: Wes Hamman, MD;  Location: [facility];  Service: Orthopedics;  Laterality: Left;  3-C   Family History  Problem Relation Age of Onset   Stroke Father    Hypertension Mother    Stroke Sister    Cancer Brother        Esophageal cancer   Cancer Brother        Lung smoker   Parkinson's disease Brother    Stroke Brother    Social History   Socioeconomic History   Marital status: Married    Spouse name: Not on file   Number of children: Not on file   Years of education: Not on file   Highest education level: Not on file  Occupational History   Occupation: retired    Associate Professor: Retired  Tobacco Use   Smoking status: Never   Smokeless tobacco: Never  Vaping Use   Vaping status: Never Used  Substance and Sexual Activity   Alcohol use: No   Drug use: No   Sexual activity: Yes    Birth control/protection: Post-menopausal  Other Topics Concern   Not on file  Social History Narrative   Regular exercise--no, occ goes to [employer]   Diet: healthy, fruits and veggies   No living will, no HCPOA (reviewed 2013)   Social Drivers of Corporate investment banker Strain: Low Risk  (07/07/2023)   Overall Financial Resource Strain (CARDIA)    Difficulty of Paying Living Expenses: Not hard at all  Food Insecurity: No Food Insecurity (07/07/2023)   Hunger Vital Sign    Worried About Running Out of Food in the Last Year: Never true    Ran Out of Food in the Last Year: Never true  Transportation Needs: No Transportation Needs (07/07/2023)   PRAPARE - Administrator, Civil Service (Medical): No    Lack of Transportation (Non-Medical): No  Physical Activity: Unknown (07/07/2023)   Exercise Vital Sign    Days of Exercise per Week: 0 days    Minutes of Exercise per Session: Not on file  Stress: No Stress Concern Present (07/07/2023)   [employer] of Occupational Health - Occupational Stress Questionnaire     Feeling of Stress : Not at all  Social Connections: Moderately Integrated (07/07/2023)   Social Connection and Isolation Panel [NHANES]    Frequency of Communication with Friends and Family: More than three times a week    Frequency of Social Gatherings with Friends and Family: More than three times a week    Attends Religious Services: More than 4 times per year    Active Member of [employer] or Organizations: No    Attends Banker Meetings: Never    Marital Status: Married    Tobacco Counseling Counseling given: Not Answered    Clinical Intake:  Pre-visit preparation completed: Yes  Pain : No/denies pain     Diabetes: No  No results found for: "HGBA1C"   How often  do you need to have someone help you when you read instructions, pamphlets, or other written materials from your doctor or pharmacy?: 1 - Never  Interpreter Needed?: No  Information entered by :: Seabron Cypress LPN   Activities of Daily Living     07/07/2023   10:37 AM  In your present state of health, do you have any difficulty performing the following activities:  Hearing? 0  Vision? 0  Difficulty concentrating or making decisions? 0  Walking or climbing stairs? 0  Dressing or bathing? 0  Doing errands, shopping? 0  Preparing Food and eating ? N  Using the Toilet? N  In the past six months, have you accidently leaked urine? N  Do you have problems with loss of bowel control? N  Managing your Medications? N  Managing your Finances? N  Housekeeping or managing your Housekeeping? N    Patient Care Team: Judithann Novas, MD as PCP - General Arminda Berth, OD as Consulting Physician (Optometry) Alphonso Aschoff, MD as Consulting Physician (Obstetrics and Gynecology) Denman Fischer, MD as Consulting Physician (Dermatology) Matt Song, MD as Consulting Physician (Obstetrics and Gynecology)  I have updated your Care Teams any recent Medical Services you may have received from other providers in  the past year.     Assessment:    This is a routine wellness examination for Mishti.  Hearing/Vision screen Hearing Screening - Comments:: Hearing loss; wears bilateral hearing aids  Vision Screening - Comments:: Wears rx glasses - up to date with routine eye exams with Dr. Particia Bolus     Goals Addressed             This Visit's Progress    Remain active and independent   On track      Depression Screen     07/07/2023   10:36 AM 07/03/2022   10:47 AM 05/15/2021   11:17 AM 05/10/2020    9:52 AM 04/15/2019    2:47 PM 03/20/2018   10:15 AM 03/05/2017    9:40 AM  PHQ 2/9 Scores  PHQ - 2 Score 0 0 0 0 0 0 0  PHQ- 9 Score    0 0  0    Fall Risk     07/07/2023   10:37 AM 07/03/2022   10:50 AM 03/19/2022    9:43 AM 05/15/2021   11:11 AM 05/10/2020    9:51 AM  Fall Risk   Falls in the past year? 0 0 0 0 0  Number falls in past yr: 0 0 0 0 0  Injury with Fall? 0 0 0 0 0  Risk for fall due to : No Fall Risks No Fall Risks No Fall Risks  Medication side effect  Follow up Falls prevention discussed;Education provided;Falls evaluation completed Falls prevention discussed;Falls evaluation completed Falls evaluation completed Falls evaluation completed;Education provided;Falls prevention discussed Falls evaluation completed;Falls prevention discussed    MEDICARE RISK AT HOME:  Medicare Risk at Home Any stairs in or around the home?: No If so, are there any without handrails?: No Home free of loose throw rugs in walkways, pet beds, electrical cords, etc?: Yes Adequate lighting in your home to reduce risk of falls?: Yes Life alert?: No Use of a cane, walker or w/c?: No Grab bars in the bathroom?: Yes Shower chair or bench in shower?: No Elevated toilet seat or a handicapped toilet?: Yes  TIMED UP AND GO:  Was the test performed?  No  Cognitive Function: 6CIT completed    05/10/2020  9:54 AM 04/15/2019    2:50 PM 03/05/2017    9:25 AM 03/04/2016    8:55 AM  MMSE - Mini Mental  State Exam  Orientation to time 5 5 5 5   Orientation to Place 5 5 5 5   Registration 3 3 3 3   Attention/ Calculation 5 5 0 0  Recall 3 3 3 3   Language- name 2 objects   0 0  Language- repeat 1 1 1 1   Language- follow 3 step command   3 3  Language- read & follow direction   0 0  Write a sentence   0 0  Copy design   0 0  Total score   20 20        07/07/2023   10:37 AM 07/03/2022   10:51 AM 05/15/2021   11:13 AM  6CIT Screen  What Year? 0 points 0 points 0 points  What month? 0 points 0 points 0 points  What time? 0 points 0 points 0 points  Count back from 20 0 points 0 points 0 points  Months in reverse 0 points 0 points 0 points  Repeat phrase 0 points 2 points 0 points  Total Score 0 points 2 points 0 points    Immunizations Immunization History  Administered Date(s) Administered   Fluad Quad(high Dose 65+) 10/07/2018, 10/12/2020, 10/24/2021   Influenza, High Dose Seasonal PF 11/20/2016, 11/30/2019, 12/16/2022   Influenza, Seasonal, Injecte, Preservative Fre 01/14/2012, 11/14/2015   Influenza,inj,Quad PF,6+ Mos 11/24/2012, 12/23/2013, 11/24/2014, 12/18/2017   PFIZER(Purple Top)SARS-COV-2 Vaccination 03/08/2019, 04/02/2019, 11/02/2019   Pneumococcal Conjugate-13 12/23/2013   Pneumococcal Polysaccharide-23 03/31/2003, 03/03/2015   Td 02/04/1973, 02/17/2007   Zoster Recombinant(Shingrix) 11/18/2018, 11/30/2018   Zoster, Live 03/24/2007    Screening Tests Health Maintenance  Topic Date Due   DTaP/Tdap/Td (3 - Tdap) 02/16/2017   Zoster Vaccines- Shingrix (2 of 2) 01/25/2019   COVID-19 Vaccine (4 - 2024-25 season) 10/06/2022   MAMMOGRAM  12/22/2022   DEXA SCAN  08/13/2023   INFLUENZA VACCINE  09/05/2023   Medicare Annual Wellness (AWV)  07/06/2024   Pneumonia Vaccine 69+ Years old  Completed   HPV VACCINES  Aged Out   Meningococcal B Vaccine  Aged Out    Health Maintenance  Health Maintenance Due  Topic Date Due   DTaP/Tdap/Td (3 - Tdap) 02/16/2017   Zoster  Vaccines- Shingrix (2 of 2) 01/25/2019   COVID-19 Vaccine (4 - 2024-25 season) 10/06/2022   MAMMOGRAM  12/22/2022   Health Maintenance Items Addressed: Information provided on Shingrix   Additional Screening:  Vision Screening: Recommended annual ophthalmology exams for early detection of glaucoma and other disorders of the eye. Would you like a referral to an eye doctor? No    Dental Screening: Recommended annual dental exams for proper oral hygiene  Community Resource Referral / Chronic Care Management: CRR required this visit?  No   CCM required this visit?  No   Plan:    I have personally reviewed and noted the following in the patient's chart:   Medical and social history Use of alcohol, tobacco or illicit drugs  Current medications and supplements including opioid prescriptions. Patient is not currently taking opioid prescriptions. Functional ability and status Nutritional status Physical activity Advanced directives List of other physicians Hospitalizations, surgeries, and ER visits in previous 12 months Vitals Screenings to include cognitive, depression, and falls Referrals and appointments  In addition, I have reviewed and discussed with patient certain preventive protocols, quality metrics, and best practice recommendations. A  written personalized care plan for preventive services as well as general preventive health recommendations were provided to patient.   Seabron Cypress [place], [place]   [id number]/10/8117   After Visit Summary: (MyChart) Due to this being a telephonic visit, the after visit summary with patients personalized plan was offered to patient via MyChart   Notes: Nothing significant to report at this time.

## 2023-07-07 NOTE — Patient Instructions (Signed)
 [name] , Thank you for taking time out of your busy schedule to complete your Annual Wellness Visit with me. I enjoyed our conversation and look forward to speaking with you again next year. I, as well as your care team,  appreciate your ongoing commitment to your health goals. Please review the following plan we discussed and let me know if I can assist you in the future. Your Game plan/ To Do List    Follow up Visits: Next Medicare AWV with our clinical staff: In 1 year   Have you seen your provider in the last 6 months (3 months if uncontrolled diabetes)? Yes Next Office Visit with your provider: To be scheduled   Clinician Recommendations:  Aim for 30 minutes of exercise or brisk walking, 6-8 glasses of water , and 5 servings of fruits and vegetables each day.       This is a list of the screening recommended for you and due dates:  Health Maintenance  Topic Date Due   DTaP/Tdap/Td vaccine (3 - Tdap) 02/16/2017   Zoster (Shingles) Vaccine (2 of 2) 01/25/2019   COVID-19 Vaccine (4 - 2024-25 season) 10/06/2022   Mammogram  12/22/2022   DEXA scan (bone density measurement)  08/13/2023   Flu Shot  09/05/2023   Medicare Annual Wellness Visit  07/06/2024   Pneumonia Vaccine  Completed   HPV Vaccine  Aged Out   Meningitis B Vaccine  Aged Out    Advanced directives: (ACP Link)Information on Advanced Care Planning can be found at Insurance risk surveyor Health Care Directives Advance Health Care Directives. [email or web address]   Advance Care Planning is important because it:  [x]  Makes sure you receive the medical care that is consistent with your values, goals, and preferences  [x]  It provides guidance to your family and loved ones and reduces their decisional burden about whether or not they are making the right decisions based on your wishes.  Follow the link provided in your after visit summary or read over the paperwork we have mailed to you to help you started getting  your Advance Directives in place. If you need assistance in completing these, please reach out to us  so that we can help you!  See attachments for Preventive Care and Fall Prevention Tips.

## 2023-10-16 DIAGNOSIS — Z9841 Cataract extraction status, right eye: Secondary | ICD-10-CM | POA: Diagnosis not present

## 2023-10-16 DIAGNOSIS — Z9842 Cataract extraction status, left eye: Secondary | ICD-10-CM | POA: Diagnosis not present

## 2023-10-16 DIAGNOSIS — H52223 Regular astigmatism, bilateral: Secondary | ICD-10-CM | POA: Diagnosis not present

## 2023-10-27 ENCOUNTER — Other Ambulatory Visit: Payer: Medicare Other

## 2023-11-12 ENCOUNTER — Other Ambulatory Visit: Payer: Self-pay | Admitting: Family Medicine

## 2024-02-13 ENCOUNTER — Other Ambulatory Visit: Payer: Self-pay | Admitting: Family Medicine

## 2024-02-13 DIAGNOSIS — E559 Vitamin D deficiency, unspecified: Secondary | ICD-10-CM

## 2024-02-13 DIAGNOSIS — E7800 Pure hypercholesterolemia, unspecified: Secondary | ICD-10-CM

## 2024-02-13 NOTE — Telephone Encounter (Signed)
 Please call and schedule CPE with fasting labs prior with Dr. Avelina after 02/27/2024

## 2024-02-17 ENCOUNTER — Telehealth: Payer: Self-pay | Admitting: Family Medicine

## 2024-02-17 NOTE — Telephone Encounter (Signed)
 Spoke with [employer].  Bone Density Order was placed 02/27/23 for [facility].  They  no longer do Dexa Scans.  I have updated order to [facility] and provided phone number to call and schedule.  Please call and schedule CPE with fasting labs prior with Dr. Avelina after 1/23/026.

## 2024-02-17 NOTE — Telephone Encounter (Signed)
 Copied from CRM [id number]. Topic: Appointments - Scheduling Inquiry for Clinic >> Feb 17, 2024 11:32 AM Thersia BROCKS wrote: Reason for CRM: Patient called in regarding bone density test would like someone to call so she can be scheduled   [phone]  Would like an order placed. thanks

## 2024-02-17 NOTE — Addendum Note (Signed)
 Addended by: WENDELL ARLAND RAMAN on: 02/17/2024 12:31 PM   Modules accepted: Orders

## 2024-02-24 ENCOUNTER — Other Ambulatory Visit (INDEPENDENT_AMBULATORY_CARE_PROVIDER_SITE_OTHER)

## 2024-02-24 ENCOUNTER — Telehealth: Payer: Self-pay | Admitting: *Deleted

## 2024-02-24 ENCOUNTER — Ambulatory Visit: Payer: Self-pay | Admitting: *Deleted

## 2024-02-24 DIAGNOSIS — E559 Vitamin D deficiency, unspecified: Secondary | ICD-10-CM | POA: Diagnosis not present

## 2024-02-24 DIAGNOSIS — E7800 Pure hypercholesterolemia, unspecified: Secondary | ICD-10-CM

## 2024-02-24 LAB — COMPREHENSIVE METABOLIC PANEL WITH GFR
ALT: 15 U/L (ref 3–35)
AST: 19 U/L (ref 5–37)
Albumin: 4 g/dL (ref 3.5–5.2)
Alkaline Phosphatase: 44 U/L (ref 39–117)
BUN: 25 mg/dL — ABNORMAL HIGH (ref 6–23)
CO2: 28 meq/L (ref 19–32)
Calcium: 9.3 mg/dL (ref 8.4–10.5)
Chloride: 103 meq/L (ref 96–112)
Creatinine, Ser: 0.99 mg/dL (ref 0.40–1.20)
GFR: 51.87 mL/min — ABNORMAL LOW
Glucose, Bld: 91 mg/dL (ref 70–99)
Potassium: 3.8 meq/L (ref 3.5–5.1)
Sodium: 138 meq/L (ref 135–145)
Total Bilirubin: 0.6 mg/dL (ref 0.2–1.2)
Total Protein: 6.6 g/dL (ref 6.0–8.3)

## 2024-02-24 LAB — LIPID PANEL
Cholesterol: 195 mg/dL (ref 28–200)
HDL: 49.4 mg/dL
LDL Cholesterol: 128 mg/dL — ABNORMAL HIGH (ref 10–99)
NonHDL: 145.75
Total CHOL/HDL Ratio: 4
Triglycerides: 91 mg/dL (ref 10.0–149.0)
VLDL: 18.2 mg/dL (ref 0.0–40.0)

## 2024-02-24 LAB — VITAMIN D 25 HYDROXY (VIT D DEFICIENCY, FRACTURES): VITD: >120 ng/mL

## 2024-02-24 NOTE — Progress Notes (Signed)
 No critical labs need to be addressed urgently. We will discuss labs in detail at upcoming office visit.

## 2024-02-24 NOTE — Telephone Encounter (Signed)
 Saa with [facility] called with a critical labs on pt.  Pt has a Vitamin D  (greater) >120.  Results given to PCP

## 2024-02-24 NOTE — Telephone Encounter (Addendum)
 [name] notified as instructed by telephone.  He states understanding and will relay message to his wife.

## 2024-03-02 ENCOUNTER — Encounter: Admitting: Family Medicine

## 2024-03-10 ENCOUNTER — Encounter: Payer: Self-pay | Admitting: Family Medicine

## 2024-03-10 ENCOUNTER — Ambulatory Visit: Admitting: Family Medicine

## 2024-03-10 VITALS — BP 130/70 | HR 80 | Temp 97.6°F | Ht 61.5 in | Wt 121.4 lb

## 2024-03-10 DIAGNOSIS — R490 Dysphonia: Secondary | ICD-10-CM

## 2024-03-10 DIAGNOSIS — I10 Essential (primary) hypertension: Secondary | ICD-10-CM

## 2024-03-10 DIAGNOSIS — E673 Hypervitaminosis D: Secondary | ICD-10-CM

## 2024-03-10 DIAGNOSIS — Z0000 Encounter for general adult medical examination without abnormal findings: Secondary | ICD-10-CM

## 2024-03-10 DIAGNOSIS — M8580 Other specified disorders of bone density and structure, unspecified site: Secondary | ICD-10-CM

## 2024-03-10 DIAGNOSIS — E7800 Pure hypercholesterolemia, unspecified: Secondary | ICD-10-CM

## 2024-03-10 NOTE — Assessment & Plan Note (Addendum)
 Chronic,  good control on losartan  hydrochlorothiazide  50/12.5 mg p.o. daily.

## 2024-03-10 NOTE — Assessment & Plan Note (Signed)
 Inadequate control but tolerable given age., patient refuses trial of statin.

## 2024-03-10 NOTE — Progress Notes (Signed)
 "   Patient ID: ARELIA VOLPE, female    DOB: [birth year], 84 y.o.   MRN: [id number]  This visit was conducted in person.  BP 130/70   Pulse 80   Temp 97.6 F (36.4 C) (Oral)   Ht 5' 1.5 (1.562 m)   Wt 121 lb 6 oz (55.1 kg)   SpO2 97%   BMI 22.56 kg/m    CC:  Chief Complaint  Patient presents with   Annual Exam    Subjective:   HPI: LUCYLE ALUMBAUGH is a 84 y.o. female presenting on 03/10/2024 for Annual Exam  The patient presents for  complete physical and review of chronic health problems. He/She also has the following acute concerns today:   Chronic post nasal drip, hoarse voice... going on for years.  Mucus in throat. No reflux noted.   Had issues as child with allergies.   No sneeze, runny nose.   Vit D high.. on supplement.. has stopped in last 2 weeks.  The patient saw a LPN or RN for medicare wellness visit. 07/2023  Prevention and wellness was reviewed in detail. Note reviewed and important notes copied below.  Hypertension:  Previously well controlled on losartan  hydrochlorothiazide  50/12.5 mg p.o. daily. BP Readings from Last 3 Encounters:  03/10/24 130/70  02/27/23 (!) 150/82  04/16/22 124/76  Using medication without problems or lightheadedness:  none Chest pain with exertion: none Edema:none Short of breath: none Average home BPs: not checking lately Other issues:  Elevated Cholesterol: Inadequate control but tolerable given age., patient refuses trial of statin.   She feels red yeast rice may have casue body ache.. so stopped. Lab Results  Component Value Date   CHOL 195 02/24/2024   HDL 49.40 02/24/2024   LDLCALC 128 (H) 02/24/2024   LDLDIRECT 148.1 11/17/2012   TRIG 91.0 02/24/2024   CHOLHDL 4 02/24/2024  Using medications without problems: Muscle aches:  Diet compliance: poor Exercise: walking some when weather not cold. Other complaints:  Wt Readings from Last 3 Encounters:  03/10/24 121 lb 6 oz (55.1 kg)  07/07/23 120 lb (54.4 kg)   02/27/23 120 lb 8 oz (54.7 kg)  Body mass index is 22.56 kg/m.       Hx of endometrial cancer Dx 2017: Stage 1A Grade 2 endometrial cancer S/p robotic assisted total hysterectomy, BSO, sentinel lymph node mapping on 06/13/15. no LVSI, 40% myometrial invasion, negative lymph nodes.  Followed in past  by Dr. Eloy. Surgeon Dr. Jerrol. Released.  History of breast cancer,   2005  Relevant past medical, surgical, family and social history reviewed and updated as indicated. Interim medical history since our last visit reviewed. Allergies and medications reviewed and updated. Outpatient Medications Prior to Visit  Medication Sig Dispense Refill   aspirin  EC 81 MG tablet Take 81 mg by mouth daily. Swallow whole.     Cholecalciferol 1000 UNITS capsule Take 1,000 Units by mouth 2 (two) times daily.     Cyanocobalamin (B-12 PO) Take 1 tablet by mouth daily.      fluticasone  (FLONASE ) 50 MCG/ACT nasal spray Place 2 sprays into both nostrils daily. (Patient taking differently: Place 2 sprays into both nostrils daily as needed for allergies.) 16 g 11   FLUZONE HIGH-DOSE 0.5 ML injection      losartan -hydrochlorothiazide  (HYZAAR) 50-12.5 MG tablet TAKE 1 TABLET BY MOUTH DAILY 90 tablet 0   sodium fluoride-calcium carbonate (FLORICAL) 8.3-364 MG CAPS Take 1 capsule by mouth 2 (two) times daily.  No facility-administered medications prior to visit.     Per HPI unless specifically indicated in ROS section below Review of Systems  Constitutional:  Negative for fatigue and fever.  HENT:  Positive for postnasal drip. Negative for congestion.   Eyes:  Negative for pain.  Respiratory:  Negative for cough and shortness of breath.   Cardiovascular:  Negative for chest pain, palpitations and leg swelling.  Gastrointestinal:  Negative for abdominal pain.  Genitourinary:  Negative for dysuria and vaginal bleeding.  Musculoskeletal:  Negative for back pain.  Neurological:  Negative for syncope,  light-headedness and headaches.  Psychiatric/Behavioral:  Negative for dysphoric mood.    Objective:  BP 130/70   Pulse 80   Temp 97.6 F (36.4 C) (Oral)   Ht 5' 1.5 (1.562 m)   Wt 121 lb 6 oz (55.1 kg)   SpO2 97%   BMI 22.56 kg/m   Wt Readings from Last 3 Encounters:  03/10/24 121 lb 6 oz (55.1 kg)  07/07/23 120 lb (54.4 kg)  02/27/23 120 lb 8 oz (54.7 kg)      Physical Exam Vitals and nursing note reviewed.  Constitutional:      General: She is not in acute distress.    Appearance: Normal appearance. She is well-developed. She is not ill-appearing or toxic-appearing.  HENT:     Head: Normocephalic.     Right Ear: Hearing, ear canal and external ear normal. No middle ear effusion. There is no impacted cerumen.     Left Ear: Hearing, tympanic membrane, ear canal and external ear normal.  No middle ear effusion. There is no impacted cerumen.     Nose: Nose normal.  Eyes:     General: Lids are normal. Lids are everted, no foreign bodies appreciated.     Conjunctiva/sclera: Conjunctivae normal.     Pupils: Pupils are equal, round, and reactive to light.  Neck:     Thyroid: No thyroid mass or thyromegaly.     Vascular: No carotid bruit.     Trachea: Trachea normal.  Cardiovascular:     Rate and Rhythm: Normal rate and regular rhythm.     Heart sounds: Normal heart sounds, S1 normal and S2 normal. No murmur heard.    No gallop.  Pulmonary:     Effort: Pulmonary effort is normal. No respiratory distress.     Breath sounds: Normal breath sounds. No wheezing, rhonchi or rales.  Abdominal:     General: Bowel sounds are normal. There is no distension or abdominal bruit.     Palpations: Abdomen is soft. There is no fluid wave or mass.     Tenderness: There is no abdominal tenderness. There is no guarding or rebound.     Hernia: No hernia is present.  Musculoskeletal:     Cervical back: Normal range of motion and neck supple.  Lymphadenopathy:     Cervical: No cervical  adenopathy.  Skin:    General: Skin is warm and dry.     Findings: No rash.  Neurological:     Mental Status: She is alert.     Cranial Nerves: No cranial nerve deficit.     Sensory: No sensory deficit.  Psychiatric:        Mood and Affect: Mood is not anxious or depressed.        Speech: Speech normal.        Behavior: Behavior normal. Behavior is cooperative.        Judgment: Judgment normal.  Results for orders placed or performed in visit on 02/24/24  Comprehensive metabolic panel   Collection Time: 02/24/24  9:08 AM  Result Value Ref Range   Sodium 138 135 - 145 mEq/L   Potassium 3.8 3.5 - 5.1 mEq/L   Chloride 103 96 - 112 mEq/L   CO2 28 19 - 32 mEq/L   Glucose, Bld 91 70 - 99 mg/dL   BUN 25 (H) 6 - 23 mg/dL   Creatinine, Ser 9.00 0.40 - 1.20 mg/dL   Total Bilirubin 0.6 0.2 - 1.2 mg/dL   Alkaline Phosphatase 44 39 - 117 U/L   AST 19 5 - 37 U/L   ALT 15 3 - 35 U/L   Total Protein 6.6 6.0 - 8.3 g/dL   Albumin 4.0 3.5 - 5.2 g/dL   GFR 48.12 (L) >39.99 mL/min   Calcium 9.3 8.4 - 10.5 mg/dL  Lipid panel   Collection Time: 02/24/24  9:08 AM  Result Value Ref Range   Cholesterol 195 28 - 200 mg/dL   Triglycerides 08.9 89.9 - 149.0 mg/dL   HDL 50.59 >60.99 mg/dL   VLDL 81.7 0.0 - 59.9 mg/dL   LDL Cholesterol 871 (H) 10 - 99 mg/dL   Total CHOL/HDL Ratio 4    NonHDL 145.75   VITAMIN D  25 Hydroxy (Vit-D Deficiency, Fractures)   Collection Time: 02/24/24  9:08 AM  Result Value Ref Range   VITD >120 (HH) ng/mL     COVID 19 screen:  No recent travel or known exposure to COVID19 The patient denies respiratory symptoms of COVID 19 at this time. The importance of social distancing was discussed today.   Assessment and Plan The patient's preventative maintenance and recommended screening tests for an annual wellness exam were reviewed in full today. Brought up to date unless services declined.  Counselled on the importance of diet, exercise, and its role in overall  health and mortality. The patient's FH and SH was reviewed, including their home life, tobacco status, and drug and alcohol status.    TAH Hx of endometrial cancer Vaccines: uptodate Shingrix, PNV, flu.COVID vaccine x 3  consider Tdap,  Colonoscopy: 06/2005,  not indicated due to age Mammo:  negative 12/2021, hx of breast cancer 20 years ago... plan q 2 years. DEXA:  08/2018:stable osteopenia..  DUE Non smoker.  Problem List Items Addressed This Visit     Essential hypertension, benign (Chronic)   Chronic,  good control on losartan  hydrochlorothiazide  50/12.5 mg p.o. daily.        Relevant Medications   aspirin  EC 81 MG tablet   HYPERCHOLESTEROLEMIA (Chronic)   Inadequate control but tolerable given age., patient refuses trial of statin.        Relevant Medications   aspirin  EC 81 MG tablet   Osteopenia   Relevant Orders   DG Bone Density   Other Visit Diagnoses       Routine general medical examination at a health care facility    -  Primary     Hoarse voice quality         High vitamin D  level       Relevant Orders   VITAMIN D  25 Hydroxy (Vit-D Deficiency, Fractures)         .  Greig Ring, MD   "

## 2024-04-14 ENCOUNTER — Other Ambulatory Visit

## 2024-04-21 ENCOUNTER — Other Ambulatory Visit

## 2024-07-07 ENCOUNTER — Ambulatory Visit
